# Patient Record
Sex: Female | Born: 1965 | Race: White | Hispanic: No | Marital: Single | Smoking: Never smoker
Health system: Southern US, Community
[De-identification: ages and names within clinical notes are randomized; demographics above are authoritative.]

## PROBLEM LIST (undated history)

## (undated) DIAGNOSIS — C50919 Malignant neoplasm of unspecified site of unspecified female breast: Secondary | ICD-10-CM

## (undated) DIAGNOSIS — G43909 Migraine, unspecified, not intractable, without status migrainosus: Secondary | ICD-10-CM

## (undated) DIAGNOSIS — I10 Essential (primary) hypertension: Secondary | ICD-10-CM

## (undated) DIAGNOSIS — F419 Anxiety disorder, unspecified: Secondary | ICD-10-CM

## (undated) DIAGNOSIS — R011 Cardiac murmur, unspecified: Secondary | ICD-10-CM

## (undated) DIAGNOSIS — K219 Gastro-esophageal reflux disease without esophagitis: Secondary | ICD-10-CM

## (undated) DIAGNOSIS — Z973 Presence of spectacles and contact lenses: Secondary | ICD-10-CM

## (undated) DIAGNOSIS — N201 Calculus of ureter: Secondary | ICD-10-CM

## (undated) DIAGNOSIS — Z9889 Other specified postprocedural states: Secondary | ICD-10-CM

## (undated) DIAGNOSIS — R112 Nausea with vomiting, unspecified: Secondary | ICD-10-CM

## (undated) DIAGNOSIS — Z923 Personal history of irradiation: Secondary | ICD-10-CM

## (undated) HISTORY — DX: Essential (primary) hypertension: I10

## (undated) HISTORY — DX: Migraine, unspecified, not intractable, without status migrainosus: G43.909

## (undated) HISTORY — PX: BASAL CELL CARCINOMA EXCISION: SHX1214

## (undated) HISTORY — DX: Malignant neoplasm of unspecified site of unspecified female breast: C50.919

## (undated) HISTORY — PX: COLONOSCOPY: SHX174

---

## 1999-08-15 ENCOUNTER — Other Ambulatory Visit: Admission: RE | Admit: 1999-08-15 | Discharge: 1999-08-15 | Payer: Self-pay | Admitting: Obstetrics and Gynecology

## 2000-09-24 ENCOUNTER — Other Ambulatory Visit: Admission: RE | Admit: 2000-09-24 | Discharge: 2000-09-24 | Payer: Self-pay | Admitting: Obstetrics and Gynecology

## 2001-10-13 ENCOUNTER — Other Ambulatory Visit: Admission: RE | Admit: 2001-10-13 | Discharge: 2001-10-13 | Payer: Self-pay | Admitting: Obstetrics and Gynecology

## 2002-12-01 ENCOUNTER — Other Ambulatory Visit: Admission: RE | Admit: 2002-12-01 | Discharge: 2002-12-01 | Payer: Self-pay | Admitting: Obstetrics and Gynecology

## 2003-12-24 ENCOUNTER — Other Ambulatory Visit: Admission: RE | Admit: 2003-12-24 | Discharge: 2003-12-24 | Payer: Self-pay | Admitting: Obstetrics and Gynecology

## 2005-02-25 ENCOUNTER — Other Ambulatory Visit: Admission: RE | Admit: 2005-02-25 | Discharge: 2005-02-25 | Payer: Self-pay | Admitting: Obstetrics and Gynecology

## 2011-10-27 ENCOUNTER — Other Ambulatory Visit: Payer: Self-pay | Admitting: Family Medicine

## 2011-10-27 DIAGNOSIS — R1013 Epigastric pain: Secondary | ICD-10-CM

## 2011-10-29 ENCOUNTER — Ambulatory Visit
Admission: RE | Admit: 2011-10-29 | Discharge: 2011-10-29 | Disposition: A | Discharge status: Home / Self Care | Payer: BC Managed Care – PPO | Source: Ambulatory Visit | Attending: Family Medicine | Admitting: Family Medicine

## 2011-10-29 DIAGNOSIS — R1013 Epigastric pain: Secondary | ICD-10-CM

## 2015-11-28 ENCOUNTER — Encounter (HOSPITAL_COMMUNITY): Payer: Self-pay | Admitting: Emergency Medicine

## 2015-11-28 ENCOUNTER — Emergency Department (HOSPITAL_COMMUNITY)
Admission: EM | Admit: 2015-11-28 | Discharge: 2015-11-29 | Disposition: A | Discharge status: Home / Self Care | Payer: BC Managed Care – PPO | Attending: Emergency Medicine | Admitting: Emergency Medicine

## 2015-11-28 DIAGNOSIS — Z3202 Encounter for pregnancy test, result negative: Secondary | ICD-10-CM | POA: Diagnosis not present

## 2015-11-28 DIAGNOSIS — K7689 Other specified diseases of liver: Secondary | ICD-10-CM | POA: Insufficient documentation

## 2015-11-28 DIAGNOSIS — N201 Calculus of ureter: Secondary | ICD-10-CM | POA: Insufficient documentation

## 2015-11-28 DIAGNOSIS — R1031 Right lower quadrant pain: Secondary | ICD-10-CM | POA: Diagnosis present

## 2015-11-28 LAB — DIFFERENTIAL
BASOS ABS: 0 10*3/uL (ref 0.0–0.1)
Basophils Relative: 0 %
EOS ABS: 0.5 10*3/uL (ref 0.0–0.7)
EOS PCT: 4 %
LYMPHS ABS: 2.1 10*3/uL (ref 0.7–4.0)
Lymphocytes Relative: 18 %
Monocytes Absolute: 0.5 10*3/uL (ref 0.1–1.0)
Monocytes Relative: 4 %
NEUTROS PCT: 74 %
Neutro Abs: 8.2 10*3/uL — ABNORMAL HIGH (ref 1.7–7.7)

## 2015-11-28 LAB — CBC
HEMATOCRIT: 37.2 % (ref 36.0–46.0)
HEMOGLOBIN: 12.4 g/dL (ref 12.0–15.0)
MCH: 29.9 pg (ref 26.0–34.0)
MCHC: 33.3 g/dL (ref 30.0–36.0)
MCV: 89.6 fL (ref 78.0–100.0)
Platelets: 255 10*3/uL (ref 150–400)
RBC: 4.15 MIL/uL (ref 3.87–5.11)
RDW: 12.1 % (ref 11.5–15.5)
WBC: 11.2 10*3/uL — AB (ref 4.0–10.5)

## 2015-11-28 LAB — POC URINE PREG, ED: PREG TEST UR: NEGATIVE

## 2015-11-28 MED ORDER — SODIUM CHLORIDE 0.9 % IV SOLN
1000.0000 mL | Freq: Once | INTRAVENOUS | Status: AC
  Administered 2015-11-29: 1000 mL via INTRAVENOUS

## 2015-11-28 MED ORDER — ONDANSETRON HCL 4 MG/2ML IJ SOLN
4.0000 mg | Freq: Once | INTRAMUSCULAR | Status: AC
  Administered 2015-11-29: 4 mg via INTRAVENOUS
  Filled 2015-11-28: qty 2

## 2015-11-28 MED ORDER — SODIUM CHLORIDE 0.9 % IV SOLN
1000.0000 mL | INTRAVENOUS | Status: DC
Start: 2015-11-29 — End: 2015-11-29
  Administered 2015-11-29: 1000 mL via INTRAVENOUS

## 2015-11-28 NOTE — ED Provider Notes (Signed)
CSN: [id number]     Arrival date & time 11/28/15  2316 History   By signing my name below, I, Forrestine Him, attest that this documentation has been prepared under the direction and in the presence of Delora Fuel, MD.  Electronically Signed: Forrestine Him, ED Scribe. 11/28/2015. 12:00 AM.   Chief Complaint  Patient presents with  . Abdominal Pain   The history is provided by the patient. No language interpreter was used.    HPI Comments: Wanda Castro is a 50 y.o. female without any pertinent past medical history who presents to the Emergency Department complaining of constant, ongoing RLQ abdominal pain that intermittently radiates to the back onset 9:00 PM this evening. Pt unable to describe pain but rates it 8-9/10. Pt also reports nausea and 4 episodes of vomiting since time of onset of abdominal pain. No aggravating factors on this time. However, she states abdominal discomfort is mildly improved after vomiting. No OTC medications or home remedies attempted prior to arrival. However, pt states she attempted to use a suppository but says she still has not been able to have a bowel movement. No fever, chills, diarrhea, chest pain, or shortness of breath. She is unsure of LNMP as she is currently going through menopause. No known allergies to medications.  PCP: Bevelyn Buckles, MD- [place]  History reviewed. No pertinent past medical history. History reviewed. No pertinent past surgical history. No family history on file. Social History  Substance Use Topics  . Smoking status: Never Smoker   . Smokeless tobacco: None  . Alcohol Use: No   OB History    No data available     Review of Systems  Constitutional: Negative for fever and chills.  Respiratory: Negative for shortness of breath.   Cardiovascular: Negative for chest pain.  Gastrointestinal: Positive for nausea, vomiting and abdominal pain.  Neurological: Negative for headaches.  Psychiatric/Behavioral:  Negative for confusion.  All other systems reviewed and are negative.     Allergies  Review of patient's allergies indicates no known allergies.  Home Medications   Prior to Admission medications   Not on File   Triage Vitals: BP 115/97 mmHg  Pulse 96  Temp(Src) 98.7 F (37.1 C) (Oral)  Resp 16  Wt 163 lb 4.8 oz (74.072 kg)  SpO2 96%   Physical Exam  Constitutional: She is oriented to person, place, and time. She appears well-developed and well-nourished. No distress.  Appears uncomfortable   HENT:  Head: Normocephalic and atraumatic.  Eyes: EOM are normal. Pupils are equal, round, and reactive to light.  Neck: Normal range of motion. Neck supple. No JVD present.  Cardiovascular: Normal rate, regular rhythm and normal heart sounds.   No murmur heard. Pulmonary/Chest: Effort normal and breath sounds normal. She has no wheezes. She has no rales. She exhibits no tenderness.  Abdominal: Soft. She exhibits no distension and no mass. There is tenderness. There is no rebound and no guarding.  Mild R suprapubic tenderness Bowel sounds decreased  Genitourinary:  No CVA tenderness noted   Musculoskeletal: Normal range of motion. She exhibits no edema.  Lymphadenopathy:    She has no cervical adenopathy.  Neurological: She is alert and oriented to person, place, and time. No cranial nerve deficit. She exhibits normal muscle tone. Coordination normal.  Skin: Skin is warm and dry. No rash noted.  Psychiatric: She has a normal mood and affect. Her behavior is normal. Judgment and thought content normal.  Nursing note and vitals reviewed.  ED Course  Procedures (including critical care time)  DIAGNOSTIC STUDIES: Oxygen Saturation is 96% on RA, adequate by my interpretation.    COORDINATION OF CARE: 11:57 PM- Will order imaging, blood work, and urine pregnancy. Will give fluids and Zofran. Discussed treatment plan with pt at bedside and pt agreed to plan.     Labs  Review Results for orders placed or performed during the hospital encounter of 11/28/15  Lipase, blood  Result Value Ref Range   Lipase 19 11 - 51 U/L  Comprehensive metabolic panel  Result Value Ref Range   Sodium 139 135 - 145 mmol/L   Potassium 3.3 (L) 3.5 - 5.1 mmol/L   Chloride 101 101 - 111 mmol/L   CO2 25 22 - 32 mmol/L   Glucose, Bld 156 (H) 65 - 99 mg/dL   BUN 17 6 - 20 mg/dL   Creatinine, Ser 1.00 0.44 - 1.00 mg/dL   Calcium 9.1 8.9 - 10.3 mg/dL   Total Protein 6.7 6.5 - 8.1 g/dL   Albumin 3.9 3.5 - 5.0 g/dL   AST 22 15 - 41 U/L   ALT 17 14 - 54 U/L   Alkaline Phosphatase 70 38 - 126 U/L   Total Bilirubin 0.7 0.3 - 1.2 mg/dL   GFR calc non Af Amer >60 >60 mL/min   GFR calc Af Amer >60 >60 mL/min   Anion gap 13 5 - 15  CBC  Result Value Ref Range   WBC 11.2 (H) 4.0 - 10.5 K/uL   RBC 4.15 3.87 - 5.11 MIL/uL   Hemoglobin 12.4 12.0 - 15.0 g/dL   HCT 37.2 36.0 - 46.0 %   MCV 89.6 78.0 - 100.0 fL   MCH 29.9 26.0 - 34.0 pg   MCHC 33.3 30.0 - 36.0 g/dL   RDW 12.1 11.5 - 15.5 %   Platelets 255 150 - 400 K/uL  Urinalysis, Routine w reflex microscopic  Result Value Ref Range   Color, Urine YELLOW YELLOW   APPearance CLEAR CLEAR   Specific Gravity, Urine 1.026 1.005 - 1.030   pH 5.5 5.0 - 8.0   Glucose, UA NEGATIVE NEGATIVE mg/dL   Hgb urine dipstick NEGATIVE NEGATIVE   Bilirubin Urine NEGATIVE NEGATIVE   Ketones, ur 15 (A) NEGATIVE mg/dL   Protein, ur NEGATIVE NEGATIVE mg/dL   Nitrite NEGATIVE NEGATIVE   Leukocytes, UA NEGATIVE NEGATIVE  Differential  Result Value Ref Range   Neutrophils Relative % 74 %   Neutro Abs 8.2 (H) 1.7 - 7.7 K/uL   Lymphocytes Relative 18 %   Lymphs Abs 2.1 0.7 - 4.0 K/uL   Monocytes Relative 4 %   Monocytes Absolute 0.5 0.1 - 1.0 K/uL   Eosinophils Relative 4 %   Eosinophils Absolute 0.5 0.0 - 0.7 K/uL   Basophils Relative 0 %   Basophils Absolute 0.0 0.0 - 0.1 K/uL  POC urine preg, ED  Result Value Ref Range   Preg Test, Ur  NEGATIVE NEGATIVE   Imaging Review Ct Abdomen Pelvis W Contrast  11/29/2015  CLINICAL DATA:  50 year old female with right lower quadrant abdominal pain. EXAM: CT ABDOMEN AND PELVIS WITH CONTRAST TECHNIQUE: Multidetector CT imaging of the abdomen and pelvis was performed using the standard protocol following bolus administration of intravenous contrast. CONTRAST:  100 cc Isovue-300 COMPARISON:  Renal ultrasound dated 10/07/2007 FINDINGS: Minimal bibasilar dependent atelectatic changes of the lungs. The visualized lung bases are otherwise clear. There is no intra-abdominal free air or free fluid. A 1.5 cm  left hepatic hypodense lesion, incompletely characterized, most likely a cyst or hemangioma. MRI may provide better characterization. This is the liver is otherwise unremarkable. The gallbladder,Pancreas, spleen, and the adrenal glands appear unremarkable. Set There is a 3 mm distal right ureteral calculus with mild right hydronephrosis. There is asymmetric delayed excretion of contrast by the right kidney. The left kidney is unremarkable. The visualized left ureter, and urinary bladder appear unremarkable. The uterus is anteverted and grossly unremarkable. The visualized left ovary is grossly unremarkable. There is no evidence of bowel obstruction or active inflammation. Normal appendix. The abdominal aorta and IVC appear unremarkable. No portal venous gas identified. There is no adenopathy. There is a small fat containing umbilical hernia. The abdominal wall soft tissues are otherwise unremarkable. The osseous structures are intact. IMPRESSION: A 3 mm distal right ureteral stone with mild right hydronephrosis. Electronically Signed   By: Anner Crete M.D.   On: 11/29/2015 01:10   I have personally reviewed and evaluated these images and lab results as part of my medical decision-making.   MDM   Final diagnoses:  Ureterolithiasis  Hepatic cyst    Right lower quadrant pain of uncertain cause.  Patient is postmenopausal, so ovarian cyst and torsion are very unlikely. Diagnostic possibilities include appendicitis, renal colic, diverticulitis. She'll be given IV hydration, morphine, ondansetron and sent for CT of abdomen and pelvis. Old records are reviewed and she has no relevant past visits.  She had moderate relief of pain with morphine. CT shows a 3 mm distal right ureteral calculus with mild hydronephrosis which clearly is the cause of her symptoms. She is given a dose of ketorolac with further improvement in pain. Incidental finding of hepatic cysts. Note is made of radiologist stating MRI can further characterize the cyst. At this point, do not feel this is emergent and she is referred back to her primary care provider. She is referred to urology for follow-up for her kidney stone. She is discharged with prescriptions for oxycodone have acetaminophen, ondansetron, tamsulosin and advised to take over-the-counter naproxen.  I personally performed the services described in this documentation, which was scribed in my presence. The recorded information has been reviewed and is accurate.      Delora Fuel, [place] [id number]

## 2015-11-28 NOTE — ED Notes (Signed)
Pt. reports RLQ pain with nausea and emesis onset 9 pm this evening , denies dysuria or fever .

## 2015-11-29 ENCOUNTER — Emergency Department (HOSPITAL_COMMUNITY): Payer: BC Managed Care – PPO

## 2015-11-29 LAB — COMPREHENSIVE METABOLIC PANEL
ALBUMIN: 3.9 g/dL (ref 3.5–5.0)
ALK PHOS: 70 U/L (ref 38–126)
ALT: 17 U/L (ref 14–54)
ANION GAP: 13 (ref 5–15)
AST: 22 U/L (ref 15–41)
BILIRUBIN TOTAL: 0.7 mg/dL (ref 0.3–1.2)
BUN: 17 mg/dL (ref 6–20)
CALCIUM: 9.1 mg/dL (ref 8.9–10.3)
CO2: 25 mmol/L (ref 22–32)
CREATININE: 1 mg/dL (ref 0.44–1.00)
Chloride: 101 mmol/L (ref 101–111)
GFR calc Af Amer: >60 mL/min (ref >60)
GFR calc non Af Amer: >60 mL/min (ref >60)
GLUCOSE: 156 mg/dL — AB (ref 65–99)
POTASSIUM: 3.3 mmol/L — AB (ref 3.5–5.1)
SODIUM: 139 mmol/L (ref 135–145)
Total Protein: 6.7 g/dL (ref 6.5–8.1)

## 2015-11-29 LAB — URINALYSIS, ROUTINE W REFLEX MICROSCOPIC
BILIRUBIN URINE: NEGATIVE
GLUCOSE, UA: NEGATIVE mg/dL
Hgb urine dipstick: NEGATIVE
KETONES UR: 15 mg/dL — AB
Leukocytes, UA: NEGATIVE
Nitrite: NEGATIVE
PH: 5.5 (ref 5.0–8.0)
Protein, ur: NEGATIVE mg/dL
Specific Gravity, Urine: 1.026 (ref 1.005–1.030)

## 2015-11-29 LAB — LIPASE, BLOOD: Lipase: 19 U/L (ref 11–51)

## 2015-11-29 MED ORDER — MORPHINE SULFATE (PF) 4 MG/ML IV SOLN
4.0000 mg | INTRAVENOUS | Status: DC | PRN
  Administered 2015-11-29 (×2): 4 mg via INTRAVENOUS
  Filled 2015-11-29 (×2): qty 1

## 2015-11-29 MED ORDER — IOPAMIDOL (ISOVUE-300) INJECTION 61%
INTRAVENOUS | Status: AC
  Administered 2015-11-29: 100 mL
  Filled 2015-11-29: qty 100

## 2015-11-29 MED ORDER — TAMSULOSIN HCL 0.4 MG PO CAPS: 0.4000 mg | ORAL_CAPSULE | Freq: Every day | ORAL | Status: DC

## 2015-11-29 MED ORDER — OXYCODONE-ACETAMINOPHEN 5-325 MG PO TABS: 1.0000 | ORAL_TABLET | ORAL | Status: DC | PRN

## 2015-11-29 MED ORDER — ONDANSETRON HCL 4 MG PO TABS: 4.0000 mg | ORAL_TABLET | Freq: Four times a day (QID) | ORAL | Status: DC | PRN

## 2015-11-29 MED ORDER — KETOROLAC TROMETHAMINE 30 MG/ML IJ SOLN
30.0000 mg | Freq: Once | INTRAMUSCULAR | Status: AC
  Administered 2015-11-29: 30 mg via INTRAVENOUS
  Filled 2015-11-29: qty 1

## 2015-11-29 NOTE — Discharge Instructions (Signed)
Take to naproxen tablets twice a day.  Your CT scan showed a cyst on your liver. This is most likely benign. Please talk with your primary care provider regarding whether any additional testing should be done.  Kidney Stones Kidney stones (urolithiasis) are deposits that form inside your kidneys. The intense pain is caused by the stone moving through the urinary tract. When the stone moves, the ureter goes into spasm around the stone. The stone is usually passed in the urine.  CAUSES   A disorder that makes certain neck glands produce too much parathyroid hormone (primary hyperparathyroidism).  A buildup of uric acid crystals, similar to gout in your joints.  Narrowing (stricture) of the ureter.  A kidney obstruction present at birth (congenital obstruction).  Previous surgery on the kidney or ureters.  Numerous kidney infections. SYMPTOMS   Feeling sick to your stomach (nauseous).  Throwing up (vomiting).  Blood in the urine (hematuria).  Pain that usually spreads (radiates) to the groin.  Frequency or urgency of urination. DIAGNOSIS   Taking a history and physical exam.  Blood or urine tests.  CT scan.  Occasionally, an examination of the inside of the urinary bladder (cystoscopy) is performed. TREATMENT   Observation.  Increasing your fluid intake.  Extracorporeal shock wave lithotripsy--This is a noninvasive procedure that uses shock waves to break up kidney stones.  Surgery may be needed if you have severe pain or persistent obstruction. There are various surgical procedures. Most of the procedures are performed with the use of small instruments. Only small incisions are needed to accommodate these instruments, so recovery time is minimized. The size, location, and chemical composition are all important variables that will determine the proper choice of action for you. Talk to your health care provider to better understand your situation so that you will minimize  the risk of injury to yourself and your kidney.  HOME CARE INSTRUCTIONS   Drink enough water and fluids to keep your urine clear or pale yellow. This will help you to pass the stone or stone fragments.  Strain all urine through the provided strainer. Keep all particulate matter and stones for your health care provider to see. The stone causing the pain may be as small as a grain of salt. It is very important to use the strainer each and every time you pass your urine. The collection of your stone will allow your health care provider to analyze it and verify that a stone has actually passed. The stone analysis will often identify what you can do to reduce the incidence of recurrences.  Only take over-the-counter or prescription medicines for pain, discomfort, or fever as directed by your health care provider.  Keep all follow-up visits as told by your health care provider. This is important.  Get follow-up X-rays if required. The absence of pain does not always mean that the stone has passed. It may have only stopped moving. If the urine remains completely obstructed, it can cause loss of kidney function or even complete destruction of the kidney. It is your responsibility to make sure X-rays and follow-ups are completed. Ultrasounds of the kidney can show blockages and the status of the kidney. Ultrasounds are not associated with any radiation and can be performed easily in a matter of minutes.  Make changes to your daily diet as told by your health care provider. You may be told to:  Limit the amount of salt that you eat.  Eat 5 or more servings of fruits and  vegetables each day.  Limit the amount of meat, poultry, fish, and eggs that you eat.  Collect a 24-hour urine sample as told by your health care provider.You may need to collect another urine sample every 6-12 months. SEEK MEDICAL CARE IF:  You experience pain that is progressive and unresponsive to any pain medicine you have been  prescribed. SEEK IMMEDIATE MEDICAL CARE IF:   Pain cannot be controlled with the prescribed medicine.  You have a fever or shaking chills.  The severity or intensity of pain increases over 18 hours and is not relieved by pain medicine.  You develop a new onset of abdominal pain.  You feel faint or pass out.  You are unable to urinate.   This information is not intended to replace advice given to you by your health care provider. Make sure you discuss any questions you have with your health care provider.   Document Released: 06/29/2005 Document Revised: 03/20/2015 Document Reviewed: 11/30/2012 Elsevier Interactive Patient Education 2016 [employer]  Acetaminophen; Oxycodone tablets What is this medicine? ACETAMINOPHEN; OXYCODONE (a set a MEE noe fen; ox i KOE done) is a pain reliever. It is used to treat moderate to severe pain. This medicine may be used for other purposes; ask your health care provider or pharmacist if you have questions. What should I tell my health care provider before I take this medicine? They need to know if you have any of these conditions: -brain tumor -Crohn's disease, inflammatory bowel disease, or ulcerative colitis -drug abuse or addiction -head injury -heart or circulation problems -if you often drink alcohol -kidney disease or problems going to the bathroom -liver disease -lung disease, asthma, or breathing problems -an unusual or allergic reaction to acetaminophen, oxycodone, other opioid analgesics, other medicines, foods, dyes, or preservatives -pregnant or trying to get pregnant -breast-feeding How should I use this medicine? Take this medicine by mouth with a full glass of water. Follow the directions on the prescription label. You can take it with or without food. If it upsets your stomach, take it with food. Take your medicine at regular intervals. Do not take it more often than directed. Talk to your pediatrician regarding the use of  this medicine in children. Special care may be needed. Patients over 50 years old may have a stronger reaction and need a smaller dose. Overdosage: If you think you have taken too much of this medicine contact a poison control center or emergency room at once. NOTE: This medicine is only for you. Do not share this medicine with others. What if I miss a dose? If you miss a dose, take it as soon as you can. If it is almost time for your next dose, take only that dose. Do not take double or extra doses. What may interact with this medicine? -alcohol -antihistamines -barbiturates like amobarbital, butalbital, butabarbital, methohexital, pentobarbital, phenobarbital, thiopental, and secobarbital -benztropine -drugs for bladder problems like solifenacin, trospium, oxybutynin, tolterodine, hyoscyamine, and methscopolamine -drugs for breathing problems like ipratropium and tiotropium -drugs for certain stomach or intestine problems like propantheline, homatropine methylbromide, glycopyrrolate, atropine, belladonna, and dicyclomine -general anesthetics like etomidate, ketamine, nitrous oxide, propofol, desflurane, enflurane, halothane, isoflurane, and sevoflurane -medicines for depression, anxiety, or psychotic disturbances -medicines for sleep -muscle relaxants -naltrexone -narcotic medicines (opiates) for pain -phenothiazines like perphenazine, thioridazine, chlorpromazine, mesoridazine, fluphenazine, prochlorperazine, promazine, and trifluoperazine -scopolamine -tramadol -trihexyphenidyl This list may not describe all possible interactions. Give your health care provider a list of all the medicines, herbs, non-prescription drugs,  or dietary supplements you use. Also tell them if you smoke, drink alcohol, or use illegal drugs. Some items may interact with your medicine. What should I watch for while using this medicine? Tell your doctor or health care professional if your pain does not go away, if  it gets worse, or if you have new or a different type of pain. You may develop tolerance to the medicine. Tolerance means that you will need a higher dose of the medication for pain relief. Tolerance is normal and is expected if you take this medicine for a long time. Do not suddenly stop taking your medicine because you may develop a severe reaction. Your body becomes used to the medicine. This does NOT mean you are addicted. Addiction is a behavior related to getting and using a drug for a non-medical reason. If you have pain, you have a medical reason to take pain medicine. Your doctor will tell you how much medicine to take. If your doctor wants you to stop the medicine, the dose will be slowly lowered over time to avoid any side effects. You may get drowsy or dizzy. Do not drive, use machinery, or do anything that needs mental alertness until you know how this medicine affects you. Do not stand or sit up quickly, especially if you are an older patient. This reduces the risk of dizzy or fainting spells. Alcohol may interfere with the effect of this medicine. Avoid alcoholic drinks. There are different types of narcotic medicines (opiates) for pain. If you take more than one type at the same time, you may have more side effects. Give your health care provider a list of all medicines you use. Your doctor will tell you how much medicine to take. Do not take more medicine than directed. Call emergency for help if you have problems breathing. The medicine will cause constipation. Try to have a bowel movement at least every 2 to 3 days. If you do not have a bowel movement for 3 days, call your doctor or health care professional. Do not take Tylenol (acetaminophen) or medicines that have acetaminophen with this medicine. Too much acetaminophen can be very dangerous. Many nonprescription medicines contain acetaminophen. Always read the labels carefully to avoid taking more acetaminophen. What side effects may I  notice from receiving this medicine? Side effects that you should report to your doctor or health care professional as soon as possible: -allergic reactions like skin rash, itching or hives, swelling of the face, lips, or tongue -breathing difficulties, wheezing -confusion -light headedness or fainting spells -severe stomach pain -unusually weak or tired -yellowing of the skin or the whites of the eyes Side effects that usually do not require medical attention (report to your doctor or health care professional if they continue or are bothersome): -dizziness -drowsiness -nausea -vomiting This list may not describe all possible side effects. Call your doctor for medical advice about side effects. You may report side effects to FDA at 1-800-FDA-1088. Where should I keep my medicine? Keep out of the reach of children. This medicine can be abused. Keep your medicine in a safe place to protect it from theft. Do not share this medicine with anyone. Selling or giving away this medicine is dangerous and against the law. This medicine may cause accidental overdose and death if it taken by other adults, children, or pets. Mix any unused medicine with a substance like cat litter or coffee grounds. Then throw the medicine away in a sealed container like a sealed bag  or a coffee can with a lid. Do not use the medicine after the expiration date. Store at room temperature between 20 and 25 degrees C (68 and 77 degrees F). NOTE: This sheet is a summary. It may not cover all possible information. If you have questions about this medicine, talk to your doctor, pharmacist, or health care provider.    2016, [employer]. (2014-05-30 15:18:46)  Ondansetron tablets What is this medicine? ONDANSETRON (on DAN se tron) is used to treat nausea and vomiting caused by chemotherapy. It is also used to prevent or treat nausea and vomiting after surgery. This medicine may be used for other purposes; ask your  health care provider or pharmacist if you have questions. What should I tell my health care provider before I take this medicine? They need to know if you have any of these conditions: -heart disease -history of irregular heartbeat -liver disease -low levels of magnesium or potassium in the blood -an unusual or allergic reaction to ondansetron, granisetron, other medicines, foods, dyes, or preservatives -pregnant or trying to get pregnant -breast-feeding How should I use this medicine? Take this medicine by mouth with a glass of water. Follow the directions on your prescription label. Take your doses at regular intervals. Do not take your medicine more often than directed. Talk to your pediatrician regarding the use of this medicine in children. Special care may be needed. Overdosage: If you think you have taken too much of this medicine contact a poison control center or emergency room at once. NOTE: This medicine is only for you. Do not share this medicine with others. What if I miss a dose? If you miss a dose, take it as soon as you can. If it is almost time for your next dose, take only that dose. Do not take double or extra doses. What may interact with this medicine? Do not take this medicine with any of the following medications: -apomorphine -certain medicines for fungal infections like fluconazole, itraconazole, ketoconazole, posaconazole, voriconazole -cisapride -dofetilide -dronedarone -pimozide -thioridazine -ziprasidone This medicine may also interact with the following medications: -carbamazepine -certain medicines for depression, anxiety, or psychotic disturbances -fentanyl -linezolid -MAOIs like Carbex, Eldepryl, Marplan, Nardil, and Parnate -methylene blue (injected into a vein) -other medicines that prolong the QT interval (cause an abnormal heart rhythm) -phenytoin -rifampicin -tramadol This list may not describe all possible interactions. Give your health  care provider a list of all the medicines, herbs, non-prescription drugs, or dietary supplements you use. Also tell them if you smoke, drink alcohol, or use illegal drugs. Some items may interact with your medicine. What should I watch for while using this medicine? Check with your doctor or health care professional right away if you have any sign of an allergic reaction. What side effects may I notice from receiving this medicine? Side effects that you should report to your doctor or health care professional as soon as possible: -allergic reactions like skin rash, itching or hives, swelling of the face, lips or tongue -breathing problems -confusion -dizziness -fast or irregular heartbeat -feeling faint or lightheaded, falls -fever and chills -loss of balance or coordination -seizures -sweating -swelling of the hands or feet -tightness in the chest -tremors -unusually weak or tired Side effects that usually do not require medical attention (report to your doctor or health care professional if they continue or are bothersome): -constipation or diarrhea -headache This list may not describe all possible side effects. Call your doctor for medical advice about side effects. You may  report side effects to FDA at 1-800-FDA-1088. Where should I keep my medicine? Keep out of the reach of children. Store between 2 and 30 degrees C (36 and 86 degrees F). Throw away any unused medicine after the expiration date. NOTE: This sheet is a summary. It may not cover all possible information. If you have questions about this medicine, talk to your doctor, pharmacist, or health care provider.    2016, [employer] Standard. (2013-04-05 16:27:45)  Tamsulosin capsules What is this medicine? TAMSULOSIN (tam SOO loe sin) is used to treat enlargement of the prostate gland in men, a condition called benign prostatic hyperplasia or BPH. It is not for use in women. It works by relaxing muscles in the prostate  and bladder neck. This improves urine flow and reduces BPH symptoms. This medicine may be used for other purposes; ask your health care provider or pharmacist if you have questions. What should I tell my health care provider before I take this medicine? They need to know if you have any of the following conditions: -advanced kidney disease -advanced liver disease -low blood pressure -prostate cancer -an unusual or allergic reaction to tamsulosin, sulfa drugs, other medicines, foods, dyes, or preservatives -pregnant or trying to get pregnant -breast-feeding How should I use this medicine? Take this medicine by mouth about 30 minutes after the same meal every day. Follow the directions on the prescription label. Swallow the capsules whole with a glass of water. Do not crush, chew, or open capsules. Do not take your medicine more often than directed. Do not stop taking your medicine unless your doctor tells you to. Talk to your pediatrician regarding the use of this medicine in children. Special care may be needed. Overdosage: If you think you have taken too much of this medicine contact a poison control center or emergency room at once. NOTE: This medicine is only for you. Do not share this medicine with others. What if I miss a dose? If you miss a dose, take it as soon as you can. If it is almost time for your next dose, take only that dose. Do not take double or extra doses. If you stop taking your medicine for several days or more, ask your doctor or health care professional what dose you should start back on. What may interact with this medicine? -cimetidine -fluoxetine -ketoconazole -medicines for erectile disfunction like sildenafil, tadalafil, vardenafil -medicines for high blood pressure -other alpha-blockers like alfuzosin, doxazosin, phentolamine, phenoxybenzamine, prazosin, terazosin -warfarin This list may not describe all possible interactions. Give your health care provider a  list of all the medicines, herbs, non-prescription drugs, or dietary supplements you use. Also tell them if you smoke, drink alcohol, or use illegal drugs. Some items may interact with your medicine. What should I watch for while using this medicine? Visit your doctor or health care professional for regular check ups. You will need lab work done before you start this medicine and regularly while you are taking it. Check your blood pressure as directed. Ask your health care professional what your blood pressure should be, and when you should contact him or her. This medicine may make you feel dizzy or lightheaded. This is more likely to happen after the first dose, after an increase in dose, or during hot weather or exercise. Drinking alcohol and taking some medicines can make this worse. Do not drive, use machinery, or do anything that needs mental alertness until you know how this medicine affects you. Do not sit or stand  up quickly. If you begin to feel dizzy, sit down until you feel better. These effects can decrease once your body adjusts to the medicine. Contact your doctor or health care professional right away if you have an erection that lasts longer than 4 hours or if it becomes painful. This may be a sign of a serious problem and must be treated right away to prevent permanent damage. If you are thinking of having cataract surgery, tell your eye surgeon that you have taken this medicine. What side effects may I notice from receiving this medicine? Side effects that you should report to your doctor or health care professional as soon as possible: -allergic reactions like skin rash or itching, hives, swelling of the lips, mouth, tongue, or throat -breathing problems -change in vision -feeling faint or lightheaded -irregular heartbeat -prolonged or painful erection -weakness Side effects that usually do not require medical attention (report to your doctor or health care professional if they  continue or are bothersome): -back pain -change in sex drive or performance -constipation, nausea or vomiting -cough -drowsy -runny or stuffy nose -trouble sleeping This list may not describe all possible side effects. Call your doctor for medical advice about side effects. You may report side effects to FDA at 1-800-FDA-1088. Where should I keep my medicine? Keep out of the reach of children. Store at room temperature between 15 and 30 degrees C (59 and 86 degrees F). Throw away any unused medicine after the expiration date. NOTE: This sheet is a summary. It may not cover all possible information. If you have questions about this medicine, talk to your doctor, pharmacist, or health care provider.    2016, [employer]. (2012-06-29 14:11:34)

## 2015-11-29 NOTE — ED Notes (Signed)
Pt stable, ambulatory, states understanding of discharge instructions 

## 2015-12-02 ENCOUNTER — Other Ambulatory Visit: Payer: Self-pay | Admitting: Internal Medicine

## 2015-12-02 DIAGNOSIS — K7689 Other specified diseases of liver: Secondary | ICD-10-CM

## 2015-12-03 ENCOUNTER — Encounter (HOSPITAL_BASED_OUTPATIENT_CLINIC_OR_DEPARTMENT_OTHER): Payer: Self-pay | Admitting: *Deleted

## 2015-12-03 ENCOUNTER — Other Ambulatory Visit: Payer: Self-pay | Admitting: Urology

## 2015-12-04 ENCOUNTER — Encounter (HOSPITAL_BASED_OUTPATIENT_CLINIC_OR_DEPARTMENT_OTHER): Payer: Self-pay | Admitting: *Deleted

## 2015-12-04 NOTE — Progress Notes (Signed)
NPO AFTER MN WITH EXCEPTION CLEAR LIQUIDS UNTIL 0830 (NO CREAM / MILK PRODUCTS).  ARRIVE AT 1300.   NEEDS EKG.  CURRENT LAB RESULTS IN CHART AND EPIC.  WILL TAKE PRILOSEC AND FLOMAX AM DOS W/ SIPS OF WATER AND IF NEEDED TAKE PAIN/ NAUSEA RX.

## 2015-12-06 ENCOUNTER — Ambulatory Visit (HOSPITAL_BASED_OUTPATIENT_CLINIC_OR_DEPARTMENT_OTHER): Admission: RE | Admit: 2015-12-06 | Payer: BC Managed Care – PPO | Source: Ambulatory Visit | Admitting: Urology

## 2015-12-06 HISTORY — DX: Essential (primary) hypertension: I10

## 2015-12-06 HISTORY — DX: Presence of spectacles and contact lenses: Z97.3

## 2015-12-06 HISTORY — DX: Gastro-esophageal reflux disease without esophagitis: K21.9

## 2015-12-06 HISTORY — DX: Calculus of ureter: N20.1

## 2015-12-06 SURGERY — CYSTOURETEROSCOPY, WITH RETROGRADE PYELOGRAM AND STENT INSERTION
Anesthesia: General | Laterality: Right

## 2015-12-08 ENCOUNTER — Ambulatory Visit
Admission: RE | Admit: 2015-12-08 | Discharge: 2015-12-08 | Disposition: A | Discharge status: Home / Self Care | Payer: BC Managed Care – PPO | Source: Ambulatory Visit | Attending: Internal Medicine | Admitting: Internal Medicine

## 2015-12-08 DIAGNOSIS — K7689 Other specified diseases of liver: Secondary | ICD-10-CM

## 2015-12-08 MED ORDER — GADOBENATE DIMEGLUMINE 529 MG/ML IV SOLN
15.0000 mL | Freq: Once | INTRAVENOUS | Status: AC | PRN
  Administered 2015-12-08: 15 mL via INTRAVENOUS

## 2017-03-09 ENCOUNTER — Other Ambulatory Visit: Payer: BC Managed Care – PPO

## 2017-03-09 ENCOUNTER — Encounter: Payer: BC Managed Care – PPO | Admitting: Genetics

## 2019-06-03 ENCOUNTER — Other Ambulatory Visit: Payer: Self-pay

## 2019-06-03 DIAGNOSIS — Z20822 Contact with and (suspected) exposure to covid-19: Secondary | ICD-10-CM

## 2019-06-05 LAB — NOVEL CORONAVIRUS, NAA: SARS-CoV-2, NAA: NOT DETECTED

## 2019-06-15 ENCOUNTER — Other Ambulatory Visit: Payer: Self-pay | Admitting: Obstetrics and Gynecology

## 2019-06-15 DIAGNOSIS — R928 Other abnormal and inconclusive findings on diagnostic imaging of breast: Secondary | ICD-10-CM

## 2019-06-21 ENCOUNTER — Other Ambulatory Visit: Payer: Self-pay

## 2019-06-21 ENCOUNTER — Ambulatory Visit
Admission: RE | Admit: 2019-06-21 | Discharge: 2019-06-21 | Disposition: A | Discharge status: Home / Self Care | Payer: BC Managed Care – PPO | Source: Ambulatory Visit | Attending: Obstetrics and Gynecology | Admitting: Obstetrics and Gynecology

## 2019-06-21 DIAGNOSIS — I10 Essential (primary) hypertension: Secondary | ICD-10-CM | POA: Insufficient documentation

## 2019-06-21 DIAGNOSIS — R928 Other abnormal and inconclusive findings on diagnostic imaging of breast: Secondary | ICD-10-CM

## 2019-06-21 DIAGNOSIS — G43909 Migraine, unspecified, not intractable, without status migrainosus: Secondary | ICD-10-CM | POA: Insufficient documentation

## 2019-09-18 ENCOUNTER — Other Ambulatory Visit: Payer: BC Managed Care – PPO

## 2019-09-20 ENCOUNTER — Ambulatory Visit: Payer: BC Managed Care – PPO | Attending: Internal Medicine

## 2019-09-20 DIAGNOSIS — Z20822 Contact with and (suspected) exposure to covid-19: Secondary | ICD-10-CM

## 2019-09-21 LAB — NOVEL CORONAVIRUS, NAA: SARS-CoV-2, NAA: NOT DETECTED

## 2019-10-17 ENCOUNTER — Other Ambulatory Visit: Payer: Self-pay

## 2019-10-17 ENCOUNTER — Ambulatory Visit (INDEPENDENT_AMBULATORY_CARE_PROVIDER_SITE_OTHER): Payer: BC Managed Care – PPO | Admitting: Otolaryngology

## 2019-10-17 VITALS — Temp 98.1°F

## 2019-10-17 DIAGNOSIS — H6123 Impacted cerumen, bilateral: Secondary | ICD-10-CM | POA: Diagnosis not present

## 2019-10-17 NOTE — Progress Notes (Signed)
HPI: Wanda Castro is a 54 y.o. female who presents for evaluation of ear wax buildup in both ear canals..  She was last cleaned in 2016.  Past Medical History:  Diagnosis Date  . GERD (gastroesophageal reflux disease)   . Hypertension   . Right ureteral stone   . Wears contact lenses    Past Surgical History:  Procedure Laterality Date  . COLONOSCOPY  2015 approx   Social History   Socioeconomic History  . Marital status: Single    Spouse name: Not on file  . Number of children: Not on file  . Years of education: Not on file  . Highest education level: Not on file  Occupational History  . Not on file  Tobacco Use  . Smoking status: Never Smoker  . Smokeless tobacco: Never Used  Substance and Sexual Activity  . Alcohol use: No  . Drug use: No  . Sexual activity: Not on file  Other Topics Concern  . Not on file  Social History Narrative  . Not on file   Social Determinants of Health   Financial Resource Strain:   . Difficulty of Paying Living Expenses:   Food Insecurity:   . Worried About Charity fundraiser in the Last Year:   . Arboriculturist in the Last Year:   Transportation Needs:   . Film/video editor (Medical):   Marland Kitchen Lack of Transportation (Non-Medical):   Physical Activity:   . Days of Exercise per Week:   . Minutes of Exercise per Session:   Stress:   . Feeling of Stress :   Social Connections:   . Frequency of Communication with Friends and Family:   . Frequency of Social Gatherings with Friends and Family:   . Attends Religious Services:   . Active Member of Clubs or Organizations:   . Attends Archivist Meetings:   Marland Kitchen Marital Status:    No family history on file. Allergies  Allergen Reactions  . Adhesive [Tape] Other (See Comments)    Red irritated skin   Prior to Admission medications   Medication Sig Start Date End Date Taking? Authorizing Provider  Calcium Carb-Cholecalciferol (CALCIUM 600 + D PO) Take 1 tablet by mouth  daily.   Yes [provider]  losartan-hydrochlorothiazide (HYZAAR) 100-12.5 MG tablet Take 0.5 tablets by mouth every morning.  09/24/15  Yes [provider]  Multiple Vitamin (MULTIVITAMIN WITH MINERALS) TABS tablet Take 1 tablet by mouth daily.   Yes [provider]  omeprazole (PRILOSEC) 40 MG capsule Take 40 mg by mouth 2 (two) times daily as needed (for stomach).  09/24/15  Yes [provider]  ondansetron (ZOFRAN) 4 MG tablet Take 1 tablet (4 mg total) by mouth every 6 (six) hours as needed for nausea or vomiting. 0000000  Yes Delora Fuel, MD  oxyCODONE-acetaminophen (PERCOCET) 5-325 MG tablet Take 1 tablet by mouth every 4 (four) hours as needed for moderate pain. 0000000  Yes Delora Fuel, MD  tamsulosin (FLOMAX) 0.4 MG CAPS capsule Take 1 capsule (0.4 mg total) by mouth daily. Patient taking differently: Take 0.4 mg by mouth daily after breakfast.  0000000  Yes Delora Fuel, MD     Positive ROS: Otherwise negative  All other systems have been reviewed and were otherwise negative with the exception of those mentioned in the HPI and as above.  Physical Exam: Constitutional: Alert, well-appearing, no acute distress Ears: External ears without lesions or tenderness. Ear canals she has large  amount of cerumen impacting both ear canals that was removed with suction and curettes.  TMs are clear bilaterally.. Nasal: External nose without lesions. Clear nasal passages Oral: Oropharynx clear. Neck: No palpable adenopathy or masses Respiratory: Breathing comfortably  Skin: No facial/neck lesions or rash noted.  Cerumen impaction removal  Date/Time: 10/17/2019 5:17 PM Performed by: Rozetta Nunnery, MD Authorized by: Rozetta Nunnery, MD   Consent:    Consent obtained:  Verbal   Consent given by:  Patient   Risks discussed:  Pain and bleeding Procedure details:    Location:  L ear and R ear   Procedure type: curette and suction    Post-procedure details:    Inspection:  TM intact and canal normal   Hearing quality:  Improved   Patient tolerance of procedure:  Tolerated well, no immediate complications Comments:     TMs are clear bilaterally.    Assessment: Bilateral cerumen impactions  Plan: This was cleaned in the office. She will follow-up as needed  Radene Journey, MD

## 2020-04-17 NOTE — Progress Notes (Signed)
Chief Complaint  Patient presents with  . New Patient (Initial Visit)    Syncope    History of Present Illness: 54 yo female with history of migraines, HTN and GERD who is here today as a new patient for the evaluation of syncope. She had a migraine headache back about two months ago and took a Goody[employer] powder followed by a Vicodin. 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 She felt hot before she passed out  She has had no further syncopal events. No palpitations. No lower extremity edema. She has no chest pain or dyspnea with exertion. She has never been a smoker. She is very active and has no dizziness with activity.   Primary Care Physician: Leanna Battles, MD   Past Medical History:  Diagnosis Date  . GERD (gastroesophageal reflux disease)   . Hypertension   . Migraine   . Right ureteral stone   . Wears contact lenses     Past Surgical History:  Procedure Laterality Date  . COLONOSCOPY  2015 approx    Current Outpatient Medications  Medication Sig Dispense Refill  . Calcium Carb-Cholecalciferol (CALCIUM 600 + D PO) Take 1 tablet by mouth daily.    Marland Kitchen losartan-hydrochlorothiazide (HYZAAR) 100-12.5 MG tablet Take 0.5 tablets by mouth every morning.     . Multiple Vitamin (MULTIVITAMIN WITH MINERALS) TABS tablet Take 1 tablet by mouth daily.    Marland Kitchen omeprazole (PRILOSEC) 40 MG capsule Take 40 mg by mouth 2 (two) times daily as needed (for stomach).     . ondansetron (ZOFRAN) 4 MG tablet Take 1 tablet (4 mg total) by mouth every 6 (six) hours as needed for nausea or vomiting. 12 tablet 0  . oxyCODONE-acetaminophen (PERCOCET) 5-325 MG tablet Take 1 tablet by mouth every 4 (four) hours as needed for moderate pain. 20 tablet 0  . tamsulosin (FLOMAX) 0.4 MG CAPS capsule Take 1 capsule (0.4 mg total) by mouth daily. (Patient taking differently: Take 0.4 mg by mouth daily after breakfast. ) 10 capsule 0   No current facility-administered medications for this visit.      Allergies  Allergen Reactions  . Adhesive [Tape] Other (See Comments)    Red irritated skin    Social History   Socioeconomic History  . Marital status: Single    Spouse name: Not on file  . Number of children: Not on file  . Years of education: Not on file  . Highest education level: Not on file  Occupational History  . 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 Smoking status: Never Smoker  . Smokeless tobacco: Never Used  Substance and Sexual Activity  . Alcohol use: No  . Drug use: No  . Sexual activity: Not on file  Other Topics Concern  . Not on file  Social History Narrative  . Not on file   Social Determinants of Health   Financial Resource Strain:   . Difficulty of Paying Living Expenses: Not on file  Food Insecurity:   . Worried About Charity fundraiser in the Last Year: Not on file  . Ran Out of Food in the Last Year: Not on file  Transportation Needs:   . Lack of Transportation (Medical): Not on file  . Lack of Transportation (Non-Medical): Not on file  Physical Activity:   . Days of Exercise per Week: Not on file  . Minutes of Exercise per Session: Not on file  Stress:   . Feeling of Stress : Not on file  Social Connections:   . Frequency of Communication with Friends and Family: Not on file  . Frequency of Social Gatherings with Friends and Family: Not on file  . Attends Religious Services: Not on file  . Active Member of Clubs or Organizations: Not on file  . Attends Archivist Meetings: Not on file  . Marital Status: Not on file  Intimate Partner Violence:   . Fear of Current or Ex-Partner: Not on file  . Emotionally Abused: Not on file  . Physically Abused: Not on file  . Sexually Abused: Not on file    Family History  Problem Relation Age of Onset  . Atrial fibrillation Mother   . COPD Mother   . Hypertension Father   . Renal cancer Father     Review of Systems:  As stated in  the HPI and otherwise negative.   BP 120/80   Pulse 71   Ht 5\' 3"  (1.6 m)   Wt 161 lb 9.6 oz (73.3 kg)   SpO2 98%   BMI 28.63 kg/m   Physical Examination: General: Well developed, well nourished, NAD  HEENT: OP clear, mucus membranes moist  SKIN: warm, dry. No rashes. Neuro: No focal deficits  Musculoskeletal: Muscle strength 5/5 all ext  Psychiatric: Mood and affect normal  Neck: No JVD, no carotid bruits, no thyromegaly, no lymphadenopathy.  Lungs:Clear bilaterally, no wheezes, rhonci, crackles Cardiovascular: Regular rate and rhythm. No murmurs, gallops or rubs. Abdomen:Soft. Bowel sounds present. Non-tender.  Extremities: No lower extremity edema. Pulses are 2 + in the bilateral DP/PT.  EKG:  EKG is ordered today. The ekg ordered today demonstrates Sinus, rate 71 bpm  Recent Labs: No results found for requested labs within last 8760 hours.   Lipid Panel No results found for: CHOL, TRIG, HDL, CHOLHDL, VLDL, LDLCALC, LDLDIRECT   Wt Readings from Last 3 Encounters:  04/18/20 161 lb 9.6 oz (73.3 kg)  11/28/15 163 lb 4.8 oz (74.1 kg)     Other studies Reviewed: Additional studies/ records that were reviewed today include: EKG, office notes Review of the above records demonstrates:    Assessment and Plan:   1. Syncope: I think that her syncopal event was likely a vasovagal event. Her EKG is normal and she has no palpitations at home. She has had no recurrent events over the past 9 weeks. Will arrange an echo to assess LV systolic function and exclude structural heart disease. I will plan to see her back as needed.   Current medicines are reviewed at length with the patient today.  The patient does not have concerns regarding medicines.  The following changes have been made:  no change  Labs/ tests ordered today include:   Orders Placed This Encounter  Procedures  . EKG 12-Lead  . ECHOCARDIOGRAM COMPLETE     Disposition:   FU with me as needed.     Signed, Lauree Chandler, MD 04/18/2020 11:14 AM    [facility] [place], [place], [place]  [address] Phone: [phone]; Fax: [phone]

## 2020-04-18 ENCOUNTER — Ambulatory Visit: Payer: BC Managed Care – PPO | Admitting: Cardiovascular Disease

## 2020-04-18 ENCOUNTER — Other Ambulatory Visit: Payer: Self-pay

## 2020-04-18 ENCOUNTER — Encounter: Payer: Self-pay | Admitting: Cardiovascular Disease

## 2020-04-18 VITALS — BP 120/80 | HR 71 | Ht 63.0 in | Wt 161.6 lb

## 2020-04-18 DIAGNOSIS — R55 Syncope and collapse: Secondary | ICD-10-CM | POA: Diagnosis not present

## 2020-04-18 NOTE — Patient Instructions (Signed)
Medication Instructions:  Your physician recommends that you continue on your current medications as directed. Please refer to the Current Medication list given to you today.  Labwork: None ordered.  Testing/Procedures: Your physician has requested that you have an echocardiogram. Echocardiography is a painless test that uses sound waves to create images of your heart. It provides your doctor with information about the size and shape of your heart and how well your heart's chambers and valves are working. This procedure takes approximately one hour. There are no restrictions for this procedure.   Follow-Up: Your physician recommends that you schedule a follow-up appointment as needed with Dr. Julianne Handler  Any Other Special Instructions Will Be Listed Below (If Applicable).     If you need a refill on your cardiac medications before your next appointment, please call your pharmacy.

## 2020-04-25 ENCOUNTER — Other Ambulatory Visit: Payer: Self-pay | Admitting: Obstetrics and Gynecology

## 2020-04-25 DIAGNOSIS — Z1231 Encounter for screening mammogram for malignant neoplasm of breast: Secondary | ICD-10-CM

## 2020-05-06 ENCOUNTER — Other Ambulatory Visit: Payer: Self-pay

## 2020-05-06 ENCOUNTER — Ambulatory Visit (HOSPITAL_COMMUNITY): Payer: BC Managed Care – PPO | Attending: Cardiology

## 2020-05-06 DIAGNOSIS — R55 Syncope and collapse: Secondary | ICD-10-CM | POA: Diagnosis not present

## 2020-05-06 LAB — ECHOCARDIOGRAM COMPLETE
Area-P 1/2: 2.57 cm2
S' Lateral: 2.6 cm

## 2020-05-13 ENCOUNTER — Other Ambulatory Visit: Payer: BC Managed Care – PPO

## 2020-05-13 DIAGNOSIS — Z20822 Contact with and (suspected) exposure to covid-19: Secondary | ICD-10-CM

## 2020-05-14 LAB — NOVEL CORONAVIRUS, NAA: SARS-CoV-2, NAA: NOT DETECTED

## 2020-05-14 LAB — SARS-COV-2, NAA 2 DAY TAT

## 2020-06-04 ENCOUNTER — Ambulatory Visit (INDEPENDENT_AMBULATORY_CARE_PROVIDER_SITE_OTHER): Payer: BC Managed Care – PPO | Admitting: Otolaryngology

## 2020-06-04 ENCOUNTER — Other Ambulatory Visit: Payer: Self-pay

## 2020-06-04 ENCOUNTER — Encounter (INDEPENDENT_AMBULATORY_CARE_PROVIDER_SITE_OTHER): Payer: Self-pay | Admitting: Otolaryngology

## 2020-06-04 VITALS — Temp 97.9°F

## 2020-06-04 DIAGNOSIS — H6123 Impacted cerumen, bilateral: Secondary | ICD-10-CM

## 2020-06-04 NOTE — Progress Notes (Signed)
HPI: Wanda Castro is a 54 y.o. female who presents for evaluation of wax buildup in her ears.  She was last cleaned in April.  She has had partial blockage but on recent exam by her PCP she had increased blockage on the left side..  Past Medical History:  Diagnosis Date  . GERD (gastroesophageal reflux disease)   . Hypertension   . Migraine   . Right ureteral stone   . Wears contact lenses    Past Surgical History:  Procedure Laterality Date  . COLONOSCOPY  2015 approx   Social History   Socioeconomic History  . Marital status: Single    Spouse name: Not on file  . Number of children: Not on file  . Years of education: Not on file  . Highest education level: Not on file  Occupational History  . 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 Smoking status: Never Smoker  . Smokeless tobacco: Never Used  Substance and Sexual Activity  . Alcohol use: No  . Drug use: No  . Sexual activity: Not on file  Other Topics Concern  . Not on file  Social History Narrative  . Not on file   Social Determinants of Health   Financial Resource Strain:   . Difficulty of Paying Living Expenses: Not on file  Food Insecurity:   . Worried About Charity fundraiser in the Last Year: Not on file  . Ran Out of Food in the Last Year: Not on file  Transportation Needs:   . Lack of Transportation (Medical): Not on file  . Lack of Transportation (Non-Medical): Not on file  Physical Activity:   . Days of Exercise per Week: Not on file  . Minutes of Exercise per Session: Not on file  Stress:   . Feeling of Stress : Not on file  Social Connections:   . Frequency of Communication with Friends and Family: Not on file  . Frequency of Social Gatherings with Friends and Family: Not on file  . Attends Religious Services: Not on file  . Active Member of Clubs or Organizations: Not on file  . Attends Archivist Meetings: Not on file  . Marital Status:  Not on file   Family History  Problem Relation Age of Onset  . Atrial fibrillation Mother   . COPD Mother   . Hypertension Father   . Renal cancer Father    Allergies  Allergen Reactions  . Adhesive [Tape] Other (See Comments)    Red irritated skin   Prior to Admission medications   Medication Sig Start Date End Date Taking? Authorizing Provider  Calcium Carb-Cholecalciferol (CALCIUM 600 + D PO) Take 1 tablet by mouth daily.   Yes [provider]  losartan-hydrochlorothiazide (HYZAAR) 100-12.5 MG tablet Take 0.5 tablets by mouth every morning.  09/24/15  Yes [provider]  Multiple Vitamin (MULTIVITAMIN WITH MINERALS) TABS tablet Take 1 tablet by mouth daily.   Yes [provider]  omeprazole (PRILOSEC) 40 MG capsule Take 40 mg by mouth 2 (two) times daily as needed (for stomach).  09/24/15  Yes [provider]  ondansetron (ZOFRAN) 4 MG tablet Take 1 tablet (4 mg total) by mouth every 6 (six) hours as needed for nausea or vomiting. 6/76/72  Yes Delora Fuel, MD  oxyCODONE-acetaminophen (PERCOCET) 5-325 MG tablet Take 1 tablet by mouth every 4 (four) hours as needed for moderate pain. 0/94/70  Yes Delora Fuel, MD  tamsulosin (  FLOMAX) 0.4 MG CAPS capsule Take 1 capsule (0.4 mg total) by mouth daily. Patient taking differently: Take 0.4 mg by mouth daily after breakfast.  2/64/15  Yes Delora Fuel, MD     Positive ROS: Otherwise negative  All other systems have been reviewed and were otherwise negative with the exception of those mentioned in the HPI and as above.  Physical Exam: Constitutional: Alert, well-appearing, no acute distress Ears: External ears without lesions or tenderness. Ear canals are both completely occluded with cerumen that was removed with suction and curettes.  TMs were otherwise clear.. Nasal: External nose without lesions. Clear nasal passages Oral: Oropharynx clear. Neck: No palpable adenopathy or masses Respiratory:  Breathing comfortably  Skin: No facial/neck lesions or rash noted.  Cerumen impaction removal  Date/Time: 06/04/2020 4:45 PM Performed by: Rozetta Nunnery, MD Authorized by: Rozetta Nunnery, MD   Consent:    Consent obtained:  Verbal   Consent given by:  Patient   Risks discussed:  Pain and bleeding Procedure details:    Location:  L ear and R ear   Procedure type: curette and suction   Post-procedure details:    Inspection:  TM intact and canal normal   Hearing quality:  Improved   Patient tolerance of procedure:  Tolerated well, no immediate complications Comments:     TMs are otherwise clear    Assessment: Bilateral cerumen impactions  Plan: This was cleaned in the office with hearing much better after cleaning the ear canals. Discussed with her concerning using Debrox for earwax drops on a monthly basis.  She will follow-up as needed.  Radene Journey, MD

## 2020-06-17 ENCOUNTER — Ambulatory Visit
Admission: RE | Admit: 2020-06-17 | Discharge: 2020-06-17 | Disposition: A | Discharge status: Home / Self Care | Payer: BC Managed Care – PPO | Source: Ambulatory Visit | Attending: Obstetrics and Gynecology | Admitting: Obstetrics and Gynecology

## 2020-06-17 ENCOUNTER — Other Ambulatory Visit: Payer: Self-pay

## 2020-06-17 DIAGNOSIS — Z1231 Encounter for screening mammogram for malignant neoplasm of breast: Secondary | ICD-10-CM

## 2020-11-07 ENCOUNTER — Other Ambulatory Visit: Payer: Self-pay

## 2020-11-07 ENCOUNTER — Ambulatory Visit: Payer: Self-pay | Attending: Internal Medicine

## 2020-11-07 DIAGNOSIS — Z20822 Contact with and (suspected) exposure to covid-19: Secondary | ICD-10-CM

## 2020-11-08 LAB — NOVEL CORONAVIRUS, NAA: SARS-CoV-2, NAA: NOT DETECTED

## 2020-11-08 LAB — SARS-COV-2, NAA 2 DAY TAT

## 2021-04-03 ENCOUNTER — Ambulatory Visit (INDEPENDENT_AMBULATORY_CARE_PROVIDER_SITE_OTHER): Payer: Self-pay | Admitting: Otolaryngology

## 2021-04-14 ENCOUNTER — Other Ambulatory Visit: Payer: Self-pay | Admitting: Obstetrics and Gynecology

## 2021-04-14 DIAGNOSIS — Z1231 Encounter for screening mammogram for malignant neoplasm of breast: Secondary | ICD-10-CM

## 2021-06-19 ENCOUNTER — Other Ambulatory Visit: Payer: Self-pay

## 2021-06-19 ENCOUNTER — Ambulatory Visit
Admission: RE | Admit: 2021-06-19 | Discharge: 2021-06-19 | Disposition: A | Discharge status: Home / Self Care | Payer: BC Managed Care – PPO | Source: Ambulatory Visit | Attending: Obstetrics and Gynecology | Admitting: Obstetrics and Gynecology

## 2021-06-19 DIAGNOSIS — Z1231 Encounter for screening mammogram for malignant neoplasm of breast: Secondary | ICD-10-CM

## 2021-06-23 ENCOUNTER — Other Ambulatory Visit: Payer: Self-pay | Admitting: Obstetrics and Gynecology

## 2021-06-23 DIAGNOSIS — R928 Other abnormal and inconclusive findings on diagnostic imaging of breast: Secondary | ICD-10-CM

## 2021-07-31 ENCOUNTER — Other Ambulatory Visit: Payer: Self-pay | Admitting: Obstetrics and Gynecology

## 2021-07-31 ENCOUNTER — Ambulatory Visit
Admission: RE | Admit: 2021-07-31 | Discharge: 2021-07-31 | Disposition: A | Discharge status: Home / Self Care | Payer: BC Managed Care – PPO | Source: Ambulatory Visit | Attending: Obstetrics and Gynecology | Admitting: Obstetrics and Gynecology

## 2021-07-31 DIAGNOSIS — R928 Other abnormal and inconclusive findings on diagnostic imaging of breast: Secondary | ICD-10-CM

## 2021-08-04 ENCOUNTER — Ambulatory Visit
Admission: RE | Admit: 2021-08-04 | Discharge: 2021-08-04 | Disposition: A | Discharge status: Home / Self Care | Payer: BC Managed Care – PPO | Source: Ambulatory Visit | Attending: Obstetrics and Gynecology | Admitting: Obstetrics and Gynecology

## 2021-08-04 DIAGNOSIS — R928 Other abnormal and inconclusive findings on diagnostic imaging of breast: Secondary | ICD-10-CM

## 2021-08-04 HISTORY — PX: BREAST BIOPSY: SHX20

## 2021-08-11 ENCOUNTER — Telehealth: Payer: Self-pay | Admitting: Hematology

## 2021-08-11 ENCOUNTER — Other Ambulatory Visit: Payer: Self-pay

## 2021-08-11 ENCOUNTER — Encounter: Payer: Self-pay | Admitting: Hematology

## 2021-08-11 ENCOUNTER — Inpatient Hospital Stay: Payer: BC Managed Care – PPO | Attending: Hematology | Admitting: Hematology

## 2021-08-11 ENCOUNTER — Other Ambulatory Visit: Payer: Self-pay | Admitting: *Deleted

## 2021-08-11 VITALS — BP 128/69 | HR 88 | Temp 99.4°F | Resp 18 | Ht 63.0 in | Wt 165.3 lb

## 2021-08-11 DIAGNOSIS — Z800 Family history of malignant neoplasm of digestive organs: Secondary | ICD-10-CM | POA: Diagnosis not present

## 2021-08-11 DIAGNOSIS — C50412 Malignant neoplasm of upper-outer quadrant of left female breast: Secondary | ICD-10-CM | POA: Insufficient documentation

## 2021-08-11 DIAGNOSIS — Z8041 Family history of malignant neoplasm of ovary: Secondary | ICD-10-CM | POA: Diagnosis not present

## 2021-08-11 DIAGNOSIS — Z170 Estrogen receptor positive status [ER+]: Secondary | ICD-10-CM | POA: Insufficient documentation

## 2021-08-11 NOTE — Telephone Encounter (Signed)
Scheduled appt per 1/30 referral. Pt is aware to arrive 30 mins prior to appt time. Pt is aware of appt date and time.

## 2021-08-11 NOTE — Progress Notes (Signed)
[place]   [phone] Fax:([phone] [place] Note   Patient Care Team: [name], MD as PCP - General (Internal Medicine) Mauro Kaufmann, RN as Oncology Nurse Navigator Rockwell Germany, RN as Oncology Nurse Navigator 08/11/2021  REFERRAL PHYSICIAN: Dr. Marlou Starks   CHIEF COMPLAINTS/PURPOSE OF CONSULTATION:  Newly diagnosed left breast cancer  ASSESSMENT & PLAN:  56 yo female   Malignant neoplasm of upper-outer quadrant of left breast, invasive ductal carcinoma, [id number], ER+/PR+/HER2- ---We discussed her imaging findings and the biopsy results in great details. I called path lab today and was verbally told her that her HER2 FISH was negative -Giving the early stage disease, she is a candidate for lumpectomy.  She has been seen by breast surgeon Dr. Marlou Starks. She is undecided about lumpectomy versus mastectomy.  We discussed the pros and cons of each surgery.  -if her surgical path shows tumor >1cm or 5-54m with Grade 2 or 3 disease, I would recommend Oncotype for risk stratification, and decision on adjuvant chemotherapy.  If tumor is 1 cm or less in degree 1, she will not need Oncotype or adjuvant chemotherapy.   -Giving the strong ER and PR expression in her postmenopausal status, I recommend adjuvant endocrine therapy with aromatase inhibitor for a total of 5-10 years to reduce the risk of cancer recurrence. Potential benefits and side effects were discussed with patient and she is interested. -She is also scheduled to meet radiation oncologist Dr. MLisbeth Renshawtomorrow.   -We also discussed the breast cancer surveillance after her surgery. She will continue annual screening mammogram, self exam, and a routine office visit with lab and exam with [place] -I encouraged her to have healthy diet and exercise regularly.  -Patient and her friend had many questions, I answered to the best of my ability to their satisfaction.  2. Genetics -Due to her  family history of ovarian and colon cancer, I will recommend genetic testing.  She is also considering bilateral double mastectomy, genetic test result may help her to make the final surgical decision.  Plan -urgent genetic referral -she is undecided about lumpectomy versus mastectomy -Oncotype if her tumor>1cm on surgical sample or 5-177mwith G2/3 disease  -I will see her after radiation or surgery. -If you choose to have double mastectomy and reconstruction, I may start her on anastrozole when she is awaiting for surgery.    HISTORY OF PRESENTING ILLNESS:  Wanda Castro [age].o. female is here because of her recently diagnosed left breast cancer.  She was referred by her breast surgeon Dr. ToMarlou Starks She presents to the clinic with her friend.  This was discovered on her recent screening mammogram in December 2022, which showed a focal asymmetry in the left breast.  Diagnostic mammogram and ultrasound showed a 3 mm hypoechoic oval mass in the 2 o'clock position of the left breast, 3 cm from nipple.  Axillary lymph nodes were negative by ultrasound.  She underwent a biopsy on August 04, 2021 that showed a grade 1 invasive ductal carcinoma, ER/PR positive and HER2 negative.  Ki-67 5%.  She has met breast surgeon Dr. ToMarlou Starksand was referred to [place] discuss adjuvant therapy.  She feels well overall, denies any skin change or nipple discharge.  She has normal appetite and energy level, no recent weight change.  She is single, never pregnant, she had a menopause several years ago, she has been using vaginal estrogen for vaginal atrophy.  MEDICAL HISTORY:  Past Medical History:  Diagnosis Date   GERD (gastroesophageal reflux disease)    Hypertension    Migraine    Right ureteral stone    Wears contact lenses     SURGICAL HISTORY: Past Surgical History:  Procedure Laterality Date   BREAST BIOPSY Left 08/04/2021   COLONOSCOPY  2015 approx    SOCIAL HISTORY: Social History    Socioeconomic History   Marital status: Single    Spouse name: Not on file   Number of children: Not on file   Years of education: Not on file   Highest education level: Not on file  Occupational History   Occupation: She works in administration in [place] Use   Smoking status: Never   Smokeless tobacco: Never  Substance and Sexual Activity   Alcohol use: No   Drug use: No   Sexual activity: Not on file  Other Topics Concern   Not on file  Social History Narrative   Not on file   Social Determinants of Health   Financial Resource Strain: Not on file  Food Insecurity: Not on file  Transportation Needs: Not on file  Physical Activity: Not on file  Stress: Not on file  Social Connections: Not on file  Intimate Partner Violence: Not on file    FAMILY HISTORY: Family History  Problem Relation Age of Onset   Atrial fibrillation Mother    COPD Mother    Hypertension Father    Renal cancer Father     ALLERGIES:  is allergic to adhesive [tape].  MEDICATIONS:  Current Outpatient Medications  Medication Sig Dispense Refill   Calcium Carb-Cholecalciferol (CALCIUM 600 + D PO) Take 1 tablet by mouth daily.     losartan-hydrochlorothiazide (HYZAAR) 100-12.5 MG tablet Take 0.5 tablets by mouth every morning.      Multiple Vitamin (MULTIVITAMIN WITH MINERALS) TABS tablet Take 1 tablet by mouth daily.     omeprazole (PRILOSEC) 40 MG capsule Take 40 mg by mouth 2 (two) times daily as needed (for stomach).      ondansetron (ZOFRAN) 4 MG tablet Take 1 tablet (4 mg total) by mouth every 6 (six) hours as needed for nausea or vomiting. 12 tablet 0   oxyCODONE-acetaminophen (PERCOCET) 5-325 MG tablet Take 1 tablet by mouth every 4 (four) hours as needed for moderate pain. 20 tablet 0   tamsulosin (FLOMAX) 0.4 MG CAPS capsule Take 1 capsule (0.4 mg total) by mouth daily. (Patient taking differently: Take 0.4 mg by mouth daily after breakfast. ) 10 capsule 0    No current facility-administered medications for this visit.    REVIEW OF SYSTEMS:   Constitutional: Denies fevers, chills or abnormal night sweats Eyes: Denies blurriness of vision, double vision or watery eyes Ears, nose, mouth, throat, and face: Denies mucositis or sore throat Respiratory: Denies cough, dyspnea or wheezes Cardiovascular: Denies palpitation, chest discomfort or lower extremity swelling Gastrointestinal:  Denies nausea, heartburn or change in bowel habits Skin: Denies abnormal skin rashes Lymphatics: Denies new lymphadenopathy or easy bruising Neurological:Denies numbness, tingling or new weaknesses Behavioral/Psych: Mood is stable, no new changes  All other systems were reviewed with the patient and are negative.  PHYSICAL EXAMINATION: ECOG PERFORMANCE STATUS: 0 - Asymptomatic  Vitals:   08/11/21 1505  BP: 128/69  Pulse: 88  Resp: 18  Temp: 99.4 F (37.4 C)  SpO2: 99%   Filed Weights   08/11/21 1505  Weight: 165 lb 4.8 oz (75 kg)    GENERAL:alert, no  distress and comfortable SKIN: skin color, texture, turgor are normal, no rashes or significant lesions EYES: normal, conjunctiva are pink and non-injected, sclera clear OROPHARYNX:no exudate, no erythema and lips, buccal mucosa, and tongue normal  NECK: supple, thyroid normal size, non-tender, without nodularity LYMPH:  no palpable lymphadenopathy in the cervical, axillary or inguinal LUNGS: clear to auscultation and percussion with normal breathing effort HEART: regular rate & rhythm and no murmurs and no lower extremity edema ABDOMEN:abdomen soft, non-tender and normal bowel sounds Musculoskeletal:no cyanosis of digits and no clubbing  PSYCH: alert & oriented x 3 with fluent speech NEURO: no focal motor/sensory deficits Breasts: Breast inspection showed them to be symmetrical with no nipple discharge. Palpation of the breasts and axilla revealed no obvious mass that I could  appreciate.   LABORATORY DATA:  I have reviewed the data as listed CBC Latest Ref Rng & Units 11/28/2015  WBC 4.0 - 10.5 K/uL 11.2(H)  Hemoglobin 12.0 - 15.0 g/dL 12.4  Hematocrit 36.0 - 46.0 % 37.2  Platelets 150 - 400 K/uL 255   CMP Latest Ref Rng & Units 11/28/2015  Glucose 65 - 99 mg/dL 156(H)  BUN 6 - 20 mg/dL 17  Creatinine 0.44 - 1.00 mg/dL 1.00  Sodium 135 - 145 mmol/L 139  Potassium 3.5 - 5.1 mmol/L 3.3(L)  Chloride 101 - 111 mmol/L 101  CO2 22 - 32 mmol/L 25  Calcium 8.9 - 10.3 mg/dL 9.1  Total Protein 6.5 - 8.1 g/dL 6.7  Total Bilirubin 0.3 - 1.2 mg/dL 0.7  Alkaline Phos 38 - 126 U/L 70  AST 15 - 41 U/L 22  ALT 14 - 54 U/L 17     RADIOGRAPHIC STUDIES: I have personally reviewed the radiological images as listed and agreed with the findings in the report. US BREAST LTD UNI LEFT INC AXILLA  Result Date: 07/31/2021 CLINICAL DATA:  Screening recall for a left breast asymmetry. EXAM: DIGITAL DIAGNOSTIC UNILATERAL LEFT MAMMOGRAM WITH TOMOSYNTHESIS AND CAD; ULTRASOUND LEFT BREAST LIMITED TECHNIQUE: Left digital diagnostic mammography and breast tomosynthesis was performed. The images were evaluated with computer-aided detection.; Targeted ultrasound examination of the left breast was performed. COMPARISON:  Previous exam(s). ACR Breast Density Category b: There are scattered areas of fibroglandular density. FINDINGS: Spot compression tomosynthesis images through the upper outer quadrant of the left breast demonstrates a 3 mm oval mass with indistinct and spiculated margins in the CC view. Ultrasound targeted to the left breast at 2 o'clock, 3 cm from the nipple demonstrates a hypoechoic oval mass with indistinct margins measuring 3 x 2 x 3 mm. Ultrasound of the left axilla demonstrates multiple normal-appearing lymph nodes. IMPRESSION: 1. There is an indeterminate 3 mm mass in the left breast at 2 o'clock. 2.  No evidence of left axillary lymphadenopathy. RECOMMENDATION: Ultrasound  guided biopsy is recommended for the left breast mass. The procedure has been scheduled for 08/04/2021 at 8:30 a.m. I have discussed the findings and recommendations with the patient. If applicable, a reminder letter will be sent to the patient regarding the next appointment. BI-RADS CATEGORY  4: Suspicious. Electronically Signed   By: Ammie Ferrier M.D.   On: 07/31/2021 09:02  MM DIAG BREAST TOMO UNI LEFT  Result Date: 07/31/2021 CLINICAL DATA:  Screening recall for a left breast asymmetry. EXAM: DIGITAL DIAGNOSTIC UNILATERAL LEFT MAMMOGRAM WITH TOMOSYNTHESIS AND CAD; ULTRASOUND LEFT BREAST LIMITED TECHNIQUE: Left digital diagnostic mammography and breast tomosynthesis was performed. The images were evaluated with computer-aided detection.; Targeted ultrasound examination of the left  breast was performed. COMPARISON:  Previous exam(s). ACR Breast Density Category b: There are scattered areas of fibroglandular density. FINDINGS: Spot compression tomosynthesis images through the upper outer quadrant of the left breast demonstrates a 3 mm oval mass with indistinct and spiculated margins in the CC view. Ultrasound targeted to the left breast at 2 o'clock, 3 cm from the nipple demonstrates a hypoechoic oval mass with indistinct margins measuring 3 x 2 x 3 mm. Ultrasound of the left axilla demonstrates multiple normal-appearing lymph nodes. IMPRESSION: 1. There is an indeterminate 3 mm mass in the left breast at 2 o'clock. 2.  No evidence of left axillary lymphadenopathy. RECOMMENDATION: Ultrasound guided biopsy is recommended for the left breast mass. The procedure has been scheduled for 08/04/2021 at 8:30 a.m. I have discussed the findings and recommendations with the patient. If applicable, a reminder letter will be sent to the patient regarding the next appointment. BI-RADS CATEGORY  4: Suspicious. Electronically Signed   By: Ammie Ferrier M.D.   On: 07/31/2021 09:02  MM CLIP PLACEMENT LEFT  Result  Date: 08/04/2021 CLINICAL DATA:  Status post ultrasound-guided core biopsy of mass in the LEFT breast. EXAM: 3D DIAGNOSTIC LEFT MAMMOGRAM POST ULTRASOUND BIOPSY COMPARISON:  Previous exam(s). FINDINGS: 3D Mammographic images were obtained following ultrasound guided biopsy of mass in the 2 o'clock location of the LEFT breast and placement of a ribbon shaped clip. The biopsy marking clip is in expected position at the site of biopsy. IMPRESSION: Appropriate positioning of the ribbon shaped biopsy marking clip at the site of biopsy in the UPPER-OUTER QUADRANT LEFT breast. Final Assessment: Post Procedure Mammograms for Marker Placement Electronically Signed   By: Nolon Nations M.D.   On: 08/04/2021 09:31  [name] LT BREAST BX W LOC DEV 1ST LESION IMG BX SPEC US GUIDE  Addendum Date: 08/05/2021   ADDENDUM REPORT: 08/05/2021 14:58 ADDENDUM: Pathology revealed GRADE I INVASIVE DUCTAL CARCINOMA, DUCTAL CARCINOMA IN SITU of the LEFT breast, upper outer quadrant, 2 o'clock, 3cmfn, (ribbon clip). This was found to be concordant by Dr. Nolon Nations. Pathology results were discussed with the patient by telephone. The patient reported doing well after the biopsy with tenderness at the site. Post biopsy instructions and care were reviewed and questions were answered. The patient was encouraged to call [facility] for any additional concerns. My direct phone number was provided. Surgical consultation has been arranged with Dr. Autumn Messing at [facility] on August 07, 2021. Pathology results reported by Terie Purser, RN on 08/05/2021. Electronically Signed   By: Nolon Nations M.D.   On: 08/05/2021 14:58   Result Date: 08/05/2021 CLINICAL DATA:  Patient presents for ultrasound-guided core biopsy of mass in the LEFT breast. EXAM: ULTRASOUND GUIDED LEFT BREAST CORE NEEDLE BIOPSY COMPARISON:  Previous exam(s). PROCEDURE: I met with the patient and we discussed the procedure of  ultrasound-guided biopsy, including benefits and alternatives. We discussed the high likelihood of a successful procedure. We discussed the risks of the procedure, including infection, bleeding, tissue injury, clip migration, and inadequate sampling. Informed written consent was given. The usual time-out protocol was performed immediately prior to the procedure. Lesion quadrant: Upper outer quadrant LEFT breast Using sterile technique and 1% Lidocaine as local anesthetic, under direct ultrasound visualization, a 12 gauge spring-loaded device was used to perform biopsy of mass in the 2 o'clock location of the LEFT breast 3 centimeters from the nipple using a LATERAL to approach. At the conclusion of the procedure  ribbon shaped tissue marker clip was deployed into the biopsy cavity. Follow up 2 view mammogram was performed and dictated separately. IMPRESSION: Ultrasound guided biopsy of LEFT breast mass. No apparent complications. Electronically Signed: By: Nolon Nations M.D. On: 08/04/2021 09:17     All questions were answered. The patient knows to call the clinic with any problems, questions or concerns. I spent 40 minutes counseling the patient face to face. The total time spent in the appointment was 50 minutes and more than 50% was on counseling.     Truitt Merle, MD 08/11/2021 3:14 PM

## 2021-08-11 NOTE — Progress Notes (Signed)
Radiation Oncology         [phone] ________________________________  Name: Wanda Castro        MRN: [id number]  Date of Service: 08/12/2021 DOB: 1966-05-18  [id number], Ermalene Searing, MD  Jovita Kussmaul, MD     REFERRING PHYSICIAN: Autumn Messing III, MD   DIAGNOSIS: The encounter diagnosis was Malignant neoplasm of upper-outer quadrant of left breast in female, estrogen receptor positive ([place]).   HISTORY OF PRESENT ILLNESS: Wanda Castro is a 56 y.o. female seen at the request of Dr. Marlou Starks for a new diagnosis of left breast cancer. The patient was recently found on screening mammogram in December 2022 to have a focal asymmetry in the left breast.  Further diagnostic work-up showed a hypoechoic oval mass with indistinct margins measuring up to 3 mm in the 2 o'clock position of the left breast.  Her axilla showed multiple normal-appearing lymph nodes.  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 Her Ki 67 was 5%. She's seen today to discuss treatment recommendations of her cancer.    PREVIOUS RADIATION THERAPY: No   PAST MEDICAL HISTORY:  Past Medical History:  Diagnosis Date   GERD (gastroesophageal reflux disease)    HTN (hypertension)    Hypertension    Migraine    Right ureteral stone    Wears contact lenses        PAST SURGICAL HISTORY: Past Surgical History:  Procedure Laterality Date   BREAST BIOPSY Left 08/04/2021   COLONOSCOPY  2015 approx     FAMILY HISTORY:  Family History  Problem Relation Age of Onset   Atrial fibrillation Mother    COPD Mother    Hypertension Father    Renal cancer Father    Cancer Maternal Uncle 78       colon cancer   Cancer Paternal Aunt        small cell lung cancer   Cancer Cousin 62       ovarian cancer   Cancer Cousin        small cell lung cancer   Cancer Cousin        small cell lung cancer   Cancer Cousin        uterine and ovarian cancer    Cancer Cousin        colon cancer     SOCIAL HISTORY:  reports that she has never smoked. She has never used smokeless tobacco. She reports that she does not drink alcohol and does not use drugs. She is single and lives in [place]. She works in an office as an Web designer. She's accompanied by her cousin Ivin Booty.   ALLERGIES: Adhesive [tape]   MEDICATIONS:  Current Outpatient Medications  Medication Sig Dispense Refill   Calcium Carb-Cholecalciferol (CALCIUM 600 + D PO) Take 1 tablet by mouth daily.     losartan-hydrochlorothiazide (HYZAAR) 100-12.5 MG tablet Take 0.5 tablets by mouth every morning.      Multiple Vitamin (MULTIVITAMIN WITH MINERALS) TABS tablet Take 1 tablet by mouth daily.     omeprazole (PRILOSEC) 40 MG capsule Take 40 mg by mouth 2 (two) times daily as needed (for stomach).      ondansetron (ZOFRAN) 4 MG tablet Take 1 tablet (4 mg total) by mouth every 6 (six) hours as needed for nausea or vomiting. 12 tablet 0   oxyCODONE-acetaminophen (PERCOCET) 5-325 MG tablet Take 1 tablet by mouth every 4 (four)  hours as needed for moderate pain. 20 tablet 0   tamsulosin (FLOMAX) 0.4 MG CAPS capsule Take 1 capsule (0.4 mg total) by mouth daily. (Patient taking differently: Take 0.4 mg by mouth daily after breakfast. ) 10 capsule 0   No current facility-administered medications for this visit.     REVIEW OF SYSTEMS: On review of systems, the patient reports that she is doing okay overall. She is really interested in having genetic testing performed which she feels would help her make a decision on how she'd like to proceed. No breast specific complaints are otherwise noted.      PHYSICAL EXAM:  Wt Readings from Last 3 Encounters:  08/11/21 165 lb 4.8 oz (75 kg)  04/18/20 161 lb 9.6 oz (73.3 kg)  11/28/15 163 lb 4.8 oz (74.1 kg)   Temp Readings from Last 3 Encounters:  08/11/21 99.4 F (37.4 C) (Oral)  06/04/20 97.9 F (36.6 C) (Tympanic)  10/17/19  98.1 F (36.7 C) (Tympanic)   BP Readings from Last 3 Encounters:  08/11/21 128/69  04/18/20 120/80  11/29/15 103/69   Pulse Readings from Last 3 Encounters:  08/11/21 88  04/18/20 71  11/29/15 99    In general this is a well appearing caucasian female in no acute distress. She's alert and oriented x4 and appropriate throughout the examination. Cardiopulmonary assessment is negative for acute distress and she exhibits normal effort. Bilateral breast exam is deferred.    ECOG = 0  0 - Asymptomatic (Fully active, able to carry on all predisease activities without restriction)  1 - Symptomatic but completely ambulatory (Restricted in physically strenuous activity but ambulatory and able to carry out work of a light or sedentary nature. For example, light housework, office work)  2 - Symptomatic, <50% in bed during the day (Ambulatory and capable of all self care but unable to carry out any work activities. Up and about more than 50% of waking hours)  3 - Symptomatic, >50% in bed, but not bedbound (Capable of only limited self-care, confined to bed or chair 50% or more of waking hours)  4 - Bedbound (Completely disabled. Cannot carry on any self-care. Totally confined to bed or chair)  5 - Death   Eustace Pen MM, Creech RH, Tormey DC, et al. [phone]). "Toxicity and response criteria of the [facility]". [place] Oncol. 5 (6): 649-55    LABORATORY DATA:  Lab Results  Component Value Date   WBC 11.2 (H) 11/28/2015   HGB 12.4 11/28/2015   HCT 37.2 11/28/2015   MCV 89.6 11/28/2015   PLT 255 11/28/2015   Lab Results  Component Value Date   NA 139 11/28/2015   K 3.3 (L) 11/28/2015   CL 101 11/28/2015   CO2 25 11/28/2015   Lab Results  Component Value Date   ALT 17 11/28/2015   AST 22 11/28/2015   ALKPHOS 70 11/28/2015   BILITOT 0.7 11/28/2015      RADIOGRAPHY: US BREAST LTD UNI LEFT INC AXILLA  Result Date: 07/31/2021 CLINICAL DATA:  Screening  recall for a left breast asymmetry. EXAM: DIGITAL DIAGNOSTIC UNILATERAL LEFT MAMMOGRAM WITH TOMOSYNTHESIS AND CAD; ULTRASOUND LEFT BREAST LIMITED TECHNIQUE: Left digital diagnostic mammography and breast tomosynthesis was performed. The images were evaluated with computer-aided detection.; Targeted ultrasound examination of the left breast was performed. COMPARISON:  Previous exam(s). ACR Breast Density Category b: There are scattered areas of fibroglandular density. FINDINGS: Spot compression tomosynthesis images through the upper outer quadrant of the left  breast demonstrates a 3 mm oval mass with indistinct and spiculated margins in the CC view. Ultrasound targeted to the left breast at 2 o'clock, 3 cm from the nipple demonstrates a hypoechoic oval mass with indistinct margins measuring 3 x 2 x 3 mm. Ultrasound of the left axilla demonstrates multiple normal-appearing lymph nodes. IMPRESSION: 1. There is an indeterminate 3 mm mass in the left breast at 2 o'clock. 2.  No evidence of left axillary lymphadenopathy. RECOMMENDATION: Ultrasound guided biopsy is recommended for the left breast mass. The procedure has been scheduled for 08/04/2021 at 8:30 a.m. I have discussed the findings and recommendations with the patient. If applicable, a reminder letter will be sent to the patient regarding the next appointment. BI-RADS CATEGORY  4: Suspicious. Electronically Signed   By: Ammie Ferrier M.D.   On: 07/31/2021 09:02  MM DIAG BREAST TOMO UNI LEFT  Result Date: 07/31/2021 CLINICAL DATA:  Screening recall for a left breast asymmetry. EXAM: DIGITAL DIAGNOSTIC UNILATERAL LEFT MAMMOGRAM WITH TOMOSYNTHESIS AND CAD; ULTRASOUND LEFT BREAST LIMITED TECHNIQUE: Left digital diagnostic mammography and breast tomosynthesis was performed. The images were evaluated with computer-aided detection.; Targeted ultrasound examination of the left breast was performed. COMPARISON:  Previous exam(s). ACR Breast Density Category b:  There are scattered areas of fibroglandular density. FINDINGS: Spot compression tomosynthesis images through the upper outer quadrant of the left breast demonstrates a 3 mm oval mass with indistinct and spiculated margins in the CC view. Ultrasound targeted to the left breast at 2 o'clock, 3 cm from the nipple demonstrates a hypoechoic oval mass with indistinct margins measuring 3 x 2 x 3 mm. Ultrasound of the left axilla demonstrates multiple normal-appearing lymph nodes. IMPRESSION: 1. There is an indeterminate 3 mm mass in the left breast at 2 o'clock. 2.  No evidence of left axillary lymphadenopathy. RECOMMENDATION: Ultrasound guided biopsy is recommended for the left breast mass. The procedure has been scheduled for 08/04/2021 at 8:30 a.m. I have discussed the findings and recommendations with the patient. If applicable, a reminder letter will be sent to the patient regarding the next appointment. BI-RADS CATEGORY  4: Suspicious. Electronically Signed   By: Ammie Ferrier M.D.   On: 07/31/2021 09:02  MM CLIP PLACEMENT LEFT  Result Date: 08/04/2021 CLINICAL DATA:  Status post ultrasound-guided core biopsy of mass in the LEFT breast. EXAM: 3D DIAGNOSTIC LEFT MAMMOGRAM POST ULTRASOUND BIOPSY COMPARISON:  Previous exam(s). FINDINGS: 3D Mammographic images were obtained following ultrasound guided biopsy of mass in the 2 o'clock location of the LEFT breast and placement of a ribbon shaped clip. The biopsy marking clip is in expected position at the site of biopsy. IMPRESSION: Appropriate positioning of the ribbon shaped biopsy marking clip at the site of biopsy in the UPPER-OUTER QUADRANT LEFT breast. Final Assessment: Post Procedure Mammograms for Marker Placement Electronically Signed   By: Nolon Nations M.D.   On: 08/04/2021 09:31  [name] LT BREAST BX W LOC DEV 1ST LESION IMG BX SPEC US GUIDE  Addendum Date: 08/05/2021   ADDENDUM REPORT: 08/05/2021 14:58 ADDENDUM: Pathology revealed GRADE I INVASIVE  DUCTAL CARCINOMA, DUCTAL CARCINOMA IN SITU of the LEFT breast, upper outer quadrant, 2 o'clock, 3cmfn, (ribbon clip). This was found to be concordant by Dr. Nolon Nations. Pathology results were discussed with the patient by telephone. The patient reported doing well after the biopsy with tenderness at the site. Post biopsy instructions and care were reviewed and questions were answered. The patient was encouraged to call [facility] [place] [facility] for any additional concerns. My direct phone number was provided. Surgical consultation has been arranged with Dr. Autumn Messing at [facility] on August 07, 2021. Pathology results reported by Terie Purser, RN on 08/05/2021. Electronically Signed   By: Nolon Nations M.D.   On: 08/05/2021 14:58   Result Date: 08/05/2021 CLINICAL DATA:  Patient presents for ultrasound-guided core biopsy of mass in the LEFT breast. EXAM: ULTRASOUND GUIDED LEFT BREAST CORE NEEDLE BIOPSY COMPARISON:  Previous exam(s). PROCEDURE: I met with the patient and we discussed the procedure of ultrasound-guided biopsy, including benefits and alternatives. We discussed the high likelihood of a successful procedure. We discussed the risks of the procedure, including infection, bleeding, tissue injury, clip migration, and inadequate sampling. Informed written consent was given. The usual time-out protocol was performed immediately prior to the procedure. Lesion quadrant: Upper outer quadrant LEFT breast Using sterile technique and 1% Lidocaine as local anesthetic, under direct ultrasound visualization, a 12 gauge spring-loaded device was used to perform biopsy of mass in the 2 o'clock location of the LEFT breast 3 centimeters from the nipple using a LATERAL to approach. At the conclusion of the procedure ribbon shaped tissue marker clip was deployed into the biopsy cavity. Follow up 2 view mammogram was performed and dictated separately. IMPRESSION: Ultrasound guided biopsy  of LEFT breast mass. No apparent complications. Electronically Signed: By: Nolon Nations M.D. On: 08/04/2021 09:17      IMPRESSION/PLAN: 1. Stage IA, [id number], grade 1, ER/PR positive invasive ductal carcinoma of the left breast. Dr. Lisbeth Renshaw discusses the pathology findings and reviews the nature of early stage breast disease. We reviewed the rationale for breast conservation with lumpectomy with sentinel node biopsy. She is also contemplating bilateral mastectomies. Depending on the size of the final tumor measurements rendered by pathology, the tumor may be tested for Oncotype Dx score to determine a role for systemic therapy. Provided that chemotherapy is not indicated, and that she has breast conserving surgery, Dr. Lisbeth Renshaw would recommend external radiotherapy to the breast  to reduce risks of local recurrence followed by antiestrogen therapy. We discussed the risks, benefits, short, and long term effects of radiotherapy, as well as the curative intent, and the patient is interested in proceeding. Dr. Lisbeth Renshaw discusses the delivery and logistics of radiotherapy and anticipates a course of 4 or up to 6 1/2 weeks of radiotherapy to the left breast. We will see her back a few weeks after breast conserving surgery if she proceeds in this manner to discuss the simulation process and anticipate we starting radiotherapy about 4-6 weeks after surgery.  2. Possible genetic predisposition to malignancy. The patient is a candidate for genetic testing given her personal and family history. She was offered referral and Dr. Burr Medico is coordinating this to occur today.  In a visit lasting 60 minutes, greater than 50% of the time was spent face to face reviewing her case, as well as in preparation of, discussing, and coordinating the patient's care.  The above documentation reflects my direct findings during this shared patient visit. Please see the separate note by Dr. Lisbeth Renshaw on this date for the remainder of the patient's  plan of care.    Carola Rhine, [facility]    **Disclaimer: This note was dictated with voice recognition software. Similar sounding words can inadvertently be transcribed and this note may contain transcription errors which may not have been corrected upon publication of note.**

## 2021-08-12 ENCOUNTER — Ambulatory Visit
Admission: RE | Admit: 2021-08-12 | Discharge: 2021-08-12 | Disposition: A | Discharge status: Home / Self Care | Payer: BC Managed Care – PPO | Source: Ambulatory Visit | Attending: Radiation Oncology | Admitting: Radiation Oncology

## 2021-08-12 ENCOUNTER — Other Ambulatory Visit: Payer: Self-pay

## 2021-08-12 ENCOUNTER — Encounter: Payer: Self-pay | Admitting: Hematology

## 2021-08-12 ENCOUNTER — Other Ambulatory Visit: Payer: Self-pay | Admitting: Genetic Counselor

## 2021-08-12 ENCOUNTER — Inpatient Hospital Stay: Payer: BC Managed Care – PPO

## 2021-08-12 ENCOUNTER — Inpatient Hospital Stay (HOSPITAL_BASED_OUTPATIENT_CLINIC_OR_DEPARTMENT_OTHER): Payer: BC Managed Care – PPO | Admitting: Genetic Counselor

## 2021-08-12 ENCOUNTER — Encounter: Payer: Self-pay | Admitting: Radiation Oncology

## 2021-08-12 VITALS — BP 119/80 | HR 81 | Temp 97.6°F | Resp 20 | Ht 63.0 in | Wt 164.4 lb

## 2021-08-12 DIAGNOSIS — C50412 Malignant neoplasm of upper-outer quadrant of left female breast: Secondary | ICD-10-CM

## 2021-08-12 DIAGNOSIS — Z1379 Encounter for other screening for genetic and chromosomal anomalies: Secondary | ICD-10-CM

## 2021-08-12 DIAGNOSIS — Z170 Estrogen receptor positive status [ER+]: Secondary | ICD-10-CM

## 2021-08-12 DIAGNOSIS — K219 Gastro-esophageal reflux disease without esophagitis: Secondary | ICD-10-CM | POA: Insufficient documentation

## 2021-08-12 DIAGNOSIS — Z800 Family history of malignant neoplasm of digestive organs: Secondary | ICD-10-CM | POA: Diagnosis not present

## 2021-08-12 DIAGNOSIS — Z8041 Family history of malignant neoplasm of ovary: Secondary | ICD-10-CM | POA: Insufficient documentation

## 2021-08-12 DIAGNOSIS — Z801 Family history of malignant neoplasm of trachea, bronchus and lung: Secondary | ICD-10-CM | POA: Diagnosis not present

## 2021-08-12 DIAGNOSIS — I10 Essential (primary) hypertension: Secondary | ICD-10-CM | POA: Diagnosis not present

## 2021-08-12 DIAGNOSIS — Z79899 Other long term (current) drug therapy: Secondary | ICD-10-CM | POA: Insufficient documentation

## 2021-08-12 LAB — GENETIC SCREENING ORDER

## 2021-08-12 NOTE — Progress Notes (Signed)
New Breast Cancer Diagnosis: Left Breast UOQ  Did patient present with symptoms (if so, please note symptoms) or screening mammography?:Screening Mass    Location and Extent of disease :left breast. Located at 2 o'clock position, measured  3 mm in greatest dimension. Adenopathy no.  Histology per Pathology Report: grade 1, Invasive Ductal Carcinoma 08/04/2021  Receptor Status: ER(positive), PR (positive), Her2-neu (Pending), Ki-(5%)  Surgeon and surgical plan, if any:  Dr. Marlou Starks 08/07/2021 -At this point she is undecided between lumpectomy and mastectomy.  -I will refer her to medical and radiation oncology to discuss adjuvant therapy.  -I will also refer her to physical therapy for preoperative lymphedema testing.  -We will call her once we have the HER2 status.  -She will call us once she has made her final decision and we can proceed with surgical scheduling.    Medical oncologist, treatment if any:   Dr. Burr Medico 08/11/2021   Family History of Breast/Ovarian/Prostate Cancer: Paternal Cousin with Ovarian, Maternal Cousin with Ovarian.  Lymphedema issues, if any: No     Pain issues, if any: No     SAFETY ISSUES: Prior radiation? No Pacemaker/ICD? No Possible current pregnancy? Postmenopausal Is the patient on methotrexate? No  Current Complaints / other details:   Genetic:

## 2021-08-13 ENCOUNTER — Encounter: Payer: Self-pay | Admitting: Genetic Counselor

## 2021-08-13 ENCOUNTER — Telehealth: Payer: Self-pay | Admitting: *Deleted

## 2021-08-13 ENCOUNTER — Encounter: Payer: Self-pay | Admitting: *Deleted

## 2021-08-13 DIAGNOSIS — Z800 Family history of malignant neoplasm of digestive organs: Secondary | ICD-10-CM | POA: Insufficient documentation

## 2021-08-13 DIAGNOSIS — Z170 Estrogen receptor positive status [ER+]: Secondary | ICD-10-CM

## 2021-08-13 DIAGNOSIS — Z8041 Family history of malignant neoplasm of ovary: Secondary | ICD-10-CM | POA: Insufficient documentation

## 2021-08-13 DIAGNOSIS — C50412 Malignant neoplasm of upper-outer quadrant of left female breast: Secondary | ICD-10-CM

## 2021-08-13 NOTE — Telephone Encounter (Signed)
Called and spoke with patient to follow from her new patient appt and assess navigation needs. Patient states she would like to proceed with scheduling for a lumpectomy and then if genetics comes back positive may change her mind.  She said she had already placed a call to Dr. Ethlyn Gallery office regarding this.  She also mentioned seeing a plastic surgeon in case she changed to a mastectomy. Informed her I would go ahead and place a referral for Dr. Marla Roe so she would have all the information. Patient verbalized understanding. Referral placed. Contact information given. Encouraged her to call should she have any questions or concerns.

## 2021-08-13 NOTE — Progress Notes (Signed)
REFERRING PROVIDER: Truitt Merle, MD [address] [place], [place] [address]  PRIMARY PROVIDER:  [name], MD  PRIMARY REASON FOR VISIT:  1. Malignant neoplasm of upper-outer quadrant of left breast in female, estrogen receptor positive ([place])   2. Family history of colon cancer   3. Family history of ovarian cancer    HISTORY OF PRESENT ILLNESS:   Wanda Castro, a 56 y.o. female, was seen for a [facility] cancer genetics consultation at the request of Dr. Burr Medico due to a personal and family history of cancer.  Wanda Castro presents to clinic today to discuss the possibility of a hereditary predisposition to cancer, to discuss genetic testing, and to further clarify her future cancer risks, as well as potential cancer risks for family members.   In January 2023, at the age of 12, Wanda Castro was diagnosed with invasive ductal carcinoma of the left breast. The treatment plan is pending.   CANCER HISTORY:  Oncology History  Malignant neoplasm of upper-outer quadrant of left breast in female, estrogen receptor positive ([place])  07/31/2021 Imaging   CLINICAL DATA:  Screening recall for a left breast asymmetry.   EXAM: DIGITAL DIAGNOSTIC UNILATERAL LEFT MAMMOGRAM WITH TOMOSYNTHESIS AND CAD; ULTRASOUND LEFT BREAST LIMITED  IMPRESSION: 1. There is an indeterminate 3 mm mass in the left breast at 2 o'clock.   2.  No evidence of left axillary lymphadenopathy.   08/04/2021 Initial Biopsy   Diagnosis Breast, left, needle core biopsy, UOQ, 2 o'clock, 3cmfn, ribbon clip - INVASIVE DUCTAL CARCINOMA - DUCTAL CARCINOMA IN SITU - SEE COMMENT  Microscopic Comment Based on the biopsy, the carcinoma appears Nottingham grade 1 of 3 and measures 0.5 cm in greatest linear extent.  PROGNOSTIC INDICATORS Results: The tumor cells are EQUIVOCAL for Her2 (2+). Her2 by FISH will be performed and results reported separately. Estrogen Receptor: 100%, POSITIVE, STRONG STAINING INTENSITY Progesterone  Receptor: 60%, POSITIVE, STRONG STAINING INTENSITY Proliferation Marker Ki67: 5%   08/11/2021 Initial Diagnosis   Malignant neoplasm of upper-outer quadrant of left breast in female, estrogen receptor positive ([place])      RISK FACTORS:  Menarche was at age 89.  First live birth at age: n/a.  OCP use for approximately 0 years.  Ovaries intact: yes.  Uterus intact: yes.  Menopausal status: postmenopausal, menopause in her late 38s HRT use: 0 years. Colonoscopy: yes; normal. Mammogram within the last year: yes. Any excessive radiation exposure in the past: no  Past Medical History:  Diagnosis Date   GERD (gastroesophageal reflux disease)    HTN (hypertension)    Hypertension    Migraine    Right ureteral stone    Wears contact lenses     Past Surgical History:  Procedure Laterality Date   BREAST BIOPSY Left 08/04/2021   COLONOSCOPY  2015 approx    Social History   Socioeconomic History   Marital status: Single    Spouse name: Not on file   Number of children: 0   Years of education: Not on file   Highest education level: Not on file  Occupational History   Occupation: She works in administration in [place] Use   Smoking status: Never   Smokeless tobacco: Never  Substance and Sexual Activity   Alcohol use: No   Drug use: No   Sexual activity: Not on file  Other Topics Concern   Not on file  Social History Narrative   Not on file   Social Determinants of Health  Financial Resource Strain: Not on file  Food Insecurity: Not on file  Transportation Needs: Not on file  Physical Activity: Not on file  Stress: Not on file  Social Connections: Not on file     FAMILY HISTORY:  We obtained a detailed, 4-generation family history.  Significant diagnoses are listed below: Family History  Problem Relation Age of Onset   Atrial fibrillation Mother    COPD Mother    Hypertension Father    Renal cancer Father 40   Cancer Maternal Uncle  61       colon cancer   Cancer Paternal Aunt        small cell lung cancer   Prostate cancer Paternal Uncle    Prostate cancer Paternal Uncle    Ovarian cancer Cousin 48       maternal first cousin   Lung cancer Cousin        paternal first cousin   Lung cancer Cousin        paternal first cousin   Uterine cancer Cousin        paternal first cousin, dx. 47s   Ovarian cancer Cousin        dx. 56s   Colon cancer Cousin        paternal first cousin, dx. 22s     Wanda Castro's maternal uncle was diagnosed with colon cancer at age 64, he is deceased. She has a maternal cousin who was diagnosed with ovarian cancer at age 86, she died at age 86. Her father was diagnosed with renal cancer at age 65, he had a nephrectomy and died due to metastatic renal cancer at age 56. Her paternal aunt was diagnosed with lung cancer, she died due to metastatic lung cancer. She has two paternal uncles diagnosed with prostate cancer, one is deceased. She has two paternal cousins diagnosed with lung cancer who are deceased and a paternal cousin diagnosed with colon cancer in his 71s.   [name] is unaware of previous family history of genetic testing for hereditary cancer risks. There is no reported Ashkenazi Jewish ancestry.   GENETIC COUNSELING ASSESSMENT: Wanda Castro is a 56 y.o. female with a personal and family history of cancer which is somewhat suggestive of a hereditary cancer syndrome and predisposition to cancer given multiple generations affected by cancer. We, therefore, discussed and recommended the following at today's visit.   DISCUSSION: We discussed that 5 - 10% of cancer is hereditary, with most cases of hereditary breast cancer associated with mutations in BRCA1/2.  There are other genes that can be associated with hereditary breast and ovarian cancer syndromes. Type of cancer risk and level of risk are gene-specific. We discussed that testing is beneficial for several reasons including knowing how to  follow individuals after completing their treatment, identifying whether potential treatment options would be beneficial, and understanding if other family members could be at risk for cancer and allowing them to undergo genetic testing.   We reviewed the characteristics, features and inheritance patterns of hereditary cancer syndromes. We also discussed genetic testing, including the appropriate family members to test, the process of testing, insurance coverage and turn-around-time for results. We discussed the implications of a negative, positive and/or variant of uncertain significant result. In order to get genetic test results in a timely manner so that Ms. Abrams can use these genetic test results for surgical decisions, we recommended Ms. Masse pursue genetic testing for the BRCAplus panel. Once complete, we recommend Ms. Pakula pursue reflex genetic  testing to a more comprehensive gene panel.   Ms. Wnek  was offered a common hereditary cancer panel (47 genes) and an expanded pan-cancer panel (77 genes). Ms. Schoon was informed of the benefits and limitations of each panel, including that expanded pan-cancer panels contain genes that do not have clear management guidelines at this point in time.  We also discussed that as the number of genes included on a panel increases, the chances of variants of uncertain significance increases.  After considering the benefits and limitations of each gene panel, Ms. Liszewski elected to have [facility] CustomNext Panel (47 genes+ 5 renal cancer genes).  The CustomNext-Cancer+RNAinsight panel offered by [facility] includes sequencing and rearrangement analysis for the following 47 genes:  APC, ATM, AXIN2, BARD1, BMPR1A, BRCA1, BRCA2, BRIP1, CDH1, CDK4, CDKN2A, CHEK2, CTNNA1, DICER1, EPCAM, GREM1, HOXB13, KIT, MEN1, MLH1, MSH2, MSH3, MSH6, MUTYH, NBN, NF1, NTHL1, PALB2, PDGFRA, PMS2, POLD1, POLE, PTEN, RAD50, RAD51C, RAD51D, SDHA, SDHB, SDHC, SDHD, SMAD4, SMARCA4, STK11, TP53,  TSC1, TSC2, and VHL.  RNA data is routinely analyzed for use in variant interpretation for all genes. The five additional renal cancer genes included are BAP1, FH, FLCN, MET, MITF.  Based on Ms. Caradonna's personal and family history of cancer, she meets medical criteria for genetic testing. Despite that she meets criteria, she may still have an out of pocket cost. We discussed that if her out of pocket cost for testing is over $100, the laboratory should contact them to discuss self-pay prices, patient pay assistance programs, if applicable, and other billing options.   PLAN: After considering the risks, benefits, and limitations, Ms. Creque provided informed consent to pursue genetic testing and the blood sample was sent to [facility] for analysis of the CustomNext Panel (52 genes). Results should be available within approximately 1-2 weeks' time, at which point they will be disclosed by telephone to Ms. Schwieger, as will any additional recommendations warranted by these results. Ms. Mcglinn will receive a summary of her genetic counseling visit and a copy of her results once available. This information will also be available in Epic.   Ms. Deweese's questions were answered to her satisfaction today. Our contact information was provided should additional questions or concerns arise. Thank you for the referral and allowing [name] to share in the care of your patient.   Lucille Passy, MS, [facility] Genetic Counselor [email or web address] (P) [phone]  The patient was seen for a total of 40 minutes in face-to-face genetic counseling.  The patient brought her maternal cousin. Drs. Lindi Adie and/or Burr Medico were available to discuss this case as needed.  _______________________________________________________________________ For Office Staff:  Number of people involved in session: 2 Was an Intern/ student involved with case: no

## 2021-08-14 ENCOUNTER — Ambulatory Visit: Payer: Self-pay | Admitting: General Surgery

## 2021-08-14 DIAGNOSIS — C50412 Malignant neoplasm of upper-outer quadrant of left female breast: Secondary | ICD-10-CM

## 2021-08-14 DIAGNOSIS — Z170 Estrogen receptor positive status [ER+]: Secondary | ICD-10-CM

## 2021-08-15 ENCOUNTER — Other Ambulatory Visit: Payer: Self-pay | Admitting: General Surgery

## 2021-08-15 DIAGNOSIS — C50412 Malignant neoplasm of upper-outer quadrant of left female breast: Secondary | ICD-10-CM

## 2021-08-18 ENCOUNTER — Encounter: Payer: Self-pay | Admitting: *Deleted

## 2021-08-19 ENCOUNTER — Other Ambulatory Visit: Payer: Self-pay

## 2021-08-19 ENCOUNTER — Encounter: Payer: Self-pay | Admitting: Plastic Surgery

## 2021-08-19 ENCOUNTER — Ambulatory Visit: Payer: BC Managed Care – PPO | Attending: General Surgery | Admitting: Rehabilitation

## 2021-08-19 ENCOUNTER — Encounter: Payer: Self-pay | Admitting: Rehabilitation

## 2021-08-19 ENCOUNTER — Ambulatory Visit: Payer: BC Managed Care – PPO | Admitting: Plastic Surgery

## 2021-08-19 VITALS — BP 144/82 | HR 73 | Ht 63.0 in | Wt 164.0 lb

## 2021-08-19 DIAGNOSIS — C50412 Malignant neoplasm of upper-outer quadrant of left female breast: Secondary | ICD-10-CM | POA: Insufficient documentation

## 2021-08-19 DIAGNOSIS — R293 Abnormal posture: Secondary | ICD-10-CM | POA: Insufficient documentation

## 2021-08-19 DIAGNOSIS — Z170 Estrogen receptor positive status [ER+]: Secondary | ICD-10-CM | POA: Insufficient documentation

## 2021-08-19 NOTE — Therapy (Signed)
OUTPATIENT PHYSICAL THERAPY BREAST CANCER BASELINE EVALUATION   Patient Name: Wanda Castro MRN: [id number] DOB:11/22/1965, 56 y.o., female Today's Date: 08/19/2021   PT End of Session - 08/19/21 1455     Visit Number 1    Number of Visits 2    Date for PT Re-Evaluation 09/30/21    Authorization Type BCBS    PT Start Time 1500    Activity Tolerance Patient tolerated treatment well    Behavior During Therapy [facility] for tasks assessed/performed             Past Medical History:  Diagnosis Date   GERD (gastroesophageal reflux disease)    HTN (hypertension)    Hypertension    Migraine    Right ureteral stone    Wears contact lenses    Past Surgical History:  Procedure Laterality Date   BREAST BIOPSY Left 08/04/2021   COLONOSCOPY  2015 approx   Patient Active Problem List   Diagnosis Date Noted   Family history of colon cancer 08/13/2021   Family history of ovarian cancer 08/13/2021   Malignant neoplasm of upper-outer quadrant of left breast in female, estrogen receptor positive ([place]) 08/11/2021      REFERRING PROVIDER: Jovita Kussmaul, MD  REFERRING DIAG:C50.412,Z17.0 (ICD-10-CM) - Malignant neoplasm of upper-outer quadrant of left breast in female, estrogen receptor positive ([place])   THERAPY DIAG:  Malignant neoplasm of upper-outer quadrant of left breast in female, estrogen receptor positive ([place])  Abnormal posture  ONSET DATE: 08/19/21  SUBJECTIVE                                                                                                                                                                                           SUBJECTIVE STATEMENT: Patient reports she is here today to be seen by her medical team for her newly diagnosed left breast cancer.   PERTINENT HISTORY:  GERD, HTN, migraines.   PATIENT GOALS   reduce lymphedema risk and learn post op HEP.   PAIN:  Are you having pain? Yes  PRECAUTIONS: Active CA  WEIGHT BEARING RESTRICTIONS  No  FALLS:  Has patient fallen in last 6 months? 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  LEISURE: Not currently   PRIOR LEVEL OF FUNCTION: Independent   OBJECTIVE  COGNITION:  Overall cognitive status: Within functional limits for tasks assessed    POSTURE:  Forward head and rounded shoulders posture  UPPER EXTREMITY AROM/PROM:  A/PROM Right 08/19/2021 Left 08/19/2021  Shoulder extension  65  Shoulder flexion  150  Shoulder abduction  160  Shoulder internal rotation  75  Shoulder external rotation  95    (Blank rows = not tested)    CERVICAL AROM: All within normal limits:    LYMPHEDEMA ASSESSMENTS:   LANDMARK RIGHT 08/19/2021 LEFT 08/19/2021  10 cm proximal to olecranon process  30.5  Olecranon process  24.5  10 cm proximal to ulnar styloid process  20.6  Just proximal to ulnar styloid process  15.5  Across hand at thumb web space  18.5  At base of 2nd digit  6.3  (Blank rows = not tested)   L-DEX LYMPHEDEMA SCREENING:  The patient was assessed using the L-Dex machine today to produce a lymphedema index baseline score. The patient will be reassessed on a regular basis (typically every 3 months) to obtain new L-Dex scores. If the score is > 6.5 points away from his/her baseline score indicating onset of subclinical lymphedema, it will be recommended to wear a compression garment for 4 weeks, 12 hours per day and then be reassessed. If the score continues to be > 6.5 points from baseline at reassessment, we will initiate lymphedema treatment. Assessing in this manner has a 95% rate of preventing clinically significant lymphedema.   L-DEX FLOWSHEETS - 08/19/21 1500       L-DEX LYMPHEDEMA SCREENING   Measurement Type Unilateral    L-DEX MEASUREMENT EXTREMITY Upper Extremity    POSITION  Standing    DOMINANT SIDE Right    At  Risk Side Left    BASELINE SCORE (UNILATERAL) -0.5              QUICK DASH SURVEY: 0%   PATIENT EDUCATION:  Education details: Lymphedema risk reduction and post op shoulder/posture HEP Person educated: Patient Education method: Explanation, Demonstration, Handout Education comprehension: Patient verbalized understanding and returned demonstration   HOME EXERCISE PROGRAM: Patient was instructed today in a home exercise program today for post op shoulder range of motion. These included active assist shoulder flexion in sitting, scapular retraction, wall walking with shoulder abduction, and hands behind head external rotation.  She was encouraged to do these twice a day, holding 3 seconds and repeating 5 times when permitted by her physician.   ASSESSMENT:  CLINICAL IMPRESSION: Pt is planning to have a lumpectomy with SLNB but may have a mastectomy pending genetic testing. She will benefit from a post op PT reassessment to determine needs and from L-Dex screens every 3 months for 2 years to detect subclinical lymphedema.  Pt will benefit from skilled therapeutic intervention to improve on the following deficits: Decreased knowledge of precautions, impaired UE functional use, pain, decreased ROM, postural dysfunction.   PT treatment/interventions: ADL/self-care home management, pt/family education, therapeutic exercise  REHAB POTENTIAL: Excellent  CLINICAL DECISION MAKING: Stable/uncomplicated  EVALUATION COMPLEXITY: Low   GOALS: Goals reviewed with patient? YES  LONG TERM GOALS: (STG=LTG)   Name Target Date Goal status  1 Pt will be able to verbalize understanding of pertinent lymphedema risk reduction practices relevant to her dx specifically related to skin care.  Baseline:  No knowledge 08/19/2021 Achieved at eval  2 Pt will be able to return demo and/or verbalize understanding of the post op HEP related to regaining shoulder ROM. Baseline:  No knowledge 08/19/2021  Achieved at eval  3 Pt will be able to verbalize understanding of the importance of attending the post op After Breast CA Class for further lymphedema risk reduction education and therapeutic exercise.  Baseline:  No knowledge 08/19/2021 Achieved at eval  4 Pt will demo she has regained full shoulder ROM and function post operatively compared to baselines.  Baseline: See objective measurements taken today. 09/30/21      PLAN: PT FREQUENCY/DURATION: EVAL and 1 follow up appointment.   PLAN FOR NEXT SESSION: will reassess 3-4 weeks post op to determine needs.   Patient will follow up at outpatient cancer rehab 3-4 weeks following surgery.  If the patient requires physical therapy at that time, a specific plan will be dictated and sent to the referring physician for approval. The patient was educated today on appropriate basic range of motion exercises to begin post operatively and the importance of attending the After Breast Cancer class following surgery.  Patient was educated today on lymphedema risk reduction practices as it pertains to recommendations that will benefit the patient immediately following surgery.  She verbalized good understanding.    Physical Therapy Information for After Breast Cancer Surgery/Treatment:  Lymphedema is a swelling condition that you may be at risk for in your arm if you have lymph nodes removed from the armpit area.  After a sentinel node biopsy, the risk is approximately 5-9% and is higher after an axillary node dissection.  There is treatment available for this condition and it is not life-threatening.  Contact your physician or physical therapist with concerns. You may begin the 4 shoulder/posture exercises (see additional sheet) when permitted by your physician (typically a week after surgery).  If you have drains, you may need to wait until those are removed before beginning range of motion exercises.  A general recommendation is to not lift your arms above  shoulder height until drains are removed.  These exercises should be done to your tolerance and gently.  This is not a "no pain/no gain" type of recovery so listen to your body and stretch into the range of motion that you can tolerate, stopping if you have pain.  If you are having immediate reconstruction, ask your plastic surgeon about doing exercises as he or she may want you to wait. We encourage you to attend the free one time ABC (After Breast Cancer) class offered by [facility].  You will learn information related to lymphedema risk, prevention and treatment and additional exercises to regain mobility following surgery.  You can call [phone] for more information.  This is offered the 1st and 3rd Monday of each month.  You only attend the class one time. While undergoing any medical procedure or treatment, try to avoid blood pressure being taken or needle sticks from occurring on the arm on the side of cancer.   This recommendation begins after surgery and continues for the rest of your life.  This may help reduce your risk of getting lymphedema (swelling in your arm). An excellent resource for those seeking information on lymphedema is the National Lymphedema Network's web site. It can be accessed at [place].org If you notice swelling in your hand, arm or breast at any time following surgery (even if it is many years from now), please contact your doctor or physical therapist to discuss this.  Lymphedema can be treated at any time but it is easier for you if it is treated early on.  If you feel like your shoulder motion is not returning to normal in a reasonable amount of time, please contact your surgeon or physical therapist.  [facility] [phone]. [address], [address], [place] [address]  ABC CLASS After Breast Cancer Class  After Breast Cancer Class is a specially designed exercise class to assist you in a safe  recover after having breast cancer surgery.  In this class you will learn how to get back to full function whether your drains were just removed or if you had surgery a month ago.  This one-time class is held the 1st and 3rd Monday of every month from 11:00 a.m. until 12:00 noon virtually.  This class is FREE and space is limited. For more information or to register for the next available class, call [phone].  Class Goals  Understand specific stretches to improve the flexibility of you chest and shoulder. Learn ways to safely strengthen your upper body and improve your posture. Understand the warning signs of infection and why you may be at risk for an arm infection. Learn about Lymphedema and prevention.  ** You do not attend this class until after surgery.  Drains must be removed to participate  Patient was instructed today in a home exercise program today for post op shoulder range of motion. These included active assist shoulder flexion in sitting, scapular retraction, wall walking with shoulder abduction, and hands behind head external rotation.  She was encouraged to do these twice a day, holding 3 seconds and repeating 5 times when permitted by her physician.    Shan Levans, PT

## 2021-08-19 NOTE — Progress Notes (Signed)
Patient ID: Wanda Castro, female    DOB: [birth year], 56 y.o.   MRN: [id number]   Chief Complaint  Patient presents with   Advice Only   Breast Cancer    The patient is a 56 year old female here with a friend for a consultation for breast reconstruction.  She underwent a routine mammogram when asymmetries were noted and she had a biopsy done January 23.  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  It is estrogen and progesterone positive and HER2 negative with a Ki-67 of 5%.  She is seeing Dr. Marlou Starks.  She is thinking about doing a partial mastectomy and understands that she would need radiation postoperatively.  Her genetics is pending and should be back in a week or 2.  If it is positive she will likely go for bilateral mastectomies.  She is not sure if she wants to do reconstruction.  She is 5 feet 3 inches tall and weighs 164 pounds.  Her preoperative bra size is a 60. She does not have a family history of breast cancer but has a strong family history of cancer.   She does not smoke and does not have diabetes.  He has mild asymmetry with the left breast being larger than the right and grade 2 ptosis bilaterally.   Review of Systems  Constitutional: Negative.   HENT: Negative.    Eyes: Negative.   Respiratory: Negative.  Negative for chest tightness.   Cardiovascular: Negative.  Negative for leg swelling.  Gastrointestinal: Negative.   Endocrine: Negative.   Genitourinary: Negative.   Musculoskeletal: Negative.   Skin:  Negative for color change and wound.  Neurological: Negative.   Hematological: Negative.   Psychiatric/Behavioral: Negative.     Past Medical History:  Diagnosis Date   GERD (gastroesophageal reflux disease)    HTN (hypertension)    Hypertension    Migraine    Right ureteral stone    Wears contact lenses     Past Surgical History:  Procedure Laterality Date   BREAST BIOPSY Left 08/04/2021   COLONOSCOPY  2015 approx      Current  Outpatient Medications:    Calcium Carb-Cholecalciferol (CALCIUM 600 + D PO), Take 1 tablet by mouth daily., Disp: , Rfl:    losartan-hydrochlorothiazide (HYZAAR) 100-12.5 MG tablet, Take 0.5 tablets by mouth every morning. , Disp: , Rfl:    mometasone (ELOCON) 0.1 % cream, mometasone 0.1 % topical cream  APPLY TO EXTERNAL EAR ONCE DAILY AS NEEDED ITCHING, Disp: , Rfl:    ondansetron (ZOFRAN) 4 MG tablet, Take 1 tablet (4 mg total) by mouth every 6 (six) hours as needed for nausea or vomiting., Disp: 12 tablet, Rfl: 0   oxyCODONE-acetaminophen (PERCOCET) 5-325 MG tablet, Take 1 tablet by mouth every 4 (four) hours as needed for moderate pain., Disp: 20 tablet, Rfl: 0   tretinoin (RETIN-A) 0.05 % cream, Apply 1 application topically daily., Disp: , Rfl:    Objective:   Vitals:   08/19/21 1256  BP: (!) 144/82  Pulse: 73  SpO2: 97%    Physical Exam Vitals reviewed.  Constitutional:      Appearance: Normal appearance.  HENT:     Head: Normocephalic and atraumatic.  Cardiovascular:     Rate and Rhythm: Normal rate.     Pulses: Normal pulses.  Pulmonary:     Effort: Pulmonary effort is normal.  Abdominal:     General: There is no distension.     Palpations: Abdomen  is soft.     Tenderness: There is no abdominal tenderness.  Musculoskeletal:        General: No swelling or deformity.  Skin:    General: Skin is warm.     Coloration: Skin is not jaundiced.     Findings: No bruising.  Neurological:     Mental Status: She is alert and oriented to person, place, and time.  Psychiatric:        Mood and Affect: Mood normal.        Behavior: Behavior normal.        Thought Content: Thought content normal.    Assessment & Plan:  Malignant neoplasm of upper-outer quadrant of left breast in female, estrogen receptor positive ([place])  The options for reconstruction we explained to the patient / family for breast reconstruction.  There are two general categories of reconstruction.  We can  reconstruction a breast with implants or use the patient's own tissue.  These were further discussed as listed.  Breast reconstruction is an optional procedure and eligibility depends on the full spectrum of the health of the patient and any co-morbidities.  More than one surgery is often needed to complete the reconstruction process.  The process can take three to twelve months to complete.  The breasts will not be identical due to many factors such as rib differences, shoulder asymmetry and treatments such as radiation.  The goal is to get the breasts to look normal and symmetrical in clothes.  Scars are a part of surgery and may fade some in time but will always be present under clothes.  Surgery may be an option on the non-cancer breast to achieve more symmetry.  No matter which procedure is chosen there is always the risk of complications and even failure of the body to heal.  This could result in no breast.    The options for reconstruction include:  1. Placement of a tissue expander with Acellular dermal matrix. When the expander is the desired size surgery is performed to remove the expander and place an implant.  In some cases the implant can be placed without an expander.  2. Autologous reconstruction can include using a muscle or tissue from another area of the body to create a breast.  3. Combined procedures (ie. latissismus dorsi flap) can be done with an expander / implant placed under the muscle.   The risks, benefits, scars and recovery time were discussed for each of the above. Risks include bleeding, infection, hematoma, seroma, scarring, pain, wound healing complications, flap loss, fat necrosis, capsular contracture, need for implant removal, donor site complications, bulge, hernia, umbilical necrosis, need for urgent reoperation, and need for dressing changes.   The procedure the patient selected / that was best for the patient, was then discussed in further detail.  Total time: 45  minutes. This includes time spent with the patient during the visit as well as time spent before and after the visit reviewing the chart, documenting the encounter, making phone calls and reviewing studies.   The patient will likely go with a partial mastectomy of the left breast if her genetics is negative.  If it is positive on her genetics she will likely go with bilateral mastectomies.  She is not sure she will do reconstruction or not.  If she did reconstruction it will be expanders and implant.  We will plan to talk in a week when she has her genetics back to make a final decision.  Pictures were obtained  of the patient and placed in the chart with the patient's or guardian's permission.   [place], DO

## 2021-08-25 ENCOUNTER — Encounter: Payer: Self-pay | Admitting: *Deleted

## 2021-08-25 ENCOUNTER — Telehealth: Payer: Self-pay | Admitting: Genetic Counselor

## 2021-08-25 ENCOUNTER — Encounter: Payer: Self-pay | Admitting: Genetic Counselor

## 2021-08-25 DIAGNOSIS — Z1379 Encounter for other screening for genetic and chromosomal anomalies: Secondary | ICD-10-CM | POA: Insufficient documentation

## 2021-08-25 NOTE — Telephone Encounter (Signed)
I contacted Wanda Castro to discuss her genetic testing results. No pathogenic variants were identified in the first 8 genes analyzed. Of note, we are still waiting on additional gene analysis and will contact her when they are available. Detailed clinic note to follow.  The test report has been scanned into EPIC and is located under the Molecular Pathology section of the Results Review tab.  A portion of the result report is included below for reference.   Wanda Passy, MS, [facility] Genetic Counselor [email or web address] (P) [phone]

## 2021-08-26 ENCOUNTER — Telehealth: Payer: BC Managed Care – PPO | Admitting: Plastic Surgery

## 2021-08-28 ENCOUNTER — Other Ambulatory Visit: Payer: Self-pay

## 2021-08-28 ENCOUNTER — Encounter (HOSPITAL_BASED_OUTPATIENT_CLINIC_OR_DEPARTMENT_OTHER): Payer: Self-pay | Admitting: General Surgery

## 2021-08-29 ENCOUNTER — Telehealth: Payer: BC Managed Care – PPO | Admitting: Plastic Surgery

## 2021-09-01 ENCOUNTER — Encounter (HOSPITAL_BASED_OUTPATIENT_CLINIC_OR_DEPARTMENT_OTHER)
Admission: RE | Admit: 2021-09-01 | Discharge: 2021-09-01 | Disposition: A | Discharge status: Home / Self Care | Payer: BC Managed Care – PPO | Source: Ambulatory Visit | Attending: General Surgery | Admitting: General Surgery

## 2021-09-01 DIAGNOSIS — Z01818 Encounter for other preprocedural examination: Secondary | ICD-10-CM | POA: Insufficient documentation

## 2021-09-01 DIAGNOSIS — Z79899 Other long term (current) drug therapy: Secondary | ICD-10-CM | POA: Diagnosis not present

## 2021-09-01 LAB — BASIC METABOLIC PANEL
Anion gap: 10 (ref 5–15)
BUN: 14 mg/dL (ref 6–20)
CO2: 27 mmol/L (ref 22–32)
Calcium: 9 mg/dL (ref 8.9–10.3)
Chloride: 102 mmol/L (ref 98–111)
Creatinine, Ser: 0.95 mg/dL (ref 0.44–1.00)
GFR, Estimated: >60 mL/min (ref >60)
Glucose, Bld: 94 mg/dL (ref 70–99)
Potassium: 3.8 mmol/L (ref 3.5–5.1)
Sodium: 139 mmol/L (ref 135–145)

## 2021-09-01 NOTE — Progress Notes (Signed)
° ° ° ° °  Enhanced Recovery after Surgery for Orthopedics Enhanced Recovery after Surgery is a protocol used to improve the stress on your body and your recovery after surgery.  Patient Instructions  The night before surgery:  No food after midnight. ONLY clear liquids after midnight  The day of surgery (if you do NOT have diabetes):  Drink ONE (1) Pre-Surgery Clear Ensure as directed.   This drink was given to you during your hospital  pre-op appointment visit. The pre-op nurse will instruct you on the time to drink the  Pre-Surgery Ensure depending on your surgery time. Finish the drink at the designated time by the pre-op nurse.  Nothing else to drink after completing the  Pre-Surgery Clear Ensure.  The day of surgery (if you have diabetes): Drink ONE (1) Gatorade 2 (G2) as directed. This drink was given to you during your hospital  pre-op appointment visit.  The pre-op nurse will instruct you on the time to drink the   Gatorade 2 (G2) depending on your surgery time. Color of the Gatorade may vary. Red is not allowed. Nothing else to drink after completing the  Gatorade 2 (G2).         If you have questions, please contact your surgeons office.    Surgical soap given to patient, verbalizes understanding.

## 2021-09-04 ENCOUNTER — Ambulatory Visit
Admission: RE | Admit: 2021-09-04 | Discharge: 2021-09-04 | Disposition: A | Discharge status: Home / Self Care | Payer: BC Managed Care – PPO | Source: Ambulatory Visit | Attending: General Surgery | Admitting: General Surgery

## 2021-09-04 DIAGNOSIS — Z170 Estrogen receptor positive status [ER+]: Secondary | ICD-10-CM

## 2021-09-04 DIAGNOSIS — C50412 Malignant neoplasm of upper-outer quadrant of left female breast: Secondary | ICD-10-CM

## 2021-09-05 ENCOUNTER — Ambulatory Visit (HOSPITAL_BASED_OUTPATIENT_CLINIC_OR_DEPARTMENT_OTHER): Payer: BC Managed Care – PPO | Admitting: Certified Registered"

## 2021-09-05 ENCOUNTER — Encounter (HOSPITAL_BASED_OUTPATIENT_CLINIC_OR_DEPARTMENT_OTHER)
Admission: RE | Disposition: A | Discharge status: Home / Self Care | Payer: Self-pay | Source: Home / Self Care | Attending: General Surgery

## 2021-09-05 ENCOUNTER — Encounter (HOSPITAL_BASED_OUTPATIENT_CLINIC_OR_DEPARTMENT_OTHER): Payer: Self-pay | Admitting: General Surgery

## 2021-09-05 ENCOUNTER — Ambulatory Visit (HOSPITAL_BASED_OUTPATIENT_CLINIC_OR_DEPARTMENT_OTHER)
Admission: RE | Admit: 2021-09-05 | Discharge: 2021-09-05 | Disposition: A | Discharge status: Home / Self Care | Payer: BC Managed Care – PPO | Attending: General Surgery | Admitting: General Surgery

## 2021-09-05 ENCOUNTER — Other Ambulatory Visit: Payer: Self-pay

## 2021-09-05 ENCOUNTER — Ambulatory Visit
Admission: RE | Admit: 2021-09-05 | Discharge: 2021-09-05 | Disposition: A | Discharge status: Home / Self Care | Payer: BC Managed Care – PPO | Source: Ambulatory Visit | Attending: General Surgery | Admitting: General Surgery

## 2021-09-05 DIAGNOSIS — I10 Essential (primary) hypertension: Secondary | ICD-10-CM | POA: Diagnosis not present

## 2021-09-05 DIAGNOSIS — C50412 Malignant neoplasm of upper-outer quadrant of left female breast: Secondary | ICD-10-CM | POA: Diagnosis present

## 2021-09-05 DIAGNOSIS — Z170 Estrogen receptor positive status [ER+]: Secondary | ICD-10-CM | POA: Diagnosis not present

## 2021-09-05 DIAGNOSIS — Z79899 Other long term (current) drug therapy: Secondary | ICD-10-CM

## 2021-09-05 HISTORY — DX: Anxiety disorder, unspecified: F41.9

## 2021-09-05 HISTORY — PX: BREAST LUMPECTOMY WITH RADIOACTIVE SEED AND SENTINEL LYMPH NODE BIOPSY: SHX6550

## 2021-09-05 HISTORY — PX: BREAST LUMPECTOMY: SHX2

## 2021-09-05 HISTORY — DX: Cardiac murmur, unspecified: R01.1

## 2021-09-05 SURGERY — BREAST LUMPECTOMY WITH RADIOACTIVE SEED AND SENTINEL LYMPH NODE BIOPSY
Anesthesia: Regional | Site: Breast | Laterality: Left

## 2021-09-05 MED ORDER — PROPOFOL 500 MG/50ML IV EMUL
INTRAVENOUS | Status: DC | PRN
  Administered 2021-09-05: 25 ug/kg/min via INTRAVENOUS

## 2021-09-05 MED ORDER — OXYCODONE HCL 5 MG PO TABS: 5.0000 mg | ORAL_TABLET | Freq: Four times a day (QID) | ORAL | 0 refills | Status: DC | PRN

## 2021-09-05 MED ORDER — CHLORHEXIDINE GLUCONATE CLOTH 2 % EX PADS: 6.0000 | MEDICATED_PAD | Freq: Once | CUTANEOUS | Status: DC

## 2021-09-05 MED ORDER — GABAPENTIN 300 MG PO CAPS
300.0000 mg | ORAL_CAPSULE | ORAL | Status: AC
  Administered 2021-09-05: 300 mg via ORAL

## 2021-09-05 MED ORDER — CELECOXIB 200 MG PO CAPS
ORAL_CAPSULE | ORAL | Status: AC
  Filled 2021-09-05: qty 1

## 2021-09-05 MED ORDER — FENTANYL CITRATE (PF) 100 MCG/2ML IJ SOLN
INTRAMUSCULAR | Status: DC | PRN
  Administered 2021-09-05: 50 ug via INTRAVENOUS

## 2021-09-05 MED ORDER — FENTANYL CITRATE (PF) 100 MCG/2ML IJ SOLN
INTRAMUSCULAR | Status: AC
  Filled 2021-09-05: qty 2

## 2021-09-05 MED ORDER — LIDOCAINE HCL (CARDIAC) PF 100 MG/5ML IV SOSY
PREFILLED_SYRINGE | INTRAVENOUS | Status: DC | PRN
  Administered 2021-09-05: 30 mg via INTRAVENOUS

## 2021-09-05 MED ORDER — DEXAMETHASONE SODIUM PHOSPHATE 4 MG/ML IJ SOLN
INTRAMUSCULAR | Status: DC | PRN
  Administered 2021-09-05: 4 mg via INTRAVENOUS

## 2021-09-05 MED ORDER — MIDAZOLAM HCL 2 MG/2ML IJ SOLN
INTRAMUSCULAR | Status: AC
  Filled 2021-09-05: qty 2

## 2021-09-05 MED ORDER — BUPIVACAINE-EPINEPHRINE 0.25% -1:200000 IJ SOLN
INTRAMUSCULAR | Status: DC | PRN
  Administered 2021-09-05: 15 mL

## 2021-09-05 MED ORDER — CEFAZOLIN SODIUM-DEXTROSE 2-4 GM/100ML-% IV SOLN
2.0000 g | INTRAVENOUS | Status: AC
  Administered 2021-09-05: 2 g via INTRAVENOUS

## 2021-09-05 MED ORDER — FENTANYL CITRATE (PF) 100 MCG/2ML IJ SOLN
50.0000 ug | Freq: Once | INTRAMUSCULAR | Status: AC
  Administered 2021-09-05: 50 ug via INTRAVENOUS

## 2021-09-05 MED ORDER — MIDAZOLAM HCL 2 MG/2ML IJ SOLN
2.0000 mg | Freq: Once | INTRAMUSCULAR | Status: AC
  Administered 2021-09-05: 2 mg via INTRAVENOUS

## 2021-09-05 MED ORDER — GABAPENTIN 300 MG PO CAPS
ORAL_CAPSULE | ORAL | Status: AC
  Filled 2021-09-05: qty 1

## 2021-09-05 MED ORDER — FENTANYL CITRATE (PF) 100 MCG/2ML IJ SOLN: 25.0000 ug | INTRAMUSCULAR | Status: DC | PRN

## 2021-09-05 MED ORDER — ACETAMINOPHEN 10 MG/ML IV SOLN: 1000.0000 mg | Freq: Once | INTRAVENOUS | Status: DC | PRN

## 2021-09-05 MED ORDER — ACETAMINOPHEN 500 MG PO TABS
ORAL_TABLET | ORAL | Status: AC
  Filled 2021-09-05: qty 1

## 2021-09-05 MED ORDER — CEFAZOLIN SODIUM-DEXTROSE 2-4 GM/100ML-% IV SOLN
INTRAVENOUS | Status: AC
  Filled 2021-09-05: qty 100

## 2021-09-05 MED ORDER — LACTATED RINGERS IV SOLN: INTRAVENOUS | Status: DC

## 2021-09-05 MED ORDER — AMISULPRIDE (ANTIEMETIC) 5 MG/2ML IV SOLN
INTRAVENOUS | Status: AC
  Filled 2021-09-05: qty 4

## 2021-09-05 MED ORDER — PROPOFOL 10 MG/ML IV BOLUS
INTRAVENOUS | Status: DC | PRN
  Administered 2021-09-05: 150 mg via INTRAVENOUS

## 2021-09-05 MED ORDER — MAGTRACE LYMPHATIC TRACER
INTRAMUSCULAR | Status: DC | PRN
Start: 2021-09-05 — End: 2021-09-05
  Administered 2021-09-05: 2 mL via INTRAMUSCULAR

## 2021-09-05 MED ORDER — CELECOXIB 200 MG PO CAPS
200.0000 mg | ORAL_CAPSULE | ORAL | Status: AC
  Administered 2021-09-05: 200 mg via ORAL

## 2021-09-05 MED ORDER — DEXAMETHASONE SODIUM PHOSPHATE 10 MG/ML IJ SOLN
INTRAMUSCULAR | Status: DC | PRN
  Administered 2021-09-05: 5 mg

## 2021-09-05 MED ORDER — ROPIVACAINE HCL 5 MG/ML IJ SOLN
INTRAMUSCULAR | Status: DC | PRN
  Administered 2021-09-05: 30 mL

## 2021-09-05 MED ORDER — ACETAMINOPHEN 500 MG PO TABS
1000.0000 mg | ORAL_TABLET | ORAL | Status: AC
  Administered 2021-09-05: 1000 mg via ORAL

## 2021-09-05 MED ORDER — ONDANSETRON HCL 4 MG/2ML IJ SOLN
INTRAMUSCULAR | Status: DC | PRN
  Administered 2021-09-05: 4 mg via INTRAVENOUS

## 2021-09-05 MED ORDER — EPHEDRINE 5 MG/ML INJ
INTRAVENOUS | Status: AC
  Filled 2021-09-05: qty 5

## 2021-09-05 MED ORDER — AMISULPRIDE (ANTIEMETIC) 5 MG/2ML IV SOLN
10.0000 mg | Freq: Once | INTRAVENOUS | Status: AC
  Administered 2021-09-05: 10 mg via INTRAVENOUS

## 2021-09-05 SURGICAL SUPPLY — 48 items
ADH SKN CLS APL DERMABOND .7 (GAUZE/BANDAGES/DRESSINGS) ×1
APL PRP STRL LF DISP 70% ISPRP (MISCELLANEOUS) ×1
APPLIER CLIP 9.375 MED OPEN (MISCELLANEOUS) ×4
APR CLP MED 9.3 20 MLT OPN (MISCELLANEOUS) ×2
BLADE SURG 15 STRL LF DISP TIS (BLADE) ×2 IMPLANT
BLADE SURG 15 STRL SS (BLADE) ×2
CANISTER SUC SOCK COL 7IN (MISCELLANEOUS) IMPLANT
CANISTER SUCT 1200ML W/VALVE (MISCELLANEOUS) IMPLANT
CHLORAPREP W/TINT 26 (MISCELLANEOUS) ×3 IMPLANT
CLIP APPLIE 9.375 MED OPEN (MISCELLANEOUS) ×2 IMPLANT
COVER BACK TABLE 60X90IN (DRAPES) ×3 IMPLANT
COVER MAYO STAND STRL (DRAPES) ×3 IMPLANT
COVER PROBE W GEL 5X96 (DRAPES) ×4 IMPLANT
DERMABOND ADVANCED (GAUZE/BANDAGES/DRESSINGS) ×1
DERMABOND ADVANCED .7 DNX12 (GAUZE/BANDAGES/DRESSINGS) ×2 IMPLANT
DRAPE LAPAROSCOPIC ABDOMINAL (DRAPES) ×3 IMPLANT
DRAPE UTILITY XL STRL (DRAPES) ×3 IMPLANT
ELECT COATED BLADE 2.86 ST (ELECTRODE) ×3 IMPLANT
ELECT REM PT RETURN 9FT ADLT (ELECTROSURGICAL) ×2
ELECTRODE REM PT RTRN 9FT ADLT (ELECTROSURGICAL) ×2 IMPLANT
GLOVE SURG ENC MOIS LTX SZ7.5 (GLOVE) ×4 IMPLANT
GLOVE SURG POLYISO LF SZ6.5 (GLOVE) ×1 IMPLANT
GLOVE SURG UNDER POLY LF SZ6.5 (GLOVE) ×1 IMPLANT
GLOVE SURG UNDER POLY LF SZ7 (GLOVE) ×1 IMPLANT
GOWN STRL REUS W/ TWL LRG LVL3 (GOWN DISPOSABLE) ×4 IMPLANT
GOWN STRL REUS W/TWL LRG LVL3 (GOWN DISPOSABLE) ×6
ILLUMINATOR WAVEGUIDE N/F (MISCELLANEOUS) IMPLANT
KIT MARKER MARGIN INK (KITS) ×3 IMPLANT
LIGHT WAVEGUIDE WIDE FLAT (MISCELLANEOUS) IMPLANT
NDL HYPO 25X1 1.5 SAFETY (NEEDLE) ×2 IMPLANT
NDL SAFETY ECLIPSE 18X1.5 (NEEDLE) IMPLANT
NEEDLE HYPO 18GX1.5 SHARP (NEEDLE)
NEEDLE HYPO 25X1 1.5 SAFETY (NEEDLE) ×2 IMPLANT
NS IRRIG 1000ML POUR BTL (IV SOLUTION) IMPLANT
PACK BASIN DAY SURGERY FS (CUSTOM PROCEDURE TRAY) ×3 IMPLANT
PENCIL SMOKE EVACUATOR (MISCELLANEOUS) ×3 IMPLANT
SLEEVE SCD COMPRESS KNEE MED (STOCKING) ×3 IMPLANT
SPIKE FLUID TRANSFER (MISCELLANEOUS) IMPLANT
SPONGE T-LAP 18X18 ~~LOC~~+RFID (SPONGE) ×3 IMPLANT
SUT MON AB 4-0 PC3 18 (SUTURE) ×6 IMPLANT
SUT SILK 2 0 SH (SUTURE) IMPLANT
SUT VICRYL 3-0 CR8 SH (SUTURE) ×3 IMPLANT
SYR CONTROL 10ML LL (SYRINGE) ×3 IMPLANT
TOWEL GREEN STERILE FF (TOWEL DISPOSABLE) ×3 IMPLANT
TRACER MAGTRACE VIAL (MISCELLANEOUS) ×1 IMPLANT
TRAY FAXITRON CT DISP (TRAY / TRAY PROCEDURE) ×3 IMPLANT
TUBE CONNECTING 20X1/4 (TUBING) ×1 IMPLANT
YANKAUER SUCT BULB TIP NO VENT (SUCTIONS) ×1 IMPLANT

## 2021-09-05 NOTE — Anesthesia Postprocedure Evaluation (Signed)
Anesthesia Post Note  Patient: Wanda Castro  Procedure(s) Performed: LEFT BREAST LUMPECTOMY WITH RADIOACTIVE SEED AND SENTINEL LYMPH NODE BIOPSY (Left: Breast)     Patient location during evaluation: PACU Anesthesia Type: Regional and General Level of consciousness: awake and alert Pain management: pain level controlled Vital Signs Assessment: post-procedure vital signs reviewed and stable Respiratory status: spontaneous breathing, nonlabored ventilation, respiratory function stable and patient connected to nasal cannula oxygen Cardiovascular status: blood pressure returned to baseline and stable Postop Assessment: no apparent nausea or vomiting Anesthetic complications: no   No notable events documented.  Last Vitals:  Vitals:   09/05/21 1515 09/05/21 1545  BP: 131/74 (!) 144/73  Pulse: 89 78  Resp: 12 16  Temp:  36.5 C  SpO2: 93% 96%    Last Pain:  Vitals:   09/05/21 1545  TempSrc:   PainSc: 0-No pain                 Belenda Cruise P Rhaya Coale

## 2021-09-05 NOTE — Discharge Instructions (Signed)
°  Post Anesthesia Home Care Instructions  Activity: Get plenty of rest for the remainder of the day. A responsible individual must stay with you for 24 hours following the procedure.  For the next 24 hours, DO NOT: -Drive a car -Paediatric nurse -Drink alcoholic beverages -Take any medication unless instructed by your physician -Make any legal decisions or sign important papers.  Meals: Start with liquid foods such as gelatin or soup. Progress to regular foods as tolerated. Avoid greasy, spicy, heavy foods. If nausea and/or vomiting occur, drink only clear liquids until the nausea and/or vomiting subsides. Call your physician if vomiting continues.  Special Instructions/Symptoms: Your throat may feel dry or sore from the anesthesia or the breathing tube placed in your throat during surgery. If this causes discomfort, gargle with warm salt water. The discomfort should disappear within 24 hours.  If you had a scopolamine patch placed behind your ear for the management of post- operative nausea and/or vomiting:  1. The medication in the patch is effective for 72 hours, after which it should be removed.  Wrap patch in a tissue and discard in the trash. Wash hands thoroughly with soap and water. 2. You may remove the patch earlier than 72 hours if you experience unpleasant side effects which may include dry mouth, dizziness or visual disturbances. 3. Avoid touching the patch. Wash your hands with soap and water after contact with the patch.     No TYLENOL until 7 p.m.

## 2021-09-05 NOTE — Anesthesia Procedure Notes (Signed)
Procedure Name: LMA Insertion Date/Time: 09/05/2021 1:25 PM Performed by: Numair Masden, Ernesta Amble, CRNA Pre-anesthesia Checklist: Patient identified, Emergency Drugs available, Suction available and Patient being monitored Patient Re-evaluated:Patient Re-evaluated prior to induction Oxygen Delivery Method: Circle System Utilized Preoxygenation: Pre-oxygenation with 100% oxygen Induction Type: IV induction Ventilation: Mask ventilation without difficulty LMA: LMA inserted LMA Size: 4.0 Number of attempts: 1 Airway Equipment and Method: bite block Placement Confirmation: positive ETCO2 Tube secured with: Tape Dental Injury: Teeth and Oropharynx as per pre-operative assessment

## 2021-09-05 NOTE — Op Note (Signed)
09/05/2021  2:44 PM  PATIENT:  Wanda Castro  56 y.o. female  PRE-OPERATIVE DIAGNOSIS:  LEFT BREAST CANCER  POST-OPERATIVE DIAGNOSIS:  LEFT BREAST CANCER  PROCEDURE:  Procedure(s): LEFT BREAST LUMPECTOMY WITH RADIOACTIVE SEED LOCALIZATION AND DEEP LEFT AXILLARY SENTINEL LYMPH NODE BIOPSY (Left)  SURGEON:  Surgeon(s) and Role:    * Jovita Kussmaul, MD - Primary  PHYSICIAN ASSISTANT:   ASSISTANTS: none   ANESTHESIA:   local and general  EBL:  minimal   BLOOD ADMINISTERED:none  DRAINS: none   LOCAL MEDICATIONS USED:  MARCAINE     SPECIMEN:  Source of Specimen:  left breast tissue with additional lateral and superior/deep margin and sentinel node  DISPOSITION OF SPECIMEN:  PATHOLOGY  COUNTS:  YES  TOURNIQUET:  * No tourniquets in log *  DICTATION: .Dragon Dictation  After informed consent was obtained the patient was brought to the operating room and placed in the supine position on the operating table.  After adequate induction of general anesthesia the patient's left chest, breast, and axillary area were prepped with ChloraPrep, allowed to dry, and draped in usual sterile manner.  An appropriate timeout was performed.  At this point, 2 cc of iron oxide were injected into the subareolar plexus on the left and the breast was massaged for 5 minutes.  Previously an I-125 seed was placed in the upper outer quadrant of the left breast to mark an area of invasive breast cancer.  The mag trace was initially used to identify a signal in the left axilla.  The area overlying this was infiltrated with quarter percent Marcaine.  A small transversely oriented incision was made in the left axilla overlying the area of radioactivity with a 15 blade knife.  The incision was carried through the skin and subcutaneous tissue sharply with the electrocautery until the deep left axillary space was entered.  There was minimal signal in the left axilla.  I did identify a palpable lymph node that  appeared to have tracer in it.  This was excised sharply with the electrocautery and the surrounding small vessels and lymphatics were controlled with clips.  This was sent as sentinel node #1.  No other hot or palpable nodes were identified in the left axilla.  Hemostasis was achieved using the Bovie electrocautery.  The deep layer of the incision was closed with interrupted 3-0 Vicryl stitches.  The skin was then closed with a running 4-0 Monocryl subcuticular stitch.  Attention was then turned to the left breast.  The neoprobe was set to I-125 in the area of radioactivity was readily identified.  The area around this was infiltrated with quarter percent Marcaine.  A curvilinear incision was then made with a 15 blade knife along the upper outer edge of the areola of the left breast.  The incision was carried through the skin and subcutaneous tissue sharply with the electrocautery.  Dissection was then carried throughout the upper outer quadrant between the breast tissue and the subcutaneous fat and skin.  Once this dissection was beyond the area of the cancer I then removed a circular portion of breast tissue sharply with the electrocautery around the radioactive seed while checking the area of radioactivity frequently.  Once the specimen was removed it was oriented with the appropriate paint colors.  A specimen radiograph was obtained that showed the clip and seed to be near the center of the specimen.  I did elect to take an additional lateral and superior/deep margin.  These were marked  appropriately.  All of the tissue was then sent to pathology for further evaluation.  Hemostasis was achieved using the Bovie electrocautery.  The wound was then irrigated with copious amounts of saline and infiltrated with more quarter percent Marcaine.  The cavity was marked with clips.  The deep layer of the wound was then closed with layers of interrupted 3-0 Vicryl stitches.  The skin was then closed with interrupted 4-0  Monocryl subcuticular stitches.  Dermabond dressings were applied.  The patient tolerated the procedure well.  At the end of the case all needle sponge and instrument counts were correct.  The patient was then awakened and taken to recovery in stable condition.  PLAN OF CARE: Discharge to home after PACU  PATIENT DISPOSITION:  PACU - hemodynamically stable.   Delay start of Pharmacological VTE agent (>24hrs) due to surgical blood loss or risk of bleeding: not applicable

## 2021-09-05 NOTE — Progress Notes (Signed)
Assisted Dr. Jana Half with left, ultrasound guided, pectoralis block. Side rails up, monitors on throughout procedure. See vital signs in flow sheet. Tolerated Procedure well.

## 2021-09-05 NOTE — Transfer of Care (Signed)
Immediate Anesthesia Transfer of Care Note  Patient: Wanda Castro  Procedure(s) Performed: LEFT BREAST LUMPECTOMY WITH RADIOACTIVE SEED AND SENTINEL LYMPH NODE BIOPSY (Left: Breast)  Patient Location: PACU  Anesthesia Type:GA combined with regional for post-op pain  Level of Consciousness: drowsy and patient cooperative  Airway & Oxygen Therapy: Patient Spontanous Breathing and Patient connected to face mask oxygen  Post-op Assessment: Report given to RN and Post -op Vital signs reviewed and stable  Post vital signs: Reviewed and stable  Last Vitals:  Vitals Value Taken Time  BP 122/74 09/05/21 1453  Temp    Pulse 91 09/05/21 1454  Resp    SpO2 97 % 09/05/21 1454  Vitals shown include unvalidated device data.  Last Pain:  Vitals:   09/05/21 1251  TempSrc: Oral  PainSc: 0-No pain      Patients Stated Pain Goal: 2 (16/10/96 0454)  Complications: No notable events documented.

## 2021-09-05 NOTE — Anesthesia Preprocedure Evaluation (Signed)
Anesthesia Evaluation    Airway Mallampati: II  TM Distance: >3 FB Neck ROM: Full    Dental no notable dental hx.    Pulmonary neg pulmonary ROS,    Pulmonary exam normal        Cardiovascular hypertension, Pt. on medications  Rhythm:Regular Rate:Normal     Neuro/Psych  Headaches, Anxiety    GI/Hepatic Neg liver ROS, GERD  ,  Endo/Other  negative endocrine ROS  Renal/GU negative Renal ROS  negative genitourinary   Musculoskeletal Left breast Ca   Abdominal Normal abdominal exam  (+)   Peds  Hematology negative hematology ROS (+)   Anesthesia Other Findings   Reproductive/Obstetrics                             Anesthesia Physical Anesthesia Plan  ASA: 2  Anesthesia Plan: General and Regional   Post-op Pain Management: Regional block*   Induction: Intravenous  PONV Risk Score and Plan: 3 and Ondansetron, Dexamethasone, Midazolam and Treatment may vary due to age or medical condition  Airway Management Planned: Mask and LMA  Additional Equipment: None  Intra-op Plan:   Post-operative Plan: Extubation in OR  Informed Consent: I have reviewed the patients History and Physical, chart, labs and discussed the procedure including the risks, benefits and alternatives for the proposed anesthesia with the patient or authorized representative who has indicated his/her understanding and acceptance.     Dental advisory given  Plan Discussed with: CRNA  Anesthesia Plan Comments:         Anesthesia Quick Evaluation

## 2021-09-05 NOTE — Anesthesia Procedure Notes (Addendum)
Anesthesia Regional Block: Pectoralis block   Pre-Anesthetic Checklist: , timeout performed,  Correct Patient, Correct Site, Correct Laterality,  Correct Procedure, Correct Position, site marked,  Risks and benefits discussed,  Surgical consent,  Pre-op evaluation,  At surgeon's request and post-op pain management  Laterality: Left  Prep: Dura Prep       Needles:  Injection technique: Single-shot  Needle Type: Echogenic Stimulator Needle     Needle Length: 5cm  Needle Gauge: 20     Additional Needles:   Procedures:,,,, ultrasound used (permanent image in chart),,    Narrative:  Start time: 09/05/2021 1:05 PM End time: 09/05/2021 1:10 PM Injection made incrementally with aspirations every 5 mL.  Performed by: Personally  Anesthesiologist: Darral Dash, DO  Additional Notes: Patient identified. Risks/Benefits/Options discussed with patient including but not limited to bleeding, infection, nerve damage, failed block, incomplete pain control. Patient expressed understanding and wished to proceed. All questions were answered. Sterile technique was used throughout the entire procedure. Please see nursing notes for vital signs. Aspirated in 5cc intervals with injection for negative confirmation. Patient was given instructions on fall risk and not to get out of bed. All questions and concerns addressed with instructions to call with any issues or inadequate analgesia.

## 2021-09-05 NOTE — Interval H&P Note (Signed)
History and Physical Interval Note:  09/05/2021 12:57 PM  Wanda Castro  has presented today for surgery, with the diagnosis of LEFT BREAST CANCER.  The various methods of treatment have been discussed with the patient and family. After consideration of risks, benefits and other options for treatment, the patient has consented to  Procedure(s): LEFT BREAST LUMPECTOMY WITH RADIOACTIVE SEED AND SENTINEL LYMPH NODE BIOPSY (Left) as a surgical intervention.  The patient's history has been reviewed, patient examined, no change in status, stable for surgery.  I have reviewed the patient's chart and labs.  Questions were answered to the patient's satisfaction.     Autumn Messing III

## 2021-09-05 NOTE — H&P (Signed)
REFERRING PHYSICIAN: McComb, Gwynn Burly, MD  PROVIDER: Landry Corporal, MD  MRN: [id number] DOB: [birth year] Subjective   Chief Complaint: Breast Cancer   History of Present Illness: Wanda Castro is a 56 y.o. female who is seen today as an office consultation at the request of Dr. Radene Knee for evaluation of Breast Cancer .   We are asked to see the patient in consultation by Dr. Arvella Nigh to evaluate her for a new left breast cancer. The patient is a 56 year old white female who recently went for a routine screening mammogram. At that time she was found to have a 3 mm mass in the upper outer quadrant of the left breast. The lymph nodes looked normal. 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 The HER2 is pending. She is otherwise in good health and does not smoke. She has no family history of breast cancer.  Review of Systems: A complete review of systems was obtained from the patient. I have reviewed this information and discussed as appropriate with the patient. See HPI as well for other ROS.  ROS   Medical History: Past Medical History:  Diagnosis Date   Anxiety   Hypertension   Patient Active Problem List  Diagnosis   Malignant neoplasm of upper-outer quadrant of left breast in female, estrogen receptor positive (CMS-HCC)   Past Surgical History:  Procedure Laterality Date   basel cell excision    No Known Allergies  Current Outpatient Medications on File Prior to Visit  Medication Sig Dispense Refill   calcium carbonate/vitamin D3 (CALCIUM WITH VITAMIN D ORAL) Calcium with Vitamin D   IMVEXXY MAINTENANCE PACK 4 mcg Inst INSERT 1 VAGINAL INSERT TWICE A WEEK BY VAGINAL ROUTE.   losartan-hydrochlorothiazide (HYZAAR) 100-12.5 mg tablet   No current facility-administered medications on file prior to visit.   Family History  Problem Relation Age of Onset   High blood pressure (Hypertension)  Mother   High blood pressure (Hypertension) Father   High blood pressure (Hypertension) Sister   Diabetes Sister   High blood pressure (Hypertension) Brother    Social History   Tobacco Use  Smoking Status Never  Smokeless Tobacco Never    Social History   Socioeconomic History   Marital status: Single  Tobacco Use   Smoking status: Never   Smokeless tobacco: Never   Objective:   Vitals:  BP: (!) 148/90  Pulse: 103  Weight: 74.4 kg (164 lb)  Height: 160 cm (5' 3" )   Body mass index is 29.05 kg/m.  Physical Exam Vitals reviewed.  Constitutional:  General: She is not in acute distress. Appearance: Normal appearance.  HENT:  Head: Normocephalic and atraumatic.  Right Ear: External ear normal.  Left Ear: External ear normal.  Nose: Nose normal.  Mouth/Throat:  Mouth: Mucous membranes are moist.  Pharynx: Oropharynx is clear.  Eyes:  General: No scleral icterus. Extraocular Movements: Extraocular movements intact.  Conjunctiva/sclera: Conjunctivae normal.  Pupils: Pupils are equal, round, and reactive to light.  Cardiovascular:  Rate and Rhythm: Normal rate and regular rhythm.  Pulses: Normal pulses.  Heart sounds: Normal heart sounds.  Pulmonary:  Effort: Pulmonary effort is normal. No respiratory distress.  Breath sounds: Normal breath sounds.  Abdominal:  General: Bowel sounds are normal.  Palpations: Abdomen is soft.  Tenderness: There is no abdominal tenderness.  Musculoskeletal:  General: No swelling, tenderness or deformity. Normal range of motion.  Cervical back: Normal range of motion and neck supple.  Skin: General: Skin is warm and dry.  Coloration: Skin is not jaundiced.  Neurological:  General: No focal deficit present.  Mental Status: She is alert and oriented to person, place, and time.  Psychiatric:  Mood and Affect: Mood normal.  Behavior: Behavior normal.     Breast: There is no palpable mass in either breast. There is no  palpable axillary, supraclavicular, or cervical lymphadenopathy.  Labs, Imaging and Diagnostic Testing:  Assessment and Plan:   Diagnoses and all orders for this visit:  Malignant neoplasm of upper-outer quadrant of left breast in female, estrogen receptor positive (CMS-HCC) - Ambulatory Referral to Oncology-Medical - Ambulatory Referral to Physical Therapy - Ambulatory Referral to Radiation Oncology - [facility] Case Posting Request; Future    The patient appears to have a small low-grade stage I 3 mm cancer in the upper outer quadrant of the left breast with clinically negative nodes. I have discussed with her in detail the different options for treatment. At this point she is undecided between lumpectomy and mastectomy. I will refer her to medical and radiation oncology to discuss adjuvant therapy. I will also refer her to physical therapy for preoperative lymphedema testing. We will call her once we have the HER2 status. She will call us once she has made her final decision and we can proceed with surgical scheduling. I have discussed with her in detail the risks and benefits of the operation as well as some of the technical aspects and she understands.

## 2021-09-08 ENCOUNTER — Telehealth: Payer: Self-pay

## 2021-09-08 ENCOUNTER — Encounter (HOSPITAL_BASED_OUTPATIENT_CLINIC_OR_DEPARTMENT_OTHER): Payer: Self-pay | Admitting: General Surgery

## 2021-09-08 ENCOUNTER — Telehealth: Payer: Self-pay | Admitting: Genetic Counselor

## 2021-09-08 ENCOUNTER — Encounter: Payer: Self-pay | Admitting: Genetic Counselor

## 2021-09-08 ENCOUNTER — Ambulatory Visit: Payer: Self-pay | Admitting: Genetic Counselor

## 2021-09-08 DIAGNOSIS — Z1501 Genetic susceptibility to malignant neoplasm of breast: Secondary | ICD-10-CM

## 2021-09-08 DIAGNOSIS — Z1509 Genetic susceptibility to other malignant neoplasm: Secondary | ICD-10-CM

## 2021-09-08 DIAGNOSIS — Z1379 Encounter for other screening for genetic and chromosomal anomalies: Secondary | ICD-10-CM

## 2021-09-08 NOTE — Telephone Encounter (Signed)
I contacted Wanda Castro to discuss her genetic testing results. 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 Of note, a variant of uncertain significance was identified in the RAD51C gene. Detailed clinic note to follow.  The test report has been scanned into EPIC and is located under the Molecular Pathology section of the Results Review tab.  A portion of the result report is included below for reference.   Wanda Passy, MS, [facility] Genetic Counselor [email or web address] (P) [phone]

## 2021-09-08 NOTE — Progress Notes (Signed)
HPI:   Wanda Castro was previously seen in the [place] clinic due to a personal and family history of cancer and concerns regarding a hereditary predisposition to cancer. Please refer to our prior cancer genetics clinic note for more information regarding our discussion, assessment and recommendations, at the time. Wanda Castro's recent genetic test results were disclosed to her, as were recommendations warranted by these results. These results and recommendations are discussed in more detail below.  CANCER HISTORY:  Oncology History  Malignant neoplasm of upper-outer quadrant of left breast in female, estrogen receptor positive ([place])  07/31/2021 Imaging   CLINICAL DATA:  Screening recall for a left breast asymmetry.   EXAM: DIGITAL DIAGNOSTIC UNILATERAL LEFT MAMMOGRAM WITH TOMOSYNTHESIS AND CAD; ULTRASOUND LEFT BREAST LIMITED  IMPRESSION: 1. There is an indeterminate 3 mm mass in the left breast at 2 o'clock.   2.  No evidence of left axillary lymphadenopathy.   08/04/2021 Initial Biopsy   Diagnosis Breast, left, needle core biopsy, UOQ, 2 o'clock, 3cmfn, ribbon clip - INVASIVE DUCTAL CARCINOMA - DUCTAL CARCINOMA IN SITU - SEE COMMENT  Microscopic Comment Based on the biopsy, the carcinoma appears Nottingham grade 1 of 3 and measures 0.5 cm in greatest linear extent.  PROGNOSTIC INDICATORS Results: The tumor cells are EQUIVOCAL for Her2 (2+). Her2 by FISH will be performed and results reported separately. 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 Report date is 08/27/2021.  [employer] CustomNext-Cancer+RNAinsight panel offered by [employer] includes sequencing and rearrangement analysis for the following 52 genes:  APC, ATM, AXIN2, BAP1, BARD1, BMPR1A, BRCA1, BRCA2, BRIP1, CDH1, CDK4, CDKN2A, CHEK2, CTNNA1, DICER1, EPCAM, FH, FLCN, GREM1, HOXB13, KIT, MEN1, MET, MITF, MLH1, MSH2, MSH3, MSH6, MUTYH, NBN, NF1, NTHL1, PALB2, PDGFRA, PMS2, POLD1, POLE, PTEN, RAD50, RAD51C, RAD51D, SDHA, SDHB, SDHC, SDHD, SMAD4, SMARCA4, STK11, TP53, TSC1, TSC2, and VHL.  RNA data is routinely analyzed for use in variant interpretation for all genes.      FAMILY HISTORY:  We obtained a detailed, 4-generation family history.  Significant diagnoses are listed below:      Family History  Problem Relation Age of Onset   Atrial fibrillation Mother     COPD Mother     Hypertension Father     Renal cancer Father 59   Cancer Maternal Uncle 54        colon cancer   Cancer Paternal Aunt          small cell lung cancer   Prostate cancer Paternal Uncle     Prostate cancer Paternal Uncle     Ovarian cancer Cousin 86        maternal first cousin   Lung cancer Cousin          paternal first cousin   Lung cancer Cousin          paternal first cousin   Uterine cancer Cousin          paternal first cousin, dx. 24s   Ovarian cancer Cousin          dx. 30s   Colon cancer Cousin  paternal first cousin, dx. 42s       Wanda Castro's maternal uncle was diagnosed with colon cancer at age 32, he is deceased. She has a maternal cousin who was diagnosed with ovarian cancer at age 33, she died at age 45. Her father was diagnosed with renal cancer at age 12, he had a nephrectomy and died due to metastatic renal cancer at age 68. Her paternal aunt was diagnosed with lung cancer, she died due to metastatic lung cancer. She has two paternal uncles diagnosed with prostate cancer, one is deceased. She has two paternal cousins diagnosed with lung cancer who are  deceased and a paternal cousin diagnosed with colon cancer in his 62s.    Wanda Castro is unaware of previous family history of genetic testing for hereditary cancer risks. There is no reported Ashkenazi Jewish ancestry.   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 Specifically, this variant is  [id number].  Genetic testing also identified a variant of uncertain significance (VUS) in the RAD51C gene called  p.I52L.  At this time, it is unknown if this variant is associated with an increased risk for cancer or if it is benign, but most uncertain variants are reclassified to benign. It should not be used to make medical management decisions. With time, we suspect the laboratory will determine the significance of this variant, if any. If the laboratory reclassifies this variant, we will attempt to contact Wanda Castro to discuss it further.   The test report has been scanned into EPIC and is located under the Molecular Pathology section of the Results Review tab.  A portion of the result report is included below for reference. Genetic testing reported out on 08/27/2021.         The cancers associated with CDKN2A include: Melanoma, 28-67% Pancreatic cancer, 17% risk     Management Recommendations:   Skin Cancer Screening and Risk Reduction: Regular skin self-examinations Individuals should notify their physicians of any changes to moles such as increasing in size, darkening in color, or other change in appearance. Skin examinations by a dermatologist every 6 months Follow sun-safety recommendations such as: Using UVA and UVB 30 SPF or higher sunscreen Avoiding sunburns Wearing protective clothing and sunglasses Avoid using tanning beds For more information about the prevention of melanoma visit [email or web address]   Pancreatic Cancer Screening/Risk Reduction: Avoid smoking, heavy alcohol use, and  obesity. Consider pancreatic cancer screening beginning at age 54 years (or 65 years younger than the earliest exocrine pancreatic cancer diagnosis in the family, whichever is earlier). Recommended screening could include annual endoscopic ultrasound and/or MRI of the pancreas.  We have placed a referral for Dr. Bryan Lemma at [facility].    This information is based on current understanding of the gene and may change in the future.   Implications for Family Members: Hereditary predisposition to cancer due to pathogenic variants in the CDKN2A gene has autosomal dominant inheritance. This means that an individual with a pathogenic variant has a 50% chance of passing the condition on to his/her offspring. Identification of a pathogenic variant allows for the recognition of at-risk relatives who can pursue testing for the familial variant.   Family members are encouraged to consider genetic testing for this familial pathogenic variant. They may contact our office at [phone] for more information or to schedule an appointment. Complimentary testing for the familial variant is available for 90 days from her report date.  Family members who live outside of the area are encouraged to find a genetic counselor in their area by visiting: [email or web address].   Resources: FORCE (Facing Our Risk of Cancer Empowered) is a resource for those with a hereditary predisposition to develop cancer. FORCE provides information about risk reduction, advocacy, legislation, and clinical trials.  Additionally, FORCE provides a platform for collaboration and support; which includes: peer navigation, message boards, local support groups, a toll-free helpline, research registry and recruitment, advocate training, published medical research, webinars, brochures, mastectomy photos, and more.  For more information, visit www.facingourrisk.org  Our contact number was provided. Wanda Castro's  questions were answered to her satisfaction, and she knows she is welcome to call us at anytime with additional questions or concerns.   Lucille Passy, MS, [facility] Genetic Counselor [email or web address] (P) [phone]

## 2021-09-08 NOTE — Telephone Encounter (Signed)
-----   Message from Elliott, DO sent at 09/08/2021  3:54 PM EST ----- Regarding: RE: Pancreatic Cancer Screening (CDKN2A+) Mel Almond,  No problem, we would be happy to see her in the office when she is ready. Sounds reasonable to wait until after Breast Cancer treatment, then we can discuss possible Pancreatic CA screening.   - Illene Bolus, Please see the below message and assist in coordinating OV with me later in the year when it worksk better for the patient.   Thanks!   ----- Message ----- From: Katheren Shams, Counselor Sent: 09/08/2021   3:51 PM EST To: Lavena Bullion, DO Subject: Pancreatic Cancer Screening (CDKN2A+)          Dr. Bryan Lemma,   I would like to refer Ms. Berrios to you for pancreatic cancer screening. She had genetic testing due to a personal history of breast cancer and family history of cancer. 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 She does not have any known family history of pancreatic cancer.   She requested a referral but will likely want to schedule an appointment for later in the year once she has completed treatment for her breast cancer.   Thanks!  Mel Almond

## 2021-09-08 NOTE — Telephone Encounter (Signed)
I attempted to contact Ms. Graumann to discuss her genetic testing results. However, she is busy at this time and requested I call her back tomorrow.   Lucille Passy, MS, [facility] Genetic Counselor [email or web address] (P) [phone]

## 2021-09-08 NOTE — Telephone Encounter (Signed)
Spoke with pt. Pt requested that office visit be in August or September. 6 month recall placed in epic.

## 2021-09-09 ENCOUNTER — Encounter: Payer: Self-pay | Admitting: Genetic Counselor

## 2021-09-10 LAB — SURGICAL PATHOLOGY

## 2021-09-11 ENCOUNTER — Encounter: Payer: Self-pay | Admitting: *Deleted

## 2021-09-11 DIAGNOSIS — Z170 Estrogen receptor positive status [ER+]: Secondary | ICD-10-CM

## 2021-09-11 DIAGNOSIS — C50412 Malignant neoplasm of upper-outer quadrant of left female breast: Secondary | ICD-10-CM

## 2021-09-17 ENCOUNTER — Encounter: Payer: Self-pay | Admitting: *Deleted

## 2021-09-24 ENCOUNTER — Ambulatory Visit: Payer: BC Managed Care – PPO | Attending: General Surgery | Admitting: Rehabilitation

## 2021-09-24 ENCOUNTER — Encounter: Payer: Self-pay | Admitting: Rehabilitation

## 2021-09-24 ENCOUNTER — Other Ambulatory Visit: Payer: Self-pay

## 2021-09-24 DIAGNOSIS — R293 Abnormal posture: Secondary | ICD-10-CM | POA: Insufficient documentation

## 2021-09-24 DIAGNOSIS — C50412 Malignant neoplasm of upper-outer quadrant of left female breast: Secondary | ICD-10-CM | POA: Insufficient documentation

## 2021-09-24 DIAGNOSIS — Z483 Aftercare following surgery for neoplasm: Secondary | ICD-10-CM | POA: Insufficient documentation

## 2021-09-24 DIAGNOSIS — Z170 Estrogen receptor positive status [ER+]: Secondary | ICD-10-CM | POA: Insufficient documentation

## 2021-09-24 NOTE — Therapy (Signed)
?OUTPATIENT PHYSICAL THERAPY BREAST CANCER POST OP FOLLOW UP ? ? ?Patient Name: Wanda Castro ?MRN: [id number] ?DOB:Apr 23, 1966, 56 y.o., female ?Today's Date: 09/24/2021 ? ? PT End of Session - 09/24/21 1522   ? ? Visit Number 2   ? Number of Visits 2   ? Date for PT Re-Evaluation 09/30/21   ? Authorization Type BCBS   ? PT Start Time 1500   ? PT Stop Time 1522   ? PT Time Calculation (min) 22 min   ? Activity Tolerance Patient tolerated treatment well   ? Behavior During Therapy [facility] for tasks assessed/performed   ? ?  ?  ? ?  ? ? ?Past Medical History:  ?Diagnosis Date  ? Anxiety   ? GERD (gastroesophageal reflux disease)   ? Heart murmur   ? HTN (hypertension)   ? Hypertension   ? Migraine   ? Right ureteral stone   ? Wears contact lenses   ? ?Past Surgical History:  ?Procedure Laterality Date  ? BREAST BIOPSY Left 08/04/2021  ? BREAST LUMPECTOMY WITH RADIOACTIVE SEED AND SENTINEL LYMPH NODE BIOPSY Left 09/05/2021  ? Procedure: LEFT BREAST LUMPECTOMY WITH RADIOACTIVE SEED AND SENTINEL LYMPH NODE BIOPSY;  Surgeon: Jovita Kussmaul, MD;  Location: [place];  Service: General;  Laterality: Left;  ? COLONOSCOPY  2015 approx  ? ?Patient Active Problem List  ? Diagnosis Date Noted  ? Monoallelic mutation of QMVH8I gene 09/08/2021  ? Genetic testing 08/25/2021  ? Family history of colon cancer 08/13/2021  ? Family history of ovarian cancer 08/13/2021  ? Malignant neoplasm of upper-outer quadrant of left breast in female, estrogen receptor positive ([place]) 08/11/2021  ? ? ?PCP: [name], MD ? ?REFERRING PROVIDER: Jovita Kussmaul, MD ? ?REFERRING DIAG: Left breast cancer ? ?THERAPY DIAG:  ?Malignant neoplasm of upper-outer quadrant of left breast in female, estrogen receptor positive ([place]) ? ?Abnormal posture ? ?Aftercare following surgery for neoplasm ? ?ONSET DATE: 09/05/21 ? ?SUBJECTIVE:                                                                                                                                                                                           ? ?SUBJECTIVE STATEMENT: ?I had my follow up with Dr. Marlou Starks.  He said it looked great.  I have been wearing the compression bra daily.   ? ?PERTINENT HISTORY:  ?Left lumpectomy and SLNB 09/05/21 with 0/3 LN positive. GERD, HTN, migraines ? ?PATIENT GOALS:  Reassess how my recovery is going related to arm function, pain, and swelling. ? ?PAIN:  ?Are you having pain? It is just sore every  once in a while  ? ?PRECAUTIONS: Recent Surgery, left UE Lymphedema risk, Other: lymphedema risk Lt UE ? ?ACTIVITY LEVEL / LEISURE: Nothing new ? ? ?OBJECTIVE:  ? ?PATIENT SURVEYS:  ?QUICK DASH: ?13.6% ? ?OBSERVATIONS: ? Well healed breast and axillary incision. Some purple tint to the nipple from dye.  Slight puffiness lateral breast over bra.  Education on using pillow in axilla and made 1/2" foam pad for use in bra.  ? ?POSTURE:  ?WNL ? ?LYMPHEDEMA ASSESSMENT:  ? ?UPPER EXTREMITY AROM/PROM: ?  ?A/PROM Right ?08/19/2021 Left ?08/19/2021 Left 09/24/21  ?Shoulder extension   65 65  ?Shoulder flexion   150 156  ?Shoulder abduction   160 165  ?Shoulder internal rotation   75   ?Shoulder external rotation   95 95  ?                        (Blank rows = not tested) ?  ?  ?  ?CERVICAL AROM: ?All within normal limits:  ?  ?  ?LYMPHEDEMA ASSESSMENTS:  ?  ?LANDMARK RIGHT ?08/19/2021 LEFT ?08/19/2021 Left ?09/24/21  ?10 cm proximal to olecranon process   30.5 30.5  ?Olecranon process   24.5 24.5  ?10 cm proximal to ulnar styloid process   20.6 20.6  ?Just proximal to ulnar styloid process   15.5 15.5  ?Across hand at thumb web space   18.5 18.5  ?At base of 2nd digit   6.3 6.3  ?(Blank rows = not tested) ? ? ?  ?PATIENT EDUCATION:  ?Education details: post op Community education officer ?Person educated: Patient ?Education method: Explanation ?Education comprehension: verbalized understanding ? ? ?HOME EXERCISE PROGRAM: ? Reviewed previously given post op HEP. - updated to supine flexion and  chest stretch ? ? ?ASSESSMENT: ? ?CLINICAL IMPRESSION: ?Pt presents almost 3 weeks post Lt lumpectomy and SLNB,  No evident of cording, has returned to prior AROM, and is ready for radiation position.  Education per below.  Pt is signed up for ABC class and follow up SOZO ? ?Pt will benefit from skilled therapeutic intervention to improve on the following deficits: Decreased knowledge of precautions, impaired UE functional use, pain, decreased ROM, postural dysfunction.  ? ?PT treatment/interventions: ADL/Self care home management, Therapeutic exercises, Patient/Family education, DME instructions, and Manual therapy ? ? ? ? ?GOALS: ?Goals reviewed with patient? Yes ? ?LONG TERM GOALS:  (STG=LTG) ? ?GOALS Name Target Date Goal status  ?1 Pt will demonstrate she has regained full shoulder ROM and function post operatively compared to baselines.  ?Baseline: 09/24/21 MET  ?     ?     ?     ? ? ? ?PLAN: ?PT FREQUENCY/DURATION: SOZO visits every 3 months ? ?PLAN FOR NEXT SESSION: 3 month SOZO ? ? ?[facility] ? [address], [address] ? [place] [address] ? [phone] ? ?After Breast Cancer Class ?It is recommended you attend the ABC class to be educated on lymphedema risk reduction. This class is free of charge and lasts for 1 hour. It is a 1-time class. You will need to download the Webex app either on your phone or computer. We will send you a link the night before or the morning of the class. You should be able to click on that link to join the class. This is not a confidential class. You don't have to turn your camera on, but other participants may be able to see your email address. ? ?  Scar massage ?You can begin gentle scar massage to you incision sites. Gently place one hand on the incision and move the skin (without sliding on the skin) in various directions. Do this for a few minutes and then you can gently massage either coconut oil or vitamin E cream into the scars. ? ?Compression  garment ?You should continue wearing your compression bra until you feel like you no longer have swelling. ? ?Home exercise Program ?Continue doing the exercises you were given until you feel like you can do them without feeling any tightness at the end.  ? ?Walking Program ?Studies show that 30 minutes of walking per day (fast enough to elevate your heart rate) can significantly reduce the risk of a cancer recurrence. If you can't walk due to other medical reasons, we encourage you to find another activity you could do (like a stationary bike or water exercise). ? ?Posture ?After breast cancer surgery, people frequently sit with rounded shoulders posture because it puts their incisions on slack and feels better. If you sit like this and scar tissue forms in that position, you can become very tight and have pain sitting or standing with good posture. Try to be aware of your posture and sit and stand up tall to heal properly. ? ?Follow up PT: ?It is recommended you return every 3 months for the first 3 years following surgery to be assessed on the SOZO machine for an L-Dex score. This helps prevent clinically significant lymphedema in 95% of patients. These follow up screens are 10 minute appointments that you are not billed for. ? ?Stark Bray, PT ?09/24/2021, 3:23 PM ? ?

## 2021-09-24 NOTE — Patient Instructions (Addendum)
[facility] ? [address], [address] ? [place] [address] ? [phone] ? ?After Breast Cancer Class ?It is recommended you attend the ABC class to be educated on lymphedema risk reduction. This class is free of charge and lasts for 1 hour. It is a 1-time class. You will need to download the Webex app either on your phone or computer. We will send you a link the night before or the morning of the class. You should be able to click on that link to join the class. This is not a confidential class. You don't have to turn your camera on, but other participants may be able to see your email address. ? ?Scar massage (4-6 weeks)  ?You can begin gentle scar massage to you incision sites. Gently place one hand on the incision and move the skin (without sliding on the skin) in various directions. Do this for a few minutes and then you can gently massage either coconut oil or vitamin E cream into the scars. ? ?Compression garment ?You should continue wearing your compression bra until you feel like you no longer have swelling. ? ?Home exercise Program ?Continue doing the exercises you were given until you feel like you can do them without feeling any tightness at the end.  ? ?Walking Program ?Studies show that 30 minutes of walking per day (fast enough to elevate your heart rate) can significantly reduce the risk of a cancer recurrence. If you can't walk due to other medical reasons, we encourage you to find another activity you could do (like a stationary bike or water exercise). ? ?Posture ?After breast cancer surgery, people frequently sit with rounded shoulders posture because it puts their incisions on slack and feels better. If you sit like this and scar tissue forms in that position, you can become very tight and have pain sitting or standing with good posture. Try to be aware of your posture and sit and stand up tall to heal properly. ? ?Follow up PT: ?It is recommended you return every 3  months for the first 3 years following surgery to be assessed on the SOZO machine for an L-Dex score. This helps prevent clinically significant lymphedema in 95% of patients. These follow up screens are 10 minute appointments that you are not billed for.    12/15/21 at 4pm ? ?

## 2021-09-25 ENCOUNTER — Encounter (HOSPITAL_COMMUNITY): Payer: Self-pay

## 2021-09-29 NOTE — Progress Notes (Addendum)
?Radiation Oncology         [phone] ?________________________________ ? ?Initial Outpatient Consultation - Conducted via telephone due to current COVID-19 concerns for limiting patient exposure ? ?I spoke with the patient to conduct this consult visit via telephone to spare the patient unnecessary potential exposure in the healthcare setting during the current COVID-19 pandemic. The patient was notified in advance and was offered a [facility] meeting to allow for face to face communication but unfortunately reported that they did not have the appropriate resources/technology to support such a visit and instead preferred to proceed with a telephone consult.  ? ?________________________________ ? ?Name: Wanda Castro        MRN: [id number]  ?Date of Service: 10/02/2021 DOB: 1966/05/06 ? ?PR:XYVOPFYT, Ermalene Searing, MD  Truitt Merle, MD    ? ?REFERRING PHYSICIAN: Truitt Merle, MD ? ? ?DIAGNOSIS: The encounter diagnosis was Malignant neoplasm of upper-outer quadrant of left breast in female, estrogen receptor positive ([place]). ? ? ?HISTORY OF PRESENT ILLNESS: Wanda Castro is a 56 y.o. female with a diagnosis of left breast cancer. The patient was recently found on screening mammogram in December 2022 to have a focal asymmetry in the left breast.  Further diagnostic work-up showed a hypoechoic oval mass with indistinct margins measuring up to 3 mm in the 2 o'clock position of the left breast.  Her axilla showed multiple normal-appearing lymph nodes.  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 Her Ki 67 was 5%. ? ?Since her last visit, she underwent left lumpectomy with sentinel lymph node biopsy on 09/05/2021.  This revealed a 3 mm area of grade 1 invasive ductal carcinoma with associated low-grade DCIS and calcifications.  Her margins were negative, and 4 sentinel lymph nodes were sampled and all were negative for disease. No chemotherapy is  planned and Oncotype Dx was not ordered due to the grade and small size. She is seen today to discuss adjuvant radiotherapy. ? ?PREVIOUS RADIATION THERAPY: No ? ? ?PAST MEDICAL HISTORY:  ?Past Medical History:  ?Diagnosis Date  ? Anxiety   ? GERD (gastroesophageal reflux disease)   ? Heart murmur   ? HTN (hypertension)   ? Hypertension   ? Migraine   ? Right ureteral stone   ? Wears contact lenses   ?   ? ? ?PAST SURGICAL HISTORY: ?Past Surgical History:  ?Procedure Laterality Date  ? BREAST BIOPSY Left 08/04/2021  ? BREAST LUMPECTOMY WITH RADIOACTIVE SEED AND SENTINEL LYMPH NODE BIOPSY Left 09/05/2021  ? Procedure: LEFT BREAST LUMPECTOMY WITH RADIOACTIVE SEED AND SENTINEL LYMPH NODE BIOPSY;  Surgeon: Jovita Kussmaul, MD;  Location: [facility];  Service: General;  Laterality: Left;  ? COLONOSCOPY  2015 approx  ? ? ? ?FAMILY HISTORY:  ?Family History  ?Problem Relation Age of Onset  ? Atrial fibrillation Mother   ? COPD Mother   ? Hypertension Father   ? Renal cancer Father 96  ? Cancer Maternal Uncle 2  ?     colon cancer  ? Cancer Paternal Aunt   ?     small cell lung cancer  ? Prostate cancer Paternal Uncle   ? Prostate cancer Paternal Uncle   ? Ovarian cancer Cousin 92  ?     maternal first cousin  ? Lung cancer Cousin   ?     paternal first cousin  ? Lung cancer Cousin   ?  paternal first cousin  ? Uterine cancer Cousin   ?     paternal first cousin, dx. 28s  ? Ovarian cancer Cousin   ?     dx. 74s  ? Colon cancer Cousin   ?     paternal first cousin, dx. 54s  ? ? ? ?SOCIAL HISTORY:  reports that she has never smoked. She has never used smokeless tobacco. She reports that she does not drink alcohol and does not use drugs. She is single and lives in [place]. She works in an office as an Web designer.  ? ? ?ALLERGIES: Adhesive [tape] ? ? ?MEDICATIONS:  ?Current Outpatient Medications  ?Medication Sig Dispense Refill  ? ALPRAZolam (XANAX) 0.25 MG tablet Take 0.25 mg by mouth at  bedtime as needed for anxiety.    ? Calcium Carb-Cholecalciferol (CALCIUM 600 + D PO) Take 1 tablet by mouth daily.    ? cyclobenzaprine (FLEXERIL) 10 MG tablet Take 10 mg by mouth 3 (three) times daily as needed for muscle spasms.    ? losartan-hydrochlorothiazide (HYZAAR) 100-12.5 MG tablet Take 0.5 tablets by mouth every morning.     ? mometasone (ELOCON) 0.1 % cream mometasone 0.1 % topical cream ? APPLY TO EXTERNAL EAR ONCE DAILY AS NEEDED ITCHING    ? ondansetron (ZOFRAN) 4 MG tablet Take 1 tablet (4 mg total) by mouth every 6 (six) hours as needed for nausea or vomiting. 12 tablet 0  ? oxyCODONE (ROXICODONE) 5 MG immediate release tablet Take 1 tablet (5 mg total) by mouth every 6 (six) hours as needed for severe pain. 10 tablet 0  ? oxyCODONE-acetaminophen (PERCOCET) 5-325 MG tablet Take 1 tablet by mouth every 4 (four) hours as needed for moderate pain. 20 tablet 0  ? tretinoin (RETIN-A) 0.05 % cream Apply 1 application topically daily.    ? ?No current facility-administered medications for this visit.  ? ? ? ?REVIEW OF SYSTEMS: On review of systems, the patient reports that she is doing well overall. She is having some numbness in her left posterior shoulder region. No other complaints of healing is noted. ? ?  ? ?PHYSICAL EXAM:  ?Unable to assess due to encounter type. ? ? ?ECOG = 0 ? ?0 - Asymptomatic (Fully active, able to carry on all predisease activities without restriction) ? ?1 - Symptomatic but completely ambulatory (Restricted in physically strenuous activity but ambulatory and able to carry out work of a light or sedentary nature. For example, light housework, office work) ? ?2 - Symptomatic, <50% in bed during the day (Ambulatory and capable of all self care but unable to carry out any work activities. Up and about more than 50% of waking hours) ? ?3 - Symptomatic, >50% in bed, but not bedbound (Capable of only limited self-care, confined to bed or chair 50% or more of waking hours) ? ?4 -  Bedbound (Completely disabled. Cannot carry on any self-care. Totally confined to bed or chair) ? ?5 - Death ? ? Oken MM, Creech RH, Tormey DC, et al. [phone]). "Toxicity and response criteria of the [facility]". [place] Oncol. 5 (6): 649-55 ? ? ? ?LABORATORY DATA:  ?Lab Results  ?Component Value Date  ? WBC 11.2 (H) 11/28/2015  ? HGB 12.4 11/28/2015  ? HCT 37.2 11/28/2015  ? MCV 89.6 11/28/2015  ? PLT 255 11/28/2015  ? ?Lab Results  ?Component Value Date  ? NA 139 09/01/2021  ? K 3.8 09/01/2021  ? CL 102 09/01/2021  ?  CO2 27 09/01/2021  ? ?Lab Results  ?Component Value Date  ? ALT 17 11/28/2015  ? AST 22 11/28/2015  ? ALKPHOS 70 11/28/2015  ? BILITOT 0.7 11/28/2015  ? ?  ? ?RADIOGRAPHY: MM Breast Surgical Specimen ? ?Result Date: 09/05/2021 ?CLINICAL DATA:  Status post lumpectomy today after earlier radioactive seed localization. EXAM: SPECIMEN RADIOGRAPH OF THE LEFT BREAST COMPARISON:  Previous exam(s). FINDINGS: Status post excision of the left breast. The radioactive seed and biopsy marker clip are present and completely intact within the specimen. Findings discussed with the OR staff during the procedure. IMPRESSION: Specimen radiograph of the left breast. Electronically Signed   By: Franki Cabot M.D.   On: 09/05/2021 14:27 ? ?MM LT RADIOACTIVE SEED LOC MAMMO GUIDE ? ?Result Date: 09/04/2021 ?CLINICAL DATA:  Patient presents for radioactive seed localization prior to surgical excision of biopsy-proven malignancy over the upper-outer left breast marked by ribbon clip. EXAM: MAMMOGRAPHIC GUIDED RADIOACTIVE SEED LOCALIZATION OF THE LEFT BREAST COMPARISON:  Previous exam(s). FINDINGS: Patient presents for radioactive seed localization prior to surgical excision. I met with the patient and we discussed the procedure of seed localization including benefits and alternatives. We discussed the high likelihood of a successful procedure. We discussed the risks of the procedure including  infection, bleeding, tissue injury and further surgery. We discussed the low dose of radioactivity involved in the procedure. Informed, written consent was given. The usual time-out protocol was performed immediately prior t

## 2021-10-01 ENCOUNTER — Telehealth: Payer: Self-pay

## 2021-10-01 NOTE — Telephone Encounter (Signed)
Verified patient identity and reminded patient of their 8:00am-10/02/21 in-person appointment w/ Shona Simpson PA-C. I advised patient to arrive 30mn early for check-in. I left my extension [phone] case patient needs anything. ?

## 2021-10-02 ENCOUNTER — Ambulatory Visit
Admission: RE | Admit: 2021-10-02 | Discharge: 2021-10-02 | Disposition: A | Discharge status: Home / Self Care | Payer: BC Managed Care – PPO | Source: Ambulatory Visit | Attending: Radiation Oncology | Admitting: Radiation Oncology

## 2021-10-02 ENCOUNTER — Other Ambulatory Visit: Payer: Self-pay

## 2021-10-02 ENCOUNTER — Ambulatory Visit: Payer: BC Managed Care – PPO

## 2021-10-02 ENCOUNTER — Encounter: Payer: Self-pay | Admitting: Radiation Oncology

## 2021-10-02 DIAGNOSIS — Z510 Encounter for antineoplastic radiation therapy: Secondary | ICD-10-CM | POA: Insufficient documentation

## 2021-10-02 DIAGNOSIS — C50412 Malignant neoplasm of upper-outer quadrant of left female breast: Secondary | ICD-10-CM | POA: Diagnosis present

## 2021-10-02 DIAGNOSIS — Z170 Estrogen receptor positive status [ER+]: Secondary | ICD-10-CM

## 2021-10-02 NOTE — Progress Notes (Signed)
Telephone follow-up. Verified patient identity and began nursing interview. Patient reports mild numbness w/ LT breast/ arm w/ full ROM. No other issues reported at this time. ? ?Meaningful use complete. ?Postmenopausal-NO chances of pregnancy. ? ?Reminded patient of her 8:30am-10/02/21 telephone appointment w/ Shona Simpson PA-C. I left my extension [phone] in case patient needs anything. Patient verbalized understanding of information given. ? ?Patient contact [phone] ?

## 2021-10-09 ENCOUNTER — Encounter: Payer: Self-pay | Admitting: *Deleted

## 2021-10-12 DIAGNOSIS — Z170 Estrogen receptor positive status [ER+]: Secondary | ICD-10-CM | POA: Insufficient documentation

## 2021-10-12 DIAGNOSIS — C50412 Malignant neoplasm of upper-outer quadrant of left female breast: Secondary | ICD-10-CM | POA: Insufficient documentation

## 2021-10-13 ENCOUNTER — Ambulatory Visit
Admission: RE | Admit: 2021-10-13 | Discharge: 2021-10-13 | Disposition: A | Discharge status: Home / Self Care | Payer: BC Managed Care – PPO | Source: Ambulatory Visit | Attending: Radiation Oncology | Admitting: Radiation Oncology

## 2021-10-13 ENCOUNTER — Other Ambulatory Visit: Payer: Self-pay

## 2021-10-13 DIAGNOSIS — C50412 Malignant neoplasm of upper-outer quadrant of left female breast: Secondary | ICD-10-CM | POA: Diagnosis not present

## 2021-10-14 ENCOUNTER — Ambulatory Visit
Admission: RE | Admit: 2021-10-14 | Discharge: 2021-10-14 | Disposition: A | Discharge status: Home / Self Care | Payer: BC Managed Care – PPO | Source: Ambulatory Visit | Attending: Radiation Oncology | Admitting: Radiation Oncology

## 2021-10-14 DIAGNOSIS — C50412 Malignant neoplasm of upper-outer quadrant of left female breast: Secondary | ICD-10-CM | POA: Diagnosis not present

## 2021-10-15 ENCOUNTER — Other Ambulatory Visit: Payer: Self-pay

## 2021-10-15 ENCOUNTER — Ambulatory Visit
Admission: RE | Admit: 2021-10-15 | Discharge: 2021-10-15 | Disposition: A | Discharge status: Home / Self Care | Payer: BC Managed Care – PPO | Source: Ambulatory Visit | Attending: Radiation Oncology | Admitting: Radiation Oncology

## 2021-10-15 DIAGNOSIS — C50412 Malignant neoplasm of upper-outer quadrant of left female breast: Secondary | ICD-10-CM | POA: Diagnosis not present

## 2021-10-16 ENCOUNTER — Ambulatory Visit
Admission: RE | Admit: 2021-10-16 | Discharge: 2021-10-16 | Disposition: A | Discharge status: Home / Self Care | Payer: BC Managed Care – PPO | Source: Ambulatory Visit | Attending: Radiation Oncology | Admitting: Radiation Oncology

## 2021-10-16 ENCOUNTER — Other Ambulatory Visit: Payer: Self-pay

## 2021-10-16 DIAGNOSIS — C50412 Malignant neoplasm of upper-outer quadrant of left female breast: Secondary | ICD-10-CM | POA: Diagnosis not present

## 2021-10-17 ENCOUNTER — Other Ambulatory Visit: Payer: Self-pay

## 2021-10-17 ENCOUNTER — Ambulatory Visit
Admission: RE | Admit: 2021-10-17 | Discharge: 2021-10-17 | Disposition: A | Discharge status: Home / Self Care | Payer: BC Managed Care – PPO | Source: Ambulatory Visit | Attending: Radiation Oncology | Admitting: Radiation Oncology

## 2021-10-17 DIAGNOSIS — C50412 Malignant neoplasm of upper-outer quadrant of left female breast: Secondary | ICD-10-CM | POA: Diagnosis not present

## 2021-10-17 DIAGNOSIS — Z170 Estrogen receptor positive status [ER+]: Secondary | ICD-10-CM

## 2021-10-17 MED ORDER — RADIAPLEXRX EX GEL: Freq: Once | CUTANEOUS | Status: AC

## 2021-10-17 NOTE — Progress Notes (Signed)
Pt here for patient teaching.  Pt given Radiation and You booklet, skin care instructions, and Radiaplex.  Patient was advised to obtain over the counter aluminum free deodorant.  Reviewed areas of pertinence such as fatigue, hair loss, skin changes, breast tenderness, and breast swelling. Pt able to give teach back of to pat skin and use unscented/gentle soap,apply Radiaplex bid, avoid applying anything to skin within 4 hours of treatment, avoid wearing an under wire bra, and to use an electric razor if they must shave. Pt verbalizes understanding of information given and will contact nursing with any questions or concerns.   ? ? ?

## 2021-10-20 ENCOUNTER — Other Ambulatory Visit: Payer: Self-pay

## 2021-10-20 ENCOUNTER — Ambulatory Visit
Admission: RE | Admit: 2021-10-20 | Discharge: 2021-10-20 | Disposition: A | Discharge status: Home / Self Care | Payer: BC Managed Care – PPO | Source: Ambulatory Visit | Attending: Radiation Oncology | Admitting: Radiation Oncology

## 2021-10-20 DIAGNOSIS — C50412 Malignant neoplasm of upper-outer quadrant of left female breast: Secondary | ICD-10-CM | POA: Diagnosis not present

## 2021-10-21 ENCOUNTER — Ambulatory Visit
Admission: RE | Admit: 2021-10-21 | Discharge: 2021-10-21 | Disposition: A | Discharge status: Home / Self Care | Payer: BC Managed Care – PPO | Source: Ambulatory Visit | Attending: Radiation Oncology | Admitting: Radiation Oncology

## 2021-10-21 DIAGNOSIS — C50412 Malignant neoplasm of upper-outer quadrant of left female breast: Secondary | ICD-10-CM | POA: Diagnosis not present

## 2021-10-22 ENCOUNTER — Telehealth: Payer: Self-pay | Admitting: Hematology

## 2021-10-22 ENCOUNTER — Ambulatory Visit
Admission: RE | Admit: 2021-10-22 | Discharge: 2021-10-22 | Disposition: A | Discharge status: Home / Self Care | Payer: BC Managed Care – PPO | Source: Ambulatory Visit | Attending: Radiation Oncology | Admitting: Radiation Oncology

## 2021-10-22 ENCOUNTER — Other Ambulatory Visit: Payer: Self-pay

## 2021-10-22 DIAGNOSIS — C50412 Malignant neoplasm of upper-outer quadrant of left female breast: Secondary | ICD-10-CM | POA: Diagnosis not present

## 2021-10-22 NOTE — Telephone Encounter (Signed)
.  Called patient to schedule appointment per 4/11 inbasket, patient is aware of date and time.   ?

## 2021-10-23 ENCOUNTER — Ambulatory Visit
Admission: RE | Admit: 2021-10-23 | Discharge: 2021-10-23 | Disposition: A | Discharge status: Home / Self Care | Payer: BC Managed Care – PPO | Source: Ambulatory Visit | Attending: Radiation Oncology | Admitting: Radiation Oncology

## 2021-10-23 DIAGNOSIS — C50412 Malignant neoplasm of upper-outer quadrant of left female breast: Secondary | ICD-10-CM | POA: Diagnosis not present

## 2021-10-24 ENCOUNTER — Other Ambulatory Visit: Payer: Self-pay

## 2021-10-24 ENCOUNTER — Ambulatory Visit
Admission: RE | Admit: 2021-10-24 | Discharge: 2021-10-24 | Disposition: A | Discharge status: Home / Self Care | Payer: BC Managed Care – PPO | Source: Ambulatory Visit | Attending: Radiation Oncology | Admitting: Radiation Oncology

## 2021-10-24 DIAGNOSIS — C50412 Malignant neoplasm of upper-outer quadrant of left female breast: Secondary | ICD-10-CM | POA: Diagnosis not present

## 2021-10-27 ENCOUNTER — Ambulatory Visit
Admission: RE | Admit: 2021-10-27 | Discharge: 2021-10-27 | Disposition: A | Discharge status: Home / Self Care | Payer: BC Managed Care – PPO | Source: Ambulatory Visit | Attending: Radiation Oncology | Admitting: Radiation Oncology

## 2021-10-27 DIAGNOSIS — C50412 Malignant neoplasm of upper-outer quadrant of left female breast: Secondary | ICD-10-CM | POA: Diagnosis not present

## 2021-10-28 ENCOUNTER — Other Ambulatory Visit: Payer: Self-pay

## 2021-10-28 ENCOUNTER — Ambulatory Visit
Admission: RE | Admit: 2021-10-28 | Discharge: 2021-10-28 | Disposition: A | Discharge status: Home / Self Care | Payer: BC Managed Care – PPO | Source: Ambulatory Visit | Attending: Radiation Oncology | Admitting: Radiation Oncology

## 2021-10-28 DIAGNOSIS — C50412 Malignant neoplasm of upper-outer quadrant of left female breast: Secondary | ICD-10-CM | POA: Diagnosis not present

## 2021-10-28 LAB — RAD ONC ARIA SESSION SUMMARY
Course Elapsed Days: 15
Plan Fractions Treated to Date: 12
Plan Prescribed Dose Per Fraction: 2.66 Gy
Plan Total Fractions Prescribed: 16
Plan Total Prescribed Dose: 42.56 Gy
Reference Point Dosage Given to Date: 31.92 Gy
Reference Point Session Dosage Given: 2.66 Gy
Session Number: 12

## 2021-10-29 ENCOUNTER — Other Ambulatory Visit: Payer: Self-pay

## 2021-10-29 ENCOUNTER — Ambulatory Visit
Admission: RE | Admit: 2021-10-29 | Discharge: 2021-10-29 | Disposition: A | Discharge status: Home / Self Care | Payer: BC Managed Care – PPO | Source: Ambulatory Visit | Attending: Radiation Oncology | Admitting: Radiation Oncology

## 2021-10-29 DIAGNOSIS — C50412 Malignant neoplasm of upper-outer quadrant of left female breast: Secondary | ICD-10-CM | POA: Diagnosis not present

## 2021-10-29 LAB — RAD ONC ARIA SESSION SUMMARY
Course Elapsed Days: 16
Plan Fractions Treated to Date: 13
Plan Prescribed Dose Per Fraction: 2.66 Gy
Plan Total Fractions Prescribed: 16
Plan Total Prescribed Dose: 42.56 Gy
Reference Point Dosage Given to Date: 34.58 Gy
Reference Point Session Dosage Given: 2.66 Gy
Session Number: 13

## 2021-10-30 ENCOUNTER — Ambulatory Visit
Admission: RE | Admit: 2021-10-30 | Discharge: 2021-10-30 | Disposition: A | Discharge status: Home / Self Care | Payer: BC Managed Care – PPO | Source: Ambulatory Visit | Attending: Radiation Oncology | Admitting: Radiation Oncology

## 2021-10-30 ENCOUNTER — Encounter: Payer: Self-pay | Admitting: *Deleted

## 2021-10-30 ENCOUNTER — Other Ambulatory Visit: Payer: Self-pay

## 2021-10-30 DIAGNOSIS — Z170 Estrogen receptor positive status [ER+]: Secondary | ICD-10-CM

## 2021-10-30 DIAGNOSIS — C50412 Malignant neoplasm of upper-outer quadrant of left female breast: Secondary | ICD-10-CM | POA: Diagnosis not present

## 2021-10-30 LAB — RAD ONC ARIA SESSION SUMMARY
Course Elapsed Days: 17
Plan Fractions Treated to Date: 14
Plan Prescribed Dose Per Fraction: 2.66 Gy
Plan Total Fractions Prescribed: 16
Plan Total Prescribed Dose: 42.56 Gy
Reference Point Dosage Given to Date: 37.24 Gy
Reference Point Session Dosage Given: 2.66 Gy
Session Number: 14

## 2021-10-31 ENCOUNTER — Other Ambulatory Visit: Payer: Self-pay

## 2021-10-31 ENCOUNTER — Ambulatory Visit
Admission: RE | Admit: 2021-10-31 | Discharge: 2021-10-31 | Disposition: A | Discharge status: Home / Self Care | Payer: BC Managed Care – PPO | Source: Ambulatory Visit | Attending: Radiation Oncology | Admitting: Radiation Oncology

## 2021-10-31 ENCOUNTER — Ambulatory Visit: Payer: BC Managed Care – PPO | Admitting: Radiation Oncology

## 2021-10-31 DIAGNOSIS — C50412 Malignant neoplasm of upper-outer quadrant of left female breast: Secondary | ICD-10-CM | POA: Diagnosis not present

## 2021-10-31 LAB — RAD ONC ARIA SESSION SUMMARY
Course Elapsed Days: 18
Plan Fractions Treated to Date: 15
Plan Prescribed Dose Per Fraction: 2.66 Gy
Plan Total Fractions Prescribed: 16
Plan Total Prescribed Dose: 42.56 Gy
Reference Point Dosage Given to Date: 39.9 Gy
Reference Point Session Dosage Given: 2.66 Gy
Session Number: 15

## 2021-10-31 MED ORDER — ALRA NON-METALLIC DEODORANT (RAD-ONC)
1.0000 "application " | Freq: Once | TOPICAL | Status: AC
  Administered 2021-10-31: 1 via TOPICAL

## 2021-11-03 ENCOUNTER — Other Ambulatory Visit: Payer: Self-pay

## 2021-11-03 ENCOUNTER — Ambulatory Visit
Admission: RE | Admit: 2021-11-03 | Discharge: 2021-11-03 | Disposition: A | Discharge status: Home / Self Care | Payer: BC Managed Care – PPO | Source: Ambulatory Visit | Attending: Radiation Oncology | Admitting: Radiation Oncology

## 2021-11-03 DIAGNOSIS — C50412 Malignant neoplasm of upper-outer quadrant of left female breast: Secondary | ICD-10-CM | POA: Diagnosis not present

## 2021-11-03 LAB — RAD ONC ARIA SESSION SUMMARY
Course Elapsed Days: 21
Plan Fractions Treated to Date: 16
Plan Prescribed Dose Per Fraction: 2.66 Gy
Plan Total Fractions Prescribed: 16
Plan Total Prescribed Dose: 42.56 Gy
Reference Point Dosage Given to Date: 42.56 Gy
Reference Point Session Dosage Given: 2.66 Gy
Session Number: 16

## 2021-11-04 ENCOUNTER — Other Ambulatory Visit: Payer: Self-pay

## 2021-11-04 ENCOUNTER — Ambulatory Visit
Admission: RE | Admit: 2021-11-04 | Discharge: 2021-11-04 | Disposition: A | Discharge status: Home / Self Care | Payer: BC Managed Care – PPO | Source: Ambulatory Visit | Attending: Radiation Oncology | Admitting: Radiation Oncology

## 2021-11-04 DIAGNOSIS — C50412 Malignant neoplasm of upper-outer quadrant of left female breast: Secondary | ICD-10-CM | POA: Diagnosis not present

## 2021-11-04 LAB — RAD ONC ARIA SESSION SUMMARY
Course Elapsed Days: 22
Plan Fractions Treated to Date: 1
Plan Prescribed Dose Per Fraction: 2 Gy
Plan Total Fractions Prescribed: 4
Plan Total Prescribed Dose: 8 Gy
Reference Point Dosage Given to Date: 44.56 Gy
Reference Point Session Dosage Given: 2 Gy
Session Number: 17

## 2021-11-05 ENCOUNTER — Ambulatory Visit
Admission: RE | Admit: 2021-11-05 | Discharge: 2021-11-05 | Disposition: A | Discharge status: Home / Self Care | Payer: BC Managed Care – PPO | Source: Ambulatory Visit | Attending: Radiation Oncology | Admitting: Radiation Oncology

## 2021-11-05 ENCOUNTER — Other Ambulatory Visit: Payer: Self-pay

## 2021-11-05 DIAGNOSIS — C50412 Malignant neoplasm of upper-outer quadrant of left female breast: Secondary | ICD-10-CM | POA: Diagnosis not present

## 2021-11-05 LAB — RAD ONC ARIA SESSION SUMMARY
Course Elapsed Days: 23
Plan Fractions Treated to Date: 2
Plan Prescribed Dose Per Fraction: 2 Gy
Plan Total Fractions Prescribed: 4
Plan Total Prescribed Dose: 8 Gy
Reference Point Dosage Given to Date: 46.56 Gy
Reference Point Session Dosage Given: 2 Gy
Session Number: 18

## 2021-11-06 ENCOUNTER — Ambulatory Visit
Admission: RE | Admit: 2021-11-06 | Discharge: 2021-11-06 | Disposition: A | Discharge status: Home / Self Care | Payer: BC Managed Care – PPO | Source: Ambulatory Visit | Attending: Radiation Oncology | Admitting: Radiation Oncology

## 2021-11-06 ENCOUNTER — Other Ambulatory Visit: Payer: Self-pay

## 2021-11-06 DIAGNOSIS — C50412 Malignant neoplasm of upper-outer quadrant of left female breast: Secondary | ICD-10-CM | POA: Diagnosis not present

## 2021-11-06 LAB — RAD ONC ARIA SESSION SUMMARY
Course Elapsed Days: 24
Plan Fractions Treated to Date: 3
Plan Prescribed Dose Per Fraction: 2 Gy
Plan Total Fractions Prescribed: 4
Plan Total Prescribed Dose: 8 Gy
Reference Point Dosage Given to Date: 48.56 Gy
Reference Point Session Dosage Given: 2 Gy
Session Number: 19

## 2021-11-07 ENCOUNTER — Inpatient Hospital Stay: Payer: BC Managed Care – PPO | Attending: Hematology | Admitting: Hematology

## 2021-11-07 ENCOUNTER — Encounter: Payer: Self-pay | Admitting: Radiation Oncology

## 2021-11-07 ENCOUNTER — Ambulatory Visit
Admission: RE | Admit: 2021-11-07 | Discharge: 2021-11-07 | Disposition: A | Discharge status: Home / Self Care | Payer: BC Managed Care – PPO | Source: Ambulatory Visit | Attending: Radiation Oncology | Admitting: Radiation Oncology

## 2021-11-07 ENCOUNTER — Encounter: Payer: Self-pay | Admitting: Hematology

## 2021-11-07 ENCOUNTER — Other Ambulatory Visit: Payer: Self-pay

## 2021-11-07 VITALS — BP 129/80 | HR 69 | Temp 98.4°F | Resp 17 | Wt 165.4 lb

## 2021-11-07 DIAGNOSIS — Z510 Encounter for antineoplastic radiation therapy: Secondary | ICD-10-CM | POA: Insufficient documentation

## 2021-11-07 DIAGNOSIS — Z923 Personal history of irradiation: Secondary | ICD-10-CM | POA: Insufficient documentation

## 2021-11-07 DIAGNOSIS — Z79899 Other long term (current) drug therapy: Secondary | ICD-10-CM | POA: Insufficient documentation

## 2021-11-07 DIAGNOSIS — M85852 Other specified disorders of bone density and structure, left thigh: Secondary | ICD-10-CM | POA: Diagnosis not present

## 2021-11-07 DIAGNOSIS — Z170 Estrogen receptor positive status [ER+]: Secondary | ICD-10-CM | POA: Insufficient documentation

## 2021-11-07 DIAGNOSIS — C50412 Malignant neoplasm of upper-outer quadrant of left female breast: Secondary | ICD-10-CM | POA: Insufficient documentation

## 2021-11-07 DIAGNOSIS — Z79811 Long term (current) use of aromatase inhibitors: Secondary | ICD-10-CM | POA: Insufficient documentation

## 2021-11-07 LAB — RAD ONC ARIA SESSION SUMMARY
Course Elapsed Days: 25
Plan Fractions Treated to Date: 4
Plan Prescribed Dose Per Fraction: 2 Gy
Plan Total Fractions Prescribed: 4
Plan Total Prescribed Dose: 8 Gy
Reference Point Dosage Given to Date: 50.56 Gy
Reference Point Session Dosage Given: 2 Gy
Session Number: 20

## 2021-11-07 MED ORDER — ANASTROZOLE 1 MG PO TABS: 1.0000 mg | ORAL_TABLET | Freq: Every day | ORAL | 3 refills | Status: DC

## 2021-11-07 NOTE — Progress Notes (Addendum)
?[facility]   ?Telephone:([phone] [phone]   ?Clinic Follow up Note  ? ?Patient Care Team: ?[name], MD as PCP - General (Internal Medicine) ?Mauro Kaufmann, RN as Oncology Nurse Navigator ?Rockwell [place], RN as Oncology Nurse Navigator ?Jovita Kussmaul, MD as Consulting Physician (General Surgery) ?Truitt Merle, MD as Consulting Physician (Hematology) ?Kyung Rudd, MD as Consulting Physician (Radiation Oncology) ?Arvella Nigh, MD as Consulting Physician (Obstetrics and Gynecology) ? ?Date of Service:  11/07/2021 ? ?CHIEF COMPLAINT: f/u of left breast cancer ? ?CURRENT THERAPY:  ?To start anastrozole 11/2021 ? ?ASSESSMENT & PLAN:  ?Cresta Riden Sine is a 56 y.o. post-menopausal female with  ? ?1. Malignant neoplasm of upper-outer quadrant of left breast, invasive ductal carcinoma, stage IA, p(T1a, N0), ER+/PR+/HER2- ?-found on screening mammogram. S/p lumpectomy on 09/05/21 with Dr. Marlou Starks, path showed 0.3 cm invasive and in situ ductal carcinoma. Margins and lymph nodes negative. Oncotype not performed due to small tumor size. ?-she received adjuvant radiation 4/3-4/28/23 under Dr. Lisbeth Renshaw. ?-Giving the strong ER and PR expression in her postmenopausal status, I recommend adjuvant endocrine therapy with aromatase inhibitor for a total of 5 years to reduce the risk of cancer recurrence. Potential benefits and side effects were discussed with patient and she is interested. I recommend anastrozole and prescribed this for her today. ?-We also discussed the breast cancer surveillance after her surgery. She will continue annual screening mammogram, self exam, and a routine office visit with lab and exam with [name]. She will be due for mammogram in 06/2022. We also discussed the use of MRI as an additional screening tool; this would also be performed annually, so that ideally she is having some form of scan every 6 months, or every 2 years. ?  ?2. Genetics, (+) CDKN2A mutation  ?-she reports  a family history of ovarian and colon cancer. ?-testing performed on 08/11/21. Results showed a mutation in CDKN2A, and a VUS in RAD51C. ?-she discussed the results with counselor on 09/08/21, she is aware of increased risk of melanoma and pancreatic cancer. ?-her prior GI was Dr. Cristina Gong, who has recently retired. We discussed pancreatic cancer screening tools, including MRI and/or EUS. I offered referral to another GI that performs these procedures; she will think about it. ? ?3. Osteopenia ?-most recent DEXA showed osteopenia, with T score -1.4 in left hip  ?-I discussed that AI can reduce her bone density. I encouraged her to continue calcium and vit D. We will check her vit D level next visit. ?-we will repeat DEXA every 2 years at her GYN office. ? ?  ?Plan ?-start anastrozole in next month ?-survivorship in 3 months ?-lab and f/u in 6 months ?-will order screening breast MRI and abdominal MRI (for pancreatic cancer screening) later this year  ? ? ?No problem-specific Assessment & Plan notes found for this encounter. ? ? ?SUMMARY OF ONCOLOGIC HISTORY: ?Oncology History Overview Note  ? Cancer Staging  ?Malignant neoplasm of upper-outer quadrant of left breast in female, estrogen receptor positive ([place]) ?Staging form: Breast, AJCC 8th Edition ?- Clinical: Stage IA (cT1a, cN0, cM0, G1, ER+, PR+, HER2: Equivocal) - Unsigned ?- Pathologic stage from 09/05/2021: Stage IA (pT1a, pN0, cM0, G1, ER+, PR+, HER2-) - Signed by Truitt Merle, MD on 11/07/2021 ? ?  ?Malignant neoplasm of upper-outer quadrant of left breast in female, estrogen receptor positive ([place])  ?07/31/2021 Imaging  ? CLINICAL DATA:  Screening recall for a left breast asymmetry. ?  ?EXAM: ?DIGITAL  DIAGNOSTIC UNILATERAL LEFT MAMMOGRAM WITH TOMOSYNTHESIS AND CAD; ULTRASOUND LEFT BREAST LIMITED ? ?IMPRESSION: ?1. There is an indeterminate 3 mm mass in the left breast at 2 ?o'clock. ?  ?2.  No evidence of left axillary lymphadenopathy. ?  ?08/04/2021 Initial Biopsy   ? Diagnosis ?Breast, left, needle core biopsy, UOQ, 2 o'clock, 3cmfn, ribbon clip ?- INVASIVE DUCTAL CARCINOMA ?- DUCTAL CARCINOMA IN SITU ?- SEE COMMENT ? ?Microscopic Comment ?Based on the biopsy, the carcinoma appears Nottingham grade 1 of 3 and measures 0.5 cm in greatest linear extent. ? ?PROGNOSTIC INDICATORS ?Results: ?The tumor cells are EQUIVOCAL for Her2 (2+). Her2 by FISH will be performed and results reported separately. ?Estrogen Receptor: 100%, POSITIVE, STRONG STAINING INTENSITY ?Progesterone Receptor: 60%, POSITIVE, STRONG STAINING INTENSITY ?Proliferation Marker Ki67: 5% ?  ?08/11/2021 Initial Diagnosis  ? Malignant neoplasm of upper-outer quadrant of left breast in female, estrogen receptor positive ([place]) ?  ? Genetic Testing  ? [employer] Custom[employer] Panel identified a single pathogenic variant in the CDKN2A gene. Of note, a variant of uncertain significance was detected in the RAD51C gene ( p.I52L). Report date is 08/27/2021. ? ?[employer] CustomNext-Cancer+RNAinsight panel offered by [employer] includes sequencing and rearrangement analysis for the following 52 genes:  APC, ATM, AXIN2, BAP1, BARD1, BMPR1A, BRCA1, BRCA2, BRIP1, CDH1, CDK4, CDKN2A, CHEK2, CTNNA1, DICER1, EPCAM, FH, FLCN, GREM1, HOXB13, KIT, MEN1, MET, MITF, MLH1, MSH2, MSH3, MSH6, MUTYH, NBN, NF1, NTHL1, PALB2, PDGFRA, PMS2, POLD1, POLE, PTEN, RAD50, RAD51C, RAD51D, SDHA, SDHB, SDHC, SDHD, SMAD4, SMARCA4, STK11, TP53, TSC1, TSC2, and VHL.  RNA data is routinely analyzed for use in variant interpretation for all genes.  ?  ?09/05/2021 Cancer Staging  ? Staging form: Breast, AJCC 8th Edition ?- Pathologic stage from 09/05/2021: Stage IA (pT1a, pN0, cM0, G1, ER+, PR+, HER2-) - Signed by Truitt Merle, MD on 11/07/2021 ?Stage prefix: Initial diagnosis ?Histologic grading system: 3 grade system ?Residual tumor (R): R0 - None ? ?  ?09/05/2021 Definitive Surgery  ? FINAL MICROSCOPIC DIAGNOSIS:  ? ?A. LYMPH NODE, LEFT AXILLARY #1, SENTINEL, EXCISION:  ?-   No carcinoma identified in one lymph node (0/1)  ? ?B. LYMPH NODE, LEFT AXILLARY, SENTINEL, EXCISION:  ?-  No carcinoma identified in one lymph node (0/1)  ? ?C. LYMPH NODE, LEFT AXILLARY, SENTINEL, EXCISION:  ?-  No carcinoma identified in one lymph node (0/1)  ? ?D. LYMPH NODE, LEFT AXILLARY, SENTINEL, EXCISION:  ?-  No carcinoma identified in one lymph node (0/1)  ? ?E. BREAST, LEFT, LUMPECTOMY:  ?-  Invasive ductal carcinoma, Nottingham grade 1 of 3, 0.3 cm  ?-  Ductal carcinoma in-situ, low-grade  ?-  Calcifications associated with carcinoma  ?-  Margins uninvolved by carcinoma (0.25 cm; superior margin; see part F for final margin status)  ?-  Previous biopsy site changes present  ?-  See oncology table and comment below  ? ?F. BREAST, LEFT SUPERIOR AND DEEP MARGIN, EXCISION:  ?-  No residual carcinoma identified  ? ?G. BREAST, LEFT LATERAL MARGIN, EXCISION:  ?-  No residual carcinoma identified  ?  ? ? ? ?INTERVAL HISTORY:  ?Janeli A Koloski is here for a follow up of breast cancer. She was last seen by me on 08/11/21 in consultation. She presents to the clinic accompanied by a friend. ?She reports she is doing well overall, no severe side effects from radiation. ?  ?All other systems were reviewed with the patient and are negative. ? ?MEDICAL HISTORY:  ?Past Medical History:  ?Diagnosis Date  ?  Anxiety   ? GERD (gastroesophageal reflux disease)   ? Heart murmur   ? HTN (hypertension)   ? Hypertension   ? Migraine   ? Right ureteral stone   ? Wears contact lenses   ? ? ?SURGICAL HISTORY: ?Past Surgical History:  ?Procedure Laterality Date  ? BREAST BIOPSY Left 08/04/2021  ? BREAST LUMPECTOMY WITH RADIOACTIVE SEED AND SENTINEL LYMPH NODE BIOPSY Left 09/05/2021  ? Procedure: LEFT BREAST LUMPECTOMY WITH RADIOACTIVE SEED AND SENTINEL LYMPH NODE BIOPSY;  Surgeon: Jovita Kussmaul, MD;  Location: [facility];  Service: General;  Laterality: Left;  ? COLONOSCOPY  2015 approx  ? ? ?I have reviewed the  social history and family history with the patient and they are unchanged from previous note. ? ?ALLERGIES:  is allergic to adhesive [tape]. ? ?MEDICATIONS:  ?Current Outpatient Medications  ?Medication S

## 2021-12-01 NOTE — Progress Notes (Signed)
                                                                                                                                                             Patient Name: Wanda Castro MRN: [id number] DOB: 09/27/1965 Referring Physician: Truitt Merle (Profile Not Attached) Date of Service: 11/07/2021 [facility], [place]                                                        End Of Treatment Note  Diagnoses: C50.412-Malignant neoplasm of upper-outer quadrant of left female breast  Cancer Staging: Stage IA, pT1aN0M0, grade 1, ER/PR positive invasive ductal carcinoma of the left breast.   Intent: Curative  Radiation Treatment Dates: 10/13/2021 through 11/07/2021 Site Technique Total Dose (Gy) Dose per Fx (Gy) Completed Fx Beam Energies  Breast, Left: Breast_L 3D 42.56/42.56 2.66 16/16 6XFFF  Breast, Left: Breast_L_Bst 3D 8/8 2 4/4 6X   Narrative: The patient tolerated radiation therapy relatively well. She developed fatigue and anticipated skin changes in the treatment field.   Plan: The patient will receive a call in about one month from the radiation oncology department. She will continue follow up with Dr. Burr Medico as well.   ________________________________________________    Carola Rhine, [facility]

## 2021-12-15 ENCOUNTER — Ambulatory Visit: Payer: BC Managed Care – PPO | Attending: General Surgery

## 2021-12-15 VITALS — Wt 164.4 lb

## 2021-12-15 DIAGNOSIS — Z483 Aftercare following surgery for neoplasm: Secondary | ICD-10-CM | POA: Insufficient documentation

## 2021-12-15 NOTE — Therapy (Signed)
  OUTPATIENT PHYSICAL THERAPY SOZO SCREENING NOTE   Patient Name: Wanda Castro MRN: [id number] DOB:03/05/1966, 56 y.o., female Today's Date: 12/15/2021  PCP: [name], MD REFERRING PROVIDER: Jovita Kussmaul, MD   PT End of Session - 12/15/21 1042     Visit Number 2   # unchanged due to screen only   PT Start Time 1040    PT Stop Time 1044    PT Time Calculation (min) 4 min    Activity Tolerance Patient tolerated treatment well    Behavior During Therapy [facility] for tasks assessed/performed             Past Medical History:  Diagnosis Date   Anxiety    GERD (gastroesophageal reflux disease)    Heart murmur    HTN (hypertension)    Hypertension    Migraine    Right ureteral stone    Wears contact lenses    Past Surgical History:  Procedure Laterality Date   BREAST BIOPSY Left 08/04/2021   BREAST LUMPECTOMY WITH RADIOACTIVE SEED AND SENTINEL LYMPH NODE BIOPSY Left 09/05/2021   Procedure: LEFT BREAST LUMPECTOMY WITH RADIOACTIVE SEED AND SENTINEL LYMPH NODE BIOPSY;  Surgeon: Jovita Kussmaul, MD;  Location: [facility];  Service: General;  Laterality: Left;   COLONOSCOPY  2015 approx   Patient Active Problem List   Diagnosis Date Noted   Monoallelic mutation of [id number] gene 09/08/2021   Genetic testing 08/25/2021   Family history of colon cancer 08/13/2021   Family history of ovarian cancer 08/13/2021   Malignant neoplasm of upper-outer quadrant of left breast in female, estrogen receptor positive ([place]) 08/11/2021    REFERRING DIAG: left breast cancer at risk for lymphedema  THERAPY DIAG:  Aftercare following surgery for neoplasm  PERTINENT HISTORY: Left lumpectomy and SLNB 09/05/21 with 0/3 LN positive. GERD, HTN, migraines  PRECAUTIONS: left UE Lymphedema risk, None  SUBJECTIVE: Pt returns for her 3 month L-Dex screen.   PAIN:  Are you having pain? No  SOZO SCREENING: Patient was assessed today using the SOZO machine to determine the  lymphedema index score. This was compared to her baseline score. It was determined that she is within the recommended range when compared to her baseline and no further action is needed at this time. She will continue SOZO screenings. These are done every 3 months for 2 years post operatively followed by every 6 months for 2 years, and then annually.   Otelia Limes, PTA 12/15/2021, 10:46 AM

## 2021-12-16 ENCOUNTER — Ambulatory Visit
Admission: RE | Admit: 2021-12-16 | Discharge: 2021-12-16 | Disposition: A | Discharge status: Home / Self Care | Payer: BC Managed Care – PPO | Source: Ambulatory Visit | Attending: Radiation Oncology | Admitting: Radiation Oncology

## 2021-12-16 DIAGNOSIS — Z170 Estrogen receptor positive status [ER+]: Secondary | ICD-10-CM

## 2021-12-22 NOTE — Progress Notes (Addendum)
  Radiation Oncology         [phone] ________________________________  Name: Wanda Castro MRN: [id number]  Date of Service: 12/16/2021  DOB: 1965-10-17  Post Treatment Telephone Note  Diagnosis:   Stage IA, pT1aN0M0, grade 1, ER/PR positive invasive ductal carcinoma of the left breast.   Intent: Curative  Radiation Treatment Dates: 10/13/2021 through 11/07/2021 Site Technique Total Dose (Gy) Dose per Fx (Gy) Completed Fx Beam Energies  Breast, Left: Breast_L 3D 42.56/42.56 2.66 16/16 6XFFF  Breast, Left: Breast_L_Bst 3D 8/8 2 4/4 6X   Narrative: The patient tolerated radiation therapy relatively well. She developed fatigue and anticipated skin changes in the treatment field. She feels as though her skin changes have almost completely resolved.   Impression/Plan: 1.  Stage IA, pT1aN0M0, grade 1, ER/PR positive invasive ductal carcinoma of the left breast.  The patient has been doing well since completion of radiotherapy. We discussed that we would be happy to continue to follow her as needed, but she will also continue to follow up with Dr. Burr Medico in medical oncology. She was counseled on skin care as well as measures to avoid sun exposure to this area.  2. Survivorship. We discussed the importance of survivorship evaluation and encouraged her to attend her upcoming visit with that clinic.      Carola Rhine, PAC

## 2022-01-12 ENCOUNTER — Telehealth: Payer: Self-pay

## 2022-01-12 NOTE — Telephone Encounter (Signed)
Spoke with pt via telephone regarding her Anastrozole.  Pt accidentally took 2 doses of anastrozole today.  Pt wanted to know what should she do.  Spoke with Cira Rue, NP and Lacie instructed pt to not take Anastrozole tomorrow 01/13/2022 since she took 2 doses today and to resume her normal schedule on Wednesday.  Pt verbalized understanding and had no further questions at this time.

## 2022-01-29 ENCOUNTER — Encounter: Payer: Self-pay | Admitting: *Deleted

## 2022-01-29 ENCOUNTER — Other Ambulatory Visit: Payer: Self-pay | Admitting: *Deleted

## 2022-01-29 DIAGNOSIS — C50412 Malignant neoplasm of upper-outer quadrant of left female breast: Secondary | ICD-10-CM

## 2022-01-30 ENCOUNTER — Inpatient Hospital Stay: Payer: BC Managed Care – PPO | Attending: Hematology

## 2022-01-30 ENCOUNTER — Inpatient Hospital Stay (HOSPITAL_BASED_OUTPATIENT_CLINIC_OR_DEPARTMENT_OTHER): Payer: BC Managed Care – PPO | Admitting: Adult Health

## 2022-01-30 ENCOUNTER — Other Ambulatory Visit: Payer: Self-pay

## 2022-01-30 VITALS — BP 143/80 | HR 81 | Temp 97.9°F | Wt 165.1 lb

## 2022-01-30 DIAGNOSIS — Z8041 Family history of malignant neoplasm of ovary: Secondary | ICD-10-CM | POA: Diagnosis not present

## 2022-01-30 DIAGNOSIS — Z79899 Other long term (current) drug therapy: Secondary | ICD-10-CM | POA: Diagnosis not present

## 2022-01-30 DIAGNOSIS — C50412 Malignant neoplasm of upper-outer quadrant of left female breast: Secondary | ICD-10-CM | POA: Insufficient documentation

## 2022-01-30 DIAGNOSIS — Z801 Family history of malignant neoplasm of trachea, bronchus and lung: Secondary | ICD-10-CM | POA: Diagnosis not present

## 2022-01-30 DIAGNOSIS — Z8049 Family history of malignant neoplasm of other genital organs: Secondary | ICD-10-CM | POA: Diagnosis not present

## 2022-01-30 DIAGNOSIS — Z1501 Genetic susceptibility to malignant neoplasm of breast: Secondary | ICD-10-CM

## 2022-01-30 DIAGNOSIS — Z170 Estrogen receptor positive status [ER+]: Secondary | ICD-10-CM | POA: Insufficient documentation

## 2022-01-30 DIAGNOSIS — Z1509 Genetic susceptibility to other malignant neoplasm: Secondary | ICD-10-CM | POA: Diagnosis not present

## 2022-01-30 DIAGNOSIS — Z79811 Long term (current) use of aromatase inhibitors: Secondary | ICD-10-CM | POA: Diagnosis not present

## 2022-01-30 DIAGNOSIS — Z800 Family history of malignant neoplasm of digestive organs: Secondary | ICD-10-CM | POA: Insufficient documentation

## 2022-01-30 DIAGNOSIS — N898 Other specified noninflammatory disorders of vagina: Secondary | ICD-10-CM | POA: Diagnosis not present

## 2022-01-30 LAB — CMP (CANCER CENTER ONLY)
ALT: 18 U/L (ref 0–44)
AST: 21 U/L (ref 15–41)
Albumin: 4.1 g/dL (ref 3.5–5.0)
Alkaline Phosphatase: 68 U/L (ref 38–126)
Anion gap: 6 (ref 5–15)
BUN: 15 mg/dL (ref 6–20)
CO2: 31 mmol/L (ref 22–32)
Calcium: 9.4 mg/dL (ref 8.9–10.3)
Chloride: 104 mmol/L (ref 98–111)
Creatinine: 0.95 mg/dL (ref 0.44–1.00)
GFR, Estimated: >60 mL/min (ref >60)
Glucose, Bld: 101 mg/dL — ABNORMAL HIGH (ref 70–99)
Potassium: 3.5 mmol/L (ref 3.5–5.1)
Sodium: 141 mmol/L (ref 135–145)
Total Bilirubin: 0.5 mg/dL (ref 0.3–1.2)
Total Protein: 6.5 g/dL (ref 6.5–8.1)

## 2022-01-30 LAB — CBC WITH DIFFERENTIAL (CANCER CENTER ONLY)
Abs Immature Granulocytes: 0.01 10*3/uL (ref 0.00–0.07)
Basophils Absolute: 0 10*3/uL (ref 0.0–0.1)
Basophils Relative: 1 %
Eosinophils Absolute: 0.6 10*3/uL — ABNORMAL HIGH (ref 0.0–0.5)
Eosinophils Relative: 12 %
HCT: 37.1 % (ref 36.0–46.0)
Hemoglobin: 12.9 g/dL (ref 12.0–15.0)
Immature Granulocytes: 0 %
Lymphocytes Relative: 28 %
Lymphs Abs: 1.4 10*3/uL (ref 0.7–4.0)
MCH: 31.3 pg (ref 26.0–34.0)
MCHC: 34.8 g/dL (ref 30.0–36.0)
MCV: 90 fL (ref 80.0–100.0)
Monocytes Absolute: 0.3 10*3/uL (ref 0.1–1.0)
Monocytes Relative: 6 %
Neutro Abs: 2.6 10*3/uL (ref 1.7–7.7)
Neutrophils Relative %: 53 %
Platelet Count: 241 10*3/uL (ref 150–400)
RBC: 4.12 MIL/uL (ref 3.87–5.11)
RDW: 11.9 % (ref 11.5–15.5)
WBC Count: 5 10*3/uL (ref 4.0–10.5)
nRBC: 0 % (ref 0.0–0.2)

## 2022-01-30 NOTE — Progress Notes (Signed)
SURVIVORSHIP VISIT:   BRIEF ONCOLOGIC HISTORY:  Oncology History Overview Note   Cancer Staging  Malignant neoplasm of upper-outer quadrant of left breast in female, estrogen receptor positive (HCC) Staging form: Breast, AJCC 8th Edition - Clinical: Stage IA (cT1a, cN0, cM0, G1, ER+, PR+, HER2: Equivocal) - Unsigned - Pathologic stage from 09/05/2021: Stage IA (pT1a, pN0, cM0, G1, ER+, PR+, HER2-) - Signed by Malachy Mood, MD on 11/07/2021    Malignant neoplasm of upper-outer quadrant of left breast in female, estrogen receptor positive (HCC)  07/31/2021 Imaging   CLINICAL DATA:  Screening recall for a left breast asymmetry.   EXAM: DIGITAL DIAGNOSTIC UNILATERAL LEFT MAMMOGRAM WITH TOMOSYNTHESIS AND CAD; ULTRASOUND LEFT BREAST LIMITED  IMPRESSION: 1. There is an indeterminate 3 mm mass in the left breast at 2 o'clock.   2.  No evidence of left axillary lymphadenopathy.   08/04/2021 Initial Biopsy   Diagnosis Breast, left, needle core biopsy, UOQ, 2 o'clock, 3cmfn, ribbon clip - INVASIVE DUCTAL CARCINOMA - DUCTAL CARCINOMA IN SITU - SEE COMMENT  Microscopic Comment Based on the biopsy, the carcinoma appears Nottingham grade 1 of 3 and measures 0.5 cm in greatest linear extent.  PROGNOSTIC INDICATORS Results: The tumor cells are EQUIVOCAL for Her2 (2+). Her2 by FISH will be performed and results reported separately. 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 Of note, a variant of uncertain significance was detected in the RAD51C gene ( p.I52L). Report date is 08/27/2021.  [facility] Custom[employer]-Cancer+RNAinsight panel offered by [employer] includes sequencing and rearrangement analysis for the following 52 genes:  APC, ATM, AXIN2, BAP1, BARD1, BMPR1A, BRCA1, BRCA2, BRIP1, CDH1, CDK4, CDKN2A, CHEK2, CTNNA1, DICER1, EPCAM, FH, FLCN, GREM1, HOXB13, KIT, MEN1, MET, MITF, MLH1, MSH2, MSH3, MSH6, MUTYH, NBN, NF1, NTHL1, PALB2, PDGFRA, PMS2, POLD1, POLE, PTEN, RAD50, RAD51C, RAD51D, SDHA, SDHB, SDHC, SDHD, SMAD4, SMARCA4, STK11, TP53, TSC1, TSC2, and VHL.  RNA data is routinely analyzed for use in variant interpretation for all genes.    09/05/2021 Cancer Staging   Staging form: Breast, AJCC 8th Edition - Pathologic stage from 09/05/2021: Stage IA (pT1a, pN0, cM0, G1, ER+, PR+, HER2-) - Signed by Malachy Mood, MD on 11/07/2021 Stage prefix: Initial diagnosis Histologic grading system: 3 grade system Residual tumor (R): R0 - None   09/05/2021 Definitive Surgery   FINAL MICROSCOPIC DIAGNOSIS:   A. LYMPH NODE, LEFT AXILLARY #1, SENTINEL, EXCISION:  -  No carcinoma identified in one lymph node (0/1)   B. LYMPH NODE, LEFT AXILLARY, SENTINEL, EXCISION:  -  No carcinoma identified in one lymph node (0/1)   C. LYMPH NODE, LEFT AXILLARY, SENTINEL, EXCISION:  -  No carcinoma identified in one lymph node (0/1)   D. LYMPH NODE, LEFT AXILLARY, SENTINEL, EXCISION:  -  No carcinoma identified in one lymph node (0/1)   E. BREAST, LEFT, LUMPECTOMY:  -  Invasive ductal carcinoma, Nottingham grade 1 of 3, 0.3 cm  -  Ductal carcinoma in-situ, low-grade  -  Calcifications associated with carcinoma  -  Margins uninvolved by carcinoma (0.25 cm; superior margin; see part F for final margin status)  -  Previous biopsy site changes present  -  See oncology table and comment below  F. BREAST, LEFT SUPERIOR AND DEEP MARGIN, EXCISION:  -  No residual carcinoma identified   G. BREAST, LEFT LATERAL MARGIN, EXCISION:  -  No residual carcinoma identified    10/13/2021 - 11/07/2021 Radiation Therapy   Site Technique Total Dose (Gy) Dose per Fx (Gy)  Completed Fx Beam Energies  Breast, Left: Breast_L 3D 42.56/42.56 2.66 16/16 6XFFF  Breast, Left: Breast_L_Bst 3D 8/8 2 4/4 6X     11/2021 -  Anti-estrogen oral therapy   Anastrozole     INTERVAL HISTORY:  [name] to review her survivorship care plan detailing her treatment course for breast cancer, as well as monitoring long-term side effects of that treatment, education regarding health maintenance, screening, and overall wellness and health promotion.     Overall, [name] reports feeling quite well.  She is taking anastrozole daily and notes only mild increase in vaginal dryness.  She wants to know what to do about this.  REVIEW OF SYSTEMS:  Review of Systems  Constitutional:  Negative for appetite change, chills, fatigue, fever and unexpected weight change.  HENT:   Negative for hearing loss, lump/mass and trouble swallowing.   Eyes:  Negative for eye problems and icterus.  Respiratory:  Negative for chest tightness, cough and shortness of breath.   Cardiovascular:  Negative for chest pain, leg swelling and palpitations.  Gastrointestinal:  Negative for abdominal distention, abdominal pain, constipation, diarrhea, nausea and vomiting.  Endocrine: Negative for hot flashes.  Genitourinary:  Negative for difficulty urinating.   Musculoskeletal:  Negative for arthralgias.  Skin:  Negative for itching and rash.  Neurological:  Negative for dizziness, extremity weakness, headaches and numbness.  Hematological:  Negative for adenopathy. Does not bruise/bleed easily.  Psychiatric/Behavioral:  Negative for depression. The patient is not nervous/anxious.    Breast: Denies any new nodularity, masses, tenderness, nipple changes, or nipple discharge.      ONCOLOGY TREATMENT TEAM:  1. Surgeon:  Dr. Marlou Starks at [facility] 2. Medical Oncologist: Dr. Burr Medico  3. Radiation Oncologist: Dr. Lisbeth Renshaw    PAST MEDICAL/SURGICAL HISTORY:  Past Medical History:  Diagnosis Date   Anxiety     GERD (gastroesophageal reflux disease)    Heart murmur    HTN (hypertension)    Hypertension    Migraine    Right ureteral stone    Wears contact lenses    Past Surgical History:  Procedure Laterality Date   BREAST BIOPSY Left 08/04/2021   BREAST LUMPECTOMY WITH RADIOACTIVE SEED AND SENTINEL LYMPH NODE BIOPSY Left 09/05/2021   Procedure: LEFT BREAST LUMPECTOMY WITH RADIOACTIVE SEED AND SENTINEL LYMPH NODE BIOPSY;  Surgeon: Jovita Kussmaul, MD;  Location: [facility];  Service: General;  Laterality: Left;   COLONOSCOPY  2015 approx     ALLERGIES:  Allergies  Allergen Reactions   Adhesive [Tape] Other (See Comments)    Red irritated skin     CURRENT MEDICATIONS:  Outpatient Encounter Medications as of 01/30/2022  Medication Sig   ALPRAZolam (XANAX) 0.25 MG tablet Take 0.25 mg by mouth at bedtime as needed for anxiety.   anastrozole (ARIMIDEX) 1 MG tablet Take 1 tablet (1 mg total) by mouth daily.   Calcium Carb-Cholecalciferol (CALCIUM 600 + D PO) Take 1 tablet by mouth daily.   mometasone (ELOCON) 0.1 % cream mometasone 0.1 % topical cream  APPLY TO EXTERNAL EAR ONCE DAILY AS NEEDED ITCHING   No facility-administered encounter medications on file as of 01/30/2022.     ONCOLOGIC FAMILY HISTORY:  Family History  Problem Relation Age of Onset   Atrial fibrillation Mother    COPD Mother    Hypertension Father    Renal cancer Father 87   Cancer Maternal Uncle 36       colon cancer   Cancer Paternal Aunt        small cell lung cancer   Prostate cancer Paternal Uncle    Prostate cancer Paternal Uncle    Ovarian cancer Cousin 12       maternal first cousin   Lung cancer Cousin        paternal first cousin   Lung cancer Cousin        paternal first cousin   Uterine cancer Cousin        paternal first cousin, dx. 2s   Ovarian cancer Cousin        dx. 21s   Colon cancer Cousin        paternal first cousin, dx. 14s     SOCIAL HISTORY:  Social  History   Socioeconomic History   Marital status: Single    Spouse name: Not on file   Number of children: 0   Years of education: Not on file   Highest education level: Not on file  Occupational History   Occupation: She works in administration in [place] Use   Smoking status: Never   Smokeless tobacco: Never  Substance and Sexual Activity   Alcohol use: No   Drug use: No   Sexual activity: Not on file  Other Topics Concern   Not on file  Social History Narrative   Not on file   Social Determinants of Health   Financial Resource Strain: Not on file  Food Insecurity: Not on file  Transportation Needs: Not on file  Physical Activity: Not on file  Stress: Not on file  Social Connections: Not on file  Intimate Partner Violence: Not on file     OBSERVATIONS/OBJECTIVE:  BP (!) 143/80 (Patient Position: Sitting)   Pulse 81   Temp 97.9 F (36.6 C)   Wt 165 lb 1.6 oz (74.9 kg)   SpO2 98%   BMI 32.24 kg/m  GENERAL: Patient is a well appearing female in no acute distress HEENT:  Sclerae anicteric.  Oropharynx clear and moist. No ulcerations or evidence of oropharyngeal candidiasis. Neck is supple.  NODES:  No cervical, supraclavicular, or axillary lymphadenopathy palpated.  BREAST EXAM: Left breast status postlumpectomy and radiation no sign of local recurrence right breast is benign. LUNGS:  Clear to auscultation bilaterally.  No wheezes or rhonchi. HEART:  Regular rate and rhythm. No murmur appreciated. ABDOMEN:  Soft, nontender.  Positive, normoactive bowel sounds. No organomegaly palpated. MSK:  No focal spinal tenderness to palpation. Full range of motion bilaterally in the upper extremities. EXTREMITIES:  No peripheral edema.   SKIN:  Clear with no obvious rashes or skin changes. No nail dyscrasia. NEURO:  Nonfocal. Well oriented.  Appropriate affect.  LABORATORY DATA:  None for this visit.  DIAGNOSTIC IMAGING:  None for this visit.       ASSESSMENT AND PLAN:  Ms.. Wanda is a pleasant 56 y.o. female with Stage IA left breast invasive ductal carcinoma, ER+/PR+/HER2-, diagnosed in 07/2021, treated with lumpectomy, adjuvant radiation therapy, and anti-estrogen therapy with Anastrozole beginning in 11/2021.  She presents to the Survivorship Clinic for our initial meeting and routine follow-up post-completion of treatment for breast cancer.    1. Stage IA left breast cancer:  Ms. Assefa is continuing to recover from definitive treatment for breast cancer. She will follow-up with her medical oncologist, Dr. Burr Medico in 04/2023 with history and physical exam per surveillance protocol.  She will continue her anti-estrogen therapy with Anastrozole. Thus far, she is tolerating the Anastrozole well, with minimal side effects. She was instructed to make Dr. Lindi Adie or myself aware if she begins to experience any worsening side effects of the medication and I could see her back in clinic to help manage those side effects, as needed. Her mammogram is due 06/2022; orders placed today. Today, a comprehensive survivorship care plan and treatment summary was reviewed with the patient today detailing her breast cancer diagnosis, treatment course, potential late/long-term effects of treatment, appropriate follow-up care with recommendations for the future, and patient education resources.  A copy of this summary, along with a letter will be sent to the patient's primary care provider via mail/fax/In Basket message after today's visit.    2.  Vaginal dryness: I recommended vitamin E suppositories and coconut oil as a moisturizer.  3. Bone health:  Given Ms. Prasad's age/history of breast cancer and her current treatment regimen including anti-estrogen therapy with Anastrozole, she is at risk for bone demineralization.   She was given education on specific activities to promote bone health.  4. Cancer screening:  Due to Ms. Tobia's history and her age, she should receive  screening for skin cancers, colon cancer, and gynecologic cancers.  The information and recommendations are listed on the patient's comprehensive care plan/treatment summary and were reviewed in detail with the patient.    5. Health maintenance and wellness promotion: Ms. Dittrich was encouraged to consume 5-7 servings of fruits and vegetables per day. We reviewed the "Nutrition Rainbow" handout.  She was also encouraged to engage in moderate to vigorous exercise for 30 minutes per day most days of the week. We discussed the [employer] fitness program, which is designed for cancer survivors to help them become more physically fit after cancer treatments.  She was instructed to limit her alcohol consumption and continue to abstain from tobacco use.     6. Support services/counseling: It is not uncommon for this period of the patient's cancer care trajectory to be one of many emotions and stressors.   She was given information regarding our available services and encouraged to contact me with any questions or for help enrolling in any of our support group/programs.    Follow up instructions:    -Return to cancer center in 3 months for f/u  -Mammogram due in 06/2022 -MRI of the breast and abdomen has been ordered for June 2024.  I recommended in the meantime she follow-up with Dr. Bryan Lemma. -Follow up with surgery 6 months -She is welcome to return back to the Survivorship Clinic at any time; no additional follow-up needed at this time.  -Consider referral back to survivorship as a long-term survivor for continued surveillance  The patient was provided an opportunity to ask questions and all were answered. The patient agreed with the plan and demonstrated an understanding of the instructions.   Total encounter time:40 minutes*in face-to-face visit time, chart review, lab review, care coordination, order entry, and documentation of the encounter time.    Wilber Bihari, NP 01/30/22 8:08  AM Medical Oncology and Hematology [facility] [place], [place] [address] Tel. [phone]    Fax. [phone]  *Total Encounter Time as defined by the Centers for Medicare and Medicaid Services includes, in  addition to the face-to-face time of a patient visit (documented in the note above) non-face-to-face time: obtaining and reviewing outside history, ordering and reviewing medications, tests or procedures, care coordination (communications with other health care professionals or caregivers) and documentation in the medical record.

## 2022-02-23 ENCOUNTER — Telehealth: Payer: Self-pay

## 2022-02-23 NOTE — Telephone Encounter (Signed)
Pt LVM stating that she's having chest wall tightnes/pain on her Rt side.  Pt had a lumpectomy from the left breast; therefore, pt is concerned about the pain she's experiencing now in her rt breast area.  Pt stated she's taking Ibuprofen for the pain which is help some but is mostly concern as to the cause of the pain.  Pt is currently on vacation out of time but like for Dr. Burr Medico to call her.  Notified Dr. Burr Medico.

## 2022-03-02 NOTE — Progress Notes (Unsigned)
[place]   [phone] [phone]   Clinic Follow up Note   Patient Care Team: [name], MD as PCP - General (Internal Medicine) Jovita Kussmaul, MD as Consulting Physician (General Surgery) Truitt Merle, MD as Consulting Physician (Hematology) Kyung Rudd, MD as Consulting Physician (Radiation Oncology) Arvella Nigh, MD as Consulting Physician (Obstetrics and Gynecology) 03/03/2022  CHIEF COMPLAINT: Follow up right breast pain in the setting of recent left breast cancer    SUMMARY OF ONCOLOGIC HISTORY: Oncology History Overview Note   Cancer Staging  Malignant neoplasm of upper-outer quadrant of left breast in female, estrogen receptor positive ([place]) Staging form: Breast, AJCC 8th Edition - Clinical: Stage IA (cT1a, cN0, cM0, G1, ER+, PR+, HER2: Equivocal) - Unsigned - Pathologic stage from 09/05/2021: Stage IA (pT1a, pN0, cM0, G1, ER+, PR+, HER2-) - Signed by Truitt Merle, MD on 11/07/2021    Malignant neoplasm of upper-outer quadrant of left breast in female, estrogen receptor positive ([place])  07/31/2021 Imaging   CLINICAL DATA:  Screening recall for a left breast asymmetry.   EXAM: DIGITAL DIAGNOSTIC UNILATERAL LEFT MAMMOGRAM WITH TOMOSYNTHESIS AND CAD; ULTRASOUND LEFT BREAST LIMITED  IMPRESSION: 1. There is an indeterminate 3 mm mass in the left breast at 2 o'clock.   2.  No evidence of left axillary lymphadenopathy.   08/04/2021 Initial Biopsy   Diagnosis Breast, left, needle core biopsy, UOQ, 2 o'clock, 3cmfn, ribbon clip - INVASIVE DUCTAL CARCINOMA - DUCTAL CARCINOMA IN SITU - SEE COMMENT  Microscopic Comment Based on the biopsy, the carcinoma appears Nottingham grade 1 of 3 and measures 0.5 cm in greatest linear extent.  PROGNOSTIC INDICATORS Results: The tumor cells are EQUIVOCAL for Her2 (2+). Her2 by FISH will be performed and results reported separately. 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 Of note, a variant of uncertain significance was detected in the RAD51C gene ( p.I52L). Report date is 08/27/2021.  [facility] CustomNext-Cancer+RNAinsight panel offered by [employer] includes sequencing and rearrangement analysis for the following 52 genes:  APC, ATM, AXIN2, BAP1, BARD1, BMPR1A, BRCA1, BRCA2, BRIP1, CDH1, CDK4, CDKN2A, CHEK2, CTNNA1, DICER1, EPCAM, FH, FLCN, GREM1, HOXB13, KIT, MEN1, MET, MITF, MLH1, MSH2, MSH3, MSH6, MUTYH, NBN, NF1, NTHL1, PALB2, PDGFRA, PMS2, POLD1, POLE, PTEN, RAD50, RAD51C, RAD51D, SDHA, SDHB, SDHC, SDHD, SMAD4, SMARCA4, STK11, TP53, TSC1, TSC2, and VHL.  RNA data is routinely analyzed for use in variant interpretation for all genes.    09/05/2021 Cancer Staging   Staging form: Breast, AJCC 8th Edition - Pathologic stage from 09/05/2021: Stage IA (pT1a, pN0, cM0, G1, ER+, PR+, HER2-) - Signed by Truitt Merle, MD on 11/07/2021 Stage prefix: Initial diagnosis Histologic grading system: 3 grade system Residual tumor (R): R0 - None   09/05/2021 Definitive Surgery   FINAL MICROSCOPIC DIAGNOSIS:   A. LYMPH NODE, LEFT AXILLARY #1, SENTINEL, EXCISION:  -  No carcinoma identified in one lymph node (0/1)   B. LYMPH NODE, LEFT AXILLARY, SENTINEL, EXCISION:  -  No carcinoma identified in one lymph node (0/1)   C. LYMPH NODE, LEFT AXILLARY, SENTINEL, EXCISION:  -  No carcinoma identified in one lymph node (0/1)   D. LYMPH NODE, LEFT AXILLARY, SENTINEL, EXCISION:  -  No carcinoma identified in one lymph node (0/1)   E. BREAST, LEFT, LUMPECTOMY:  -  Invasive ductal carcinoma, Nottingham grade 1 of 3, 0.3 cm  -  Ductal carcinoma  in-situ, low-grade  -  Calcifications associated with carcinoma  -  Margins uninvolved by carcinoma (0.25 cm; superior margin; see part F for final margin status)  -  Previous biopsy site changes present  -  See oncology table and comment below   F. BREAST, LEFT SUPERIOR AND DEEP MARGIN, EXCISION:  -  No residual carcinoma identified   G. BREAST, LEFT LATERAL MARGIN, EXCISION:  -  No residual carcinoma identified    10/13/2021 - 11/07/2021 Radiation Therapy   Site Technique Total Dose (Gy) Dose per Fx (Gy) Completed Fx Beam Energies  Breast, Left: Breast_L 3D 42.56/42.56 2.66 16/16 6XFFF  Breast, Left: Breast_L_Bst 3D 8/8 2 4/4 6X     11/2021 -  Anti-estrogen oral therapy   Anastrozole     CURRENT THERAPY: Adjuvant anastrozole for left side breast cancer, started 11/2021  INTERVAL HISTORY: Ms. Tech presents for symptom management visit, here with her cousin. Last seen by Wilber Bihari, NP 01/30/22 for Survivorship. She called 8/14 while out of town to report new right lower breast/chest wall pain/tightness after using a broom to clean. I recommended symptom management with NSAIDs and heat, she is here today for follow up. Pain present now almost 2 weeks. She wasn't able [place] do heat [place] the beach bc she [place]'t have heating pad, but has done it a few times since she got home. Pain is possibly better but can't tell a huge difference. She feels "pulling" pain with certain movements, leaning forward, and coughing. Sometimes when she touches the R breast pain is not there.  Left breast is unchanged since surgery, still numb in the axilla. Denies obvious fall, injury, new/worsening cough, chest pain, dyspnea, fever, chills, warmth, redness, nipple discharge or inversion, rib pain, abdominal bloating/pain.   She is tolerating anastrozole without bone pain or hot flashes. She has vivid dreams at times, but tolerable. She is tearful and worried, mind "goes to the worst place."  All other systems were  reviewed with the patient and are negative.  MEDICAL HISTORY:  Past Medical History:  Diagnosis Date   Anxiety    GERD (gastroesophageal reflux disease)    Heart murmur    HTN (hypertension)    Hypertension    Migraine    Right ureteral stone    Wears contact lenses     SURGICAL HISTORY: Past Surgical History:  Procedure Laterality Date   BREAST BIOPSY Left 08/04/2021   BREAST LUMPECTOMY WITH RADIOACTIVE SEED AND SENTINEL LYMPH NODE BIOPSY Left 09/05/2021   Procedure: LEFT BREAST LUMPECTOMY WITH RADIOACTIVE SEED AND SENTINEL LYMPH NODE BIOPSY;  Surgeon: Jovita Kussmaul, MD;  Location: [facility];  Service: General;  Laterality: Left;   COLONOSCOPY  2015 approx    I have reviewed the social history and family history with the patient and they are unchanged from previous note.  ALLERGIES:  is allergic to adhesive [tape].  MEDICATIONS:  Current Outpatient Medications  Medication Sig Dispense Refill   ALPRAZolam (XANAX) 0.25 MG tablet Take 0.25 mg by mouth at bedtime as needed for anxiety. (Patient not taking: Reported on 01/30/2022)     anastrozole (ARIMIDEX) 1 MG tablet Take 1 tablet (1 mg total) by mouth daily. 30 tablet 3   Calcium Carb-Cholecalciferol (CALCIUM 600 + D PO) Take 1 tablet by mouth daily.  losartan-hydrochlorothiazide (HYZAAR) 100-12.5 MG tablet Take by mouth daily.     mometasone (ELOCON) 0.1 % cream mometasone 0.1 % topical cream  APPLY TO EXTERNAL EAR ONCE DAILY AS NEEDED ITCHING     Pyridoxine HCl (VITAMIN B6) 100 MG TABS Take 100 mg by mouth daily.     No current facility-administered medications for this visit.    PHYSICAL EXAMINATION:  Vitals:   03/03/22 1200  BP: (!) 145/78  Pulse: 92  Resp: 15  Temp: 98.8 F (37.1 C)  SpO2: 99%   Filed Weights   03/03/22 1200  Weight: 166 lb 11.2 oz (75.6 kg)    GENERAL:alert, no distress and comfortable SKIN: No rash EYES:  sclera clear NECK: Without mass LYMPH:  no palpable cervical  or supraclavicular lymphadenopathy  LUNGS: clear with normal breathing effort HEART: regular rate & rhythm, no lower extremity edema ABDOMEN:abdomen soft, non-tender and normal bowel sounds.  No palpable hepatomegaly or mass. No RUQ tenderness Musculoskeletal: No focal spinal or rib pain on palpation NEURO: alert & oriented x 3 with fluent speech, no focal motor/sensory deficits Breast exam: Mild asymmetry without bilateral nipple discharge, inversion, edema, erythema, or warmth. Dense tissue felt in the central R breast without palpable mass or nodularity in either breast or axilla that I could appreciate. S/p L lumpectomy, incision healed with minimal scar tissue.  LABORATORY DATA:  I have reviewed the data as listed    Latest Ref Rng & Units 01/30/2022    9:00 AM 11/28/2015   11:26 PM  CBC  WBC 4.0 - 10.5 K/uL 5.0  11.2   Hemoglobin 12.0 - 15.0 g/dL 12.9  12.4   Hematocrit 36.0 - 46.0 % 37.1  37.2   Platelets 150 - 400 K/uL 241  255         Latest Ref Rng & Units 01/30/2022    9:00 AM 09/01/2021    9:07 AM 11/28/2015   11:26 PM  CMP  Glucose 70 - 99 mg/dL 101  94  156   BUN 6 - 20 mg/dL _0 Creatinine 0.44 - 1.00 mg/dL 0.95  0.95  1.00   Sodium 135 - 145 mmol/L 141  139  139   Potassium 3.5 - 5.1 mmol/L 3.5  3.8  3.3   Chloride 98 - 111 mmol/L 104  102  101   CO2 22 - 32 mmol/L _1 Calcium 8.9 - 10.3 mg/dL 9.4  9.0  9.1   Total Protein 6.5 - 8.1 g/dL 6.5   6.7   Total Bilirubin 0.3 - 1.2 mg/dL 0.5   0.7   Alkaline Phos 38 - 126 U/L 68   70   AST 15 - 41 U/L 21   22   ALT 0 - 44 U/L 18   17       RADIOGRAPHIC STUDIES: I have personally reviewed the radiological images as listed and agreed with the findings in the report. No results found.   ASSESSMENT & PLAN: 56 yo postmenopausal female   1.Right breast pain -onset 02/19/22 at the inferior right breast, no obvious precipitating event except using a broom to clean a few days prior -Tried NSAIDs and  occasional heat while out of town, pain not significantly improved -Exam shows no signs of infection/cellulitis or palpable mass, no rib or RUQ pain. Lungs clear -Proceed with right diagnostic mammogram and possible ultrasound for further evaluation, I will call her with results -Unlikely due  to anastrozole but okay to hold during work-up to see if this has any impact -Follow-up pending work-up  2. Malignant neoplasm of upper-outer quadrant of left breast, invasive ductal carcinoma, stage IA, p(T1a, N0), ER+/PR+/HER2- -screening detected, diagnosed 07/2021. S/p lumpectomy on 09/05/21 with Dr. Marlou Starks, path showed 0.3 cm invasive and in situ ductal carcinoma. Margins and lymph nodes negative. Oncotype not performed due to small tumor size. -S/p adjuvant radiation 4/3-4/28/23 under Dr. Lisbeth Renshaw. -Giving the strong ER and PR expression in her postmenopausal status, in 11/2021 she began adjuvant endocrine therapy with aromatase inhibitor for a total of 5 years to reduce the risk of cancer recurrence.  Tolerating well so far with vivid dreams, no bone pain or hot flashes  -Due bilateral mammogram in December 2023, also a candidate for screening breast MRI staggered 6 months apart (q. 1-2 years)  3. CKDN2A+ mutation -see Dr. Ernestina Penna note 11/07/21, not discussed today  4. Osteopenia  -on calcium and vitamin D  PLAN: -R diagnostic mammo/US, will call with results -OK to hold anastrozole during work up to see if this impacts breast pain -f/up pending work up    Orders Placed This Encounter  Procedures   MM DIAG BREAST TOMO UNI RIGHT    Standing Status:   Future    Standing Expiration Date:   03/04/2023    Order Specific Question:   Reason for Exam (SYMPTOM  OR DIAGNOSIS REQUIRED)    Answer:   2 week h/o right breast pain in pt with h/o L breast cancer 07/2021 s/p lumpectomy and RT, on AI    Order Specific Question:   Is the patient pregnant?    Answer:   No    Order Specific Question:   Preferred imaging  location?    Answer:   [facility]   US BREAST LTD UNI RIGHT INC AXILLA    Standing Status:   Future    Standing Expiration Date:   03/04/2023    Order Specific Question:   Reason for Exam (SYMPTOM  OR DIAGNOSIS REQUIRED)    Answer:   2 week h/o right breast pain in pt with h/o L breast cancer 07/2021 s/p lumpectomy and RT, on AI    Order Specific Question:   Preferred imaging location?    Answer:   [facility]   All questions were answered. The patient knows to call the clinic with any problems, questions or concerns. No barriers to learning was detected. I spent 20 minutes counseling the patient face to face. The total time spent in the appointment was 30 minutes and more than 50% was on chart review, counseling, and coordination of care.      Alla Feeling, NP 03/03/22

## 2022-03-03 ENCOUNTER — Ambulatory Visit
Admission: RE | Admit: 2022-03-03 | Discharge: 2022-03-03 | Disposition: A | Discharge status: Home / Self Care | Payer: BC Managed Care – PPO | Source: Ambulatory Visit | Attending: Nurse Practitioner | Admitting: Nurse Practitioner

## 2022-03-03 ENCOUNTER — Inpatient Hospital Stay: Payer: BC Managed Care – PPO | Attending: Hematology | Admitting: Nurse Practitioner

## 2022-03-03 ENCOUNTER — Telehealth: Payer: Self-pay

## 2022-03-03 ENCOUNTER — Other Ambulatory Visit: Payer: Self-pay

## 2022-03-03 ENCOUNTER — Encounter: Payer: Self-pay | Admitting: Nurse Practitioner

## 2022-03-03 VITALS — BP 145/78 | HR 92 | Temp 98.8°F | Resp 15 | Wt 166.7 lb

## 2022-03-03 DIAGNOSIS — Z79811 Long term (current) use of aromatase inhibitors: Secondary | ICD-10-CM | POA: Insufficient documentation

## 2022-03-03 DIAGNOSIS — N644 Mastodynia: Secondary | ICD-10-CM

## 2022-03-03 DIAGNOSIS — Z923 Personal history of irradiation: Secondary | ICD-10-CM | POA: Diagnosis not present

## 2022-03-03 DIAGNOSIS — C50412 Malignant neoplasm of upper-outer quadrant of left female breast: Secondary | ICD-10-CM | POA: Insufficient documentation

## 2022-03-03 DIAGNOSIS — Z170 Estrogen receptor positive status [ER+]: Secondary | ICD-10-CM | POA: Insufficient documentation

## 2022-03-03 NOTE — Telephone Encounter (Signed)
This nurse spoke with patient and offered appointment for today at 220 pm at the [facility] for [name] of right breast.  No further questions or concerns noted at this time.

## 2022-03-24 ENCOUNTER — Telehealth: Payer: Self-pay

## 2022-03-24 NOTE — Telephone Encounter (Signed)
This nurse received a message from this patient stating that she saw the NP at her last office visit on and she was advised to hold her Anastrazole.  She is wanting to know when she should restart taking it or if there is going to be a change in the medication.  She is also requesting to speak with the provider about another issue that she did not want to state on the voicemail. This nurse attempted to reach the patient however there was no answer and unable to leave a message.   This message was forwarded to the provider.

## 2022-04-06 ENCOUNTER — Ambulatory Visit: Payer: BC Managed Care – PPO | Attending: General Surgery

## 2022-04-06 VITALS — Wt 167.1 lb

## 2022-04-06 DIAGNOSIS — Z483 Aftercare following surgery for neoplasm: Secondary | ICD-10-CM | POA: Insufficient documentation

## 2022-04-06 NOTE — Therapy (Signed)
  OUTPATIENT PHYSICAL THERAPY SOZO SCREENING NOTE   Patient Name: Wanda Castro MRN: [id number] DOB:Oct 28, 1965, 56 y.o., female Today's Date: 04/06/2022  PCP: [name], MD REFERRING PROVIDER: Jovita Kussmaul, MD   PT End of Session - 04/06/22 1015     Visit Number 2   # unchanged due to screen only   PT Start Time 1013    PT Stop Time 1018    PT Time Calculation (min) 5 min    Activity Tolerance Patient tolerated treatment well    Behavior During Therapy [facility] for tasks assessed/performed             Past Medical History:  Diagnosis Date   Anxiety    GERD (gastroesophageal reflux disease)    Heart murmur    HTN (hypertension)    Hypertension    Migraine    Right ureteral stone    Wears contact lenses    Past Surgical History:  Procedure Laterality Date   BREAST BIOPSY Left 08/04/2021   BREAST LUMPECTOMY WITH RADIOACTIVE SEED AND SENTINEL LYMPH NODE BIOPSY Left 09/05/2021   Procedure: LEFT BREAST LUMPECTOMY WITH RADIOACTIVE SEED AND SENTINEL LYMPH NODE BIOPSY;  Surgeon: Jovita Kussmaul, MD;  Location: [facility];  Service: General;  Laterality: Left;   COLONOSCOPY  2015 approx   Patient Active Problem List   Diagnosis Date Noted   Monoallelic mutation of KGYJ8H gene 09/08/2021   Genetic testing 08/25/2021   Family history of colon cancer 08/13/2021   Family history of ovarian cancer 08/13/2021   Malignant neoplasm of upper-outer quadrant of left breast in female, estrogen receptor positive ([place]) 08/11/2021    REFERRING DIAG: left breast cancer at risk for lymphedema  THERAPY DIAG: Aftercare following surgery for neoplasm  PERTINENT HISTORY: Left lumpectomy and SLNB 09/05/21 with 0/3 LN positive. GERD, HTN, migraines  PRECAUTIONS: left UE Lymphedema risk, None  SUBJECTIVE: Pt returns for her 3 month L-Dex screen.   PAIN:  Are you having pain? No  SOZO SCREENING: Patient was assessed today using the SOZO machine to determine the  lymphedema index score. This was compared to her baseline score. It was determined that she is within the recommended range when compared to her baseline and no further action is needed at this time. She will continue SOZO screenings. These are done every 3 months for 2 years post operatively followed by every 6 months for 2 years, and then annually.   L-DEX FLOWSHEETS - 04/06/22 1000       L-DEX LYMPHEDEMA SCREENING   Measurement Type Unilateral    L-DEX MEASUREMENT EXTREMITY Upper Extremity    POSITION  Standing    DOMINANT SIDE Right    At Risk Side Left    BASELINE SCORE (UNILATERAL) -0.5    L-DEX SCORE (UNILATERAL) -1.6    VALUE CHANGE (UNILAT) -1.1              Otelia Limes, PTA 04/06/2022, 10:18 AM

## 2022-04-18 ENCOUNTER — Other Ambulatory Visit: Payer: Self-pay | Admitting: Hematology

## 2022-04-20 NOTE — Telephone Encounter (Signed)
Per last OV note, continue anastrazole. Gardiner Rhyme, RN

## 2022-05-04 ENCOUNTER — Other Ambulatory Visit: Payer: Self-pay | Admitting: *Deleted

## 2022-05-04 DIAGNOSIS — C50412 Malignant neoplasm of upper-outer quadrant of left female breast: Secondary | ICD-10-CM

## 2022-05-04 NOTE — Progress Notes (Signed)
[facility] Follow up:    Wanda Lopes, MD [place] [address]   DIAGNOSIS:  Cancer Staging  Malignant neoplasm of upper-outer quadrant of left breast in female, estrogen receptor positive (Wanda Castro) Staging form: Breast, AJCC 8th Edition - Clinical: Stage IA (cT1a, cN0, cM0, G1, ER+, PR+, HER2: Equivocal) - Unsigned Stage prefix: Initial diagnosis Method of lymph node assessment: Clinical Histologic grading system: 3 grade system - Pathologic stage from 09/05/2021: Stage IA (pT1a, pN0, cM0, G1, ER+, PR+, HER2-) - Signed by Truitt Merle, MD on 11/07/2021 Stage prefix: Initial diagnosis Histologic grading system: 3 grade system Residual tumor (R): R0 - None   SUMMARY OF ONCOLOGIC HISTORY: Oncology History Overview Note   Cancer Staging  Malignant neoplasm of upper-outer quadrant of left breast in female, estrogen receptor positive (Wanda Castro) Staging form: Breast, AJCC 8th Edition - Clinical: Stage IA (cT1a, cN0, cM0, G1, ER+, PR+, HER2: Equivocal) - Unsigned - Pathologic stage from 09/05/2021: Stage IA (pT1a, pN0, cM0, G1, ER+, PR+, HER2-) - Signed by Truitt Merle, MD on 11/07/2021    Malignant neoplasm of upper-outer quadrant of left breast in female, estrogen receptor positive (Wanda Castro)  07/31/2021 Imaging   CLINICAL DATA:  Screening recall for a left breast asymmetry.   EXAM: DIGITAL DIAGNOSTIC UNILATERAL LEFT MAMMOGRAM WITH TOMOSYNTHESIS AND CAD; ULTRASOUND LEFT BREAST LIMITED  IMPRESSION: 1. There is an indeterminate 3 mm mass in the left breast at 2 o'clock.   2.  No evidence of left axillary lymphadenopathy.   08/04/2021 Initial Biopsy   Diagnosis Breast, left, needle core biopsy, UOQ, 2 o'clock, 3cmfn, ribbon clip - INVASIVE DUCTAL CARCINOMA - DUCTAL CARCINOMA IN SITU - SEE COMMENT  Microscopic Comment Based on the biopsy, the carcinoma appears Nottingham grade 1 of 3 and measures 0.5 cm in greatest linear extent.  PROGNOSTIC  INDICATORS Results: The tumor cells are EQUIVOCAL for Her2 (2+). Her2 by FISH will be performed and results reported separately. 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 Of note, a variant of uncertain significance was detected in the RAD51C gene ( p.I52L). Report date is 08/27/2021.  [facility] CustomNext-Cancer+RNAinsight panel offered by [employer] includes sequencing and rearrangement analysis for the following 52 genes:  APC, ATM, AXIN2, BAP1, BARD1, BMPR1A, BRCA1, BRCA2, BRIP1, CDH1, CDK4, CDKN2A, CHEK2, CTNNA1, DICER1, EPCAM, FH, FLCN, GREM1, HOXB13, KIT, MEN1, MET, MITF, MLH1, MSH2, MSH3, MSH6, MUTYH, NBN, NF1, NTHL1, PALB2, PDGFRA, PMS2, POLD1, POLE, PTEN, RAD50, RAD51C, RAD51D, SDHA, SDHB, SDHC, SDHD, SMAD4, SMARCA4, STK11, TP53, TSC1, TSC2, and VHL.  RNA data is routinely analyzed for use in variant interpretation for all genes.    09/05/2021 Cancer Staging   Staging form: Breast, AJCC 8th Edition - Pathologic stage from 09/05/2021: Stage IA (pT1a, pN0, cM0, G1, ER+, PR+, HER2-) - Signed by Truitt Merle, MD on 11/07/2021 Stage prefix: Initial diagnosis Histologic grading system: 3 grade system Residual tumor (R): R0 - None   09/05/2021 Definitive Surgery   FINAL MICROSCOPIC DIAGNOSIS:   A. LYMPH NODE, LEFT AXILLARY #1, SENTINEL, EXCISION:  -  No carcinoma identified in one lymph node (0/1)   B. LYMPH NODE, LEFT AXILLARY, SENTINEL, EXCISION:  -  No carcinoma identified in  one lymph node (0/1)   C. LYMPH NODE, LEFT AXILLARY, SENTINEL, EXCISION:  -  No carcinoma identified in one lymph node (0/1)   D. LYMPH NODE, LEFT AXILLARY, SENTINEL,  EXCISION:  -  No carcinoma identified in one lymph node (0/1)   E. BREAST, LEFT, LUMPECTOMY:  -  Invasive ductal carcinoma, Nottingham grade 1 of 3, 0.3 cm  -  Ductal carcinoma in-situ, low-grade  -  Calcifications associated with carcinoma  -  Margins uninvolved by carcinoma (0.25 cm; superior margin; see part F for final margin status)  -  Previous biopsy site changes present  -  See oncology table and comment below   F. BREAST, LEFT SUPERIOR AND DEEP MARGIN, EXCISION:  -  No residual carcinoma identified   G. BREAST, LEFT LATERAL MARGIN, EXCISION:  -  No residual carcinoma identified    10/13/2021 - 11/07/2021 Radiation Therapy   Site Technique Total Dose (Gy) Dose per Fx (Gy) Completed Fx Beam Energies  Breast, Left: Breast_L 3D 42.56/42.56 2.66 16/16 6XFFF  Breast, Left: Breast_L_Bst 3D 8/8 2 4/4 6X     11/2021 -  Anti-estrogen oral therapy   Anastrozole     CURRENT THERAPY: Anastrozole  INTERVAL HISTORY: Wanda Castro 56 y.o. female returns for follow-up of her history of breast cancer.  She underwent a right breast mammogram in August 2023 for right retroareolar pain this mammogram and ultrasound was negative showing no suspicious abnormalities in the right retroareolar area or along the inframammary fold.  Tranisha restarted anastrozole and is tolerating well.  she is mildly fatigued.   She has bilateral mammo due in december and MRCP and MRI breast bilateral due in 12/2022.  She has no concerns today.    Patient Active Problem List   Diagnosis Date Noted   Heart murmur 56/38/7564   Monoallelic mutation of PPIR5J gene 09/08/2021   Genetic testing 08/25/2021   Family history of colon cancer 08/13/2021   Family history of ovarian cancer 08/13/2021   Malignant neoplasm of upper-outer quadrant of left breast in female, estrogen receptor positive (Wanda Castro) 08/11/2021   Hypertensive disorder 06/21/2019   Migraine 06/21/2019    is allergic to adhesive [tape].  MEDICAL  HISTORY: Past Medical History:  Diagnosis Date   Anxiety    GERD (gastroesophageal reflux disease)    Heart murmur    HTN (hypertension)    Hypertension    Migraine    Right ureteral stone    Wears contact lenses     SURGICAL HISTORY: Past Surgical History:  Procedure Laterality Date   BREAST BIOPSY Left 08/04/2021   BREAST LUMPECTOMY WITH RADIOACTIVE SEED AND SENTINEL LYMPH NODE BIOPSY Left 09/05/2021   Procedure: LEFT BREAST LUMPECTOMY WITH RADIOACTIVE SEED AND SENTINEL LYMPH NODE BIOPSY;  Surgeon: Jovita Kussmaul, MD;  Location: [facility];  Service: General;  Laterality: Left;   COLONOSCOPY  2015 approx    SOCIAL HISTORY: Social History   Socioeconomic History   Marital status: Single    Spouse name: Not on file   Number of children: 0   Years of education: Not on file   Highest education level: Not on file  Occupational History   Occupation: She works in administration in [place] Use   Smoking status: Never   Smokeless tobacco: Never  Substance and Sexual Activity   Alcohol use: No   Drug use: No   Sexual activity: Not on file  Other Topics Concern   Not on file  Social History Narrative   Not on file   Social Determinants of Health   Financial Resource Strain: Not on file  Food Insecurity: Not on file  Transportation Needs: Not on file  Physical Activity: Not on file  Stress: Not on file  Social Connections: Not on file  Intimate Partner Violence: Not on file    FAMILY HISTORY: Family History  Problem Relation Age of Onset   Atrial fibrillation Mother    COPD Mother    Hypertension Father    Renal cancer Father 32   Cancer Maternal Uncle 89       colon cancer   Cancer Paternal Aunt        small cell lung cancer   Prostate cancer Paternal Uncle    Prostate cancer Paternal Uncle    Ovarian cancer Cousin 34       maternal first cousin   Lung cancer Cousin        paternal first cousin   Lung cancer Cousin         paternal first cousin   Uterine cancer Cousin        paternal first cousin, dx. 38s   Ovarian cancer Cousin        dx. 49s   Colon cancer Cousin        paternal first cousin, dx. 69s    Review of Systems  Constitutional:  Negative for appetite change, chills, fatigue, fever and unexpected weight change.  HENT:   Negative for hearing loss, lump/mass and trouble swallowing.   Eyes:  Negative for eye problems and icterus.  Respiratory:  Negative for chest tightness, cough and shortness of breath.   Cardiovascular:  Negative for chest pain, leg swelling and palpitations.  Gastrointestinal:  Negative for abdominal distention, abdominal pain, constipation, diarrhea, nausea and vomiting.  Endocrine: Negative for hot flashes.  Genitourinary:  Negative for difficulty urinating.   Musculoskeletal:  Negative for arthralgias.  Skin:  Negative for itching and rash.  Neurological:  Negative for dizziness, extremity weakness, headaches and numbness.  Hematological:  Negative for adenopathy. Does not bruise/bleed easily.  Psychiatric/Behavioral:  Negative for depression. The patient is not nervous/anxious.       PHYSICAL EXAMINATION  ECOG PERFORMANCE STATUS: 0 - Asymptomatic  Vitals:   05/05/22 1419  BP: 139/80  Pulse: 82  Resp: 18  Temp: 97.9 F (36.6 C)  SpO2: 98%    Physical Exam Constitutional:      General: She is not in acute distress.    Appearance: Normal appearance. She is not toxic-appearing.  HENT:     Head: Normocephalic and atraumatic.  Eyes:     General: No scleral icterus. Cardiovascular:     Rate and Rhythm: Normal rate and regular rhythm.     Pulses: Normal pulses.     Heart sounds: Normal heart sounds.  Pulmonary:     Effort: Pulmonary effort is normal.     Breath sounds: Normal breath sounds.  Chest:     Comments: Left breast status postlumpectomy no sign of local recurrence right breast is benign. Abdominal:     General: Abdomen is flat. Bowel  sounds are normal. There is no distension.     Palpations: Abdomen is soft.     Tenderness: There is no abdominal tenderness.  Musculoskeletal:        General: No swelling.     Cervical back: Neck supple.  Lymphadenopathy:     Cervical: No cervical adenopathy.  Skin:    General: Skin  is warm and dry.     Findings: No rash.  Neurological:     General: No focal deficit present.     Mental Status: She is alert.  Psychiatric:        Mood and Affect: Mood normal.        Behavior: Behavior normal.     LABORATORY DATA:  CBC    Component Value Date/Time   WBC 4.7 05/05/2022 1350   WBC 11.2 (H) 11/28/2015 2326   RBC 4.33 05/05/2022 1350   HGB 13.4 05/05/2022 1350   HCT 39.3 05/05/2022 1350   PLT 216 05/05/2022 1350   MCV 90.8 05/05/2022 1350   MCH 30.9 05/05/2022 1350   MCHC 34.1 05/05/2022 1350   RDW 11.9 05/05/2022 1350   LYMPHSABS 1.6 05/05/2022 1350   MONOABS 0.4 05/05/2022 1350   EOSABS 0.3 05/05/2022 1350   BASOSABS 0.0 05/05/2022 1350    CMP     Component Value Date/Time   NA 141 05/05/2022 1350   K 3.8 05/05/2022 1350   CL 103 05/05/2022 1350   CO2 33 (H) 05/05/2022 1350   GLUCOSE 106 (H) 05/05/2022 1350   BUN 12 05/05/2022 1350   CREATININE 1.00 05/05/2022 1350   CALCIUM 9.8 05/05/2022 1350   PROT 6.8 05/05/2022 1350   ALBUMIN 4.1 05/05/2022 1350   AST 21 05/05/2022 1350   ALT 19 05/05/2022 1350   ALKPHOS 62 05/05/2022 1350   BILITOT 0.4 05/05/2022 1350   GFRNONAA >60 05/05/2022 1350   GFRAA >60 11/28/2015 2326       ASSESSMENT and THERAPY PLAN:   Malignant neoplasm of upper-outer quadrant of left breast in female, estrogen receptor positive (HCC) Wanda Castro is a 56 year old woman with history of stage Ia ER/PR positive left-sided breast cancer diagnosed in January 2023 status postlumpectomy, adjuvant radiation, and antiestrogen therapy with anastrozole that began in May 2023.  Wanda Castro is doing well today.  She has no clinical or radiographic sign  of breast cancer recurrence.  She continues on anastrozole and now is tolerating this medication without difficulty and we recommend that she continue this.  Her bone density testing was completed with Dr. Ophelia Charter office.  I asked my nurse Mickel Baas to obtain these results for [name].  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  Her next mammogram is due in December of this year and it is scheduled.  I recommended healthy diet and exercise.  We will see [name] back in 6 months for follow-up.  Total encounter time:30 minutes*in face-to-face visit time, chart review, lab review, care coordination, order entry, and documentation of the encounter time.    Wilber Bihari, NP 05/05/22 3:40 PM Medical Oncology and Hematology [facility] [place], [place] [address] Tel. [phone]    Fax. [phone]  *Total Encounter Time as defined by the Centers for Medicare and Medicaid Services includes, in addition to the face-to-face time of a patient visit (documented in the note above) non-face-to-face time: obtaining and reviewing outside history, ordering and reviewing medications, tests or procedures, care coordination (communications with other health care professionals or caregivers) and documentation in the medical record.

## 2022-05-05 ENCOUNTER — Inpatient Hospital Stay: Payer: BC Managed Care – PPO

## 2022-05-05 ENCOUNTER — Other Ambulatory Visit: Payer: Self-pay

## 2022-05-05 ENCOUNTER — Encounter: Payer: Self-pay | Admitting: Adult Health

## 2022-05-05 ENCOUNTER — Inpatient Hospital Stay: Payer: BC Managed Care – PPO | Attending: Hematology | Admitting: Adult Health

## 2022-05-05 VITALS — BP 139/80 | HR 82 | Temp 97.9°F | Resp 18 | Ht 62.0 in | Wt 166.0 lb

## 2022-05-05 DIAGNOSIS — C50412 Malignant neoplasm of upper-outer quadrant of left female breast: Secondary | ICD-10-CM | POA: Diagnosis present

## 2022-05-05 DIAGNOSIS — R5383 Other fatigue: Secondary | ICD-10-CM | POA: Insufficient documentation

## 2022-05-05 DIAGNOSIS — Z801 Family history of malignant neoplasm of trachea, bronchus and lung: Secondary | ICD-10-CM | POA: Insufficient documentation

## 2022-05-05 DIAGNOSIS — Z79811 Long term (current) use of aromatase inhibitors: Secondary | ICD-10-CM | POA: Insufficient documentation

## 2022-05-05 DIAGNOSIS — Z8041 Family history of malignant neoplasm of ovary: Secondary | ICD-10-CM | POA: Diagnosis not present

## 2022-05-05 DIAGNOSIS — Z800 Family history of malignant neoplasm of digestive organs: Secondary | ICD-10-CM | POA: Diagnosis not present

## 2022-05-05 DIAGNOSIS — Z170 Estrogen receptor positive status [ER+]: Secondary | ICD-10-CM | POA: Insufficient documentation

## 2022-05-05 DIAGNOSIS — Z8049 Family history of malignant neoplasm of other genital organs: Secondary | ICD-10-CM | POA: Insufficient documentation

## 2022-05-05 DIAGNOSIS — R011 Cardiac murmur, unspecified: Secondary | ICD-10-CM | POA: Insufficient documentation

## 2022-05-05 LAB — CBC WITH DIFFERENTIAL (CANCER CENTER ONLY)
Abs Immature Granulocytes: 0 10*3/uL (ref 0.00–0.07)
Basophils Absolute: 0 10*3/uL (ref 0.0–0.1)
Basophils Relative: 1 %
Eosinophils Absolute: 0.3 10*3/uL (ref 0.0–0.5)
Eosinophils Relative: 7 %
HCT: 39.3 % (ref 36.0–46.0)
Hemoglobin: 13.4 g/dL (ref 12.0–15.0)
Immature Granulocytes: 0 %
Lymphocytes Relative: 33 %
Lymphs Abs: 1.6 10*3/uL (ref 0.7–4.0)
MCH: 30.9 pg (ref 26.0–34.0)
MCHC: 34.1 g/dL (ref 30.0–36.0)
MCV: 90.8 fL (ref 80.0–100.0)
Monocytes Absolute: 0.4 10*3/uL (ref 0.1–1.0)
Monocytes Relative: 8 %
Neutro Abs: 2.4 10*3/uL (ref 1.7–7.7)
Neutrophils Relative %: 51 %
Platelet Count: 216 10*3/uL (ref 150–400)
RBC: 4.33 MIL/uL (ref 3.87–5.11)
RDW: 11.9 % (ref 11.5–15.5)
WBC Count: 4.7 10*3/uL (ref 4.0–10.5)
nRBC: 0 % (ref 0.0–0.2)

## 2022-05-05 LAB — CMP (CANCER CENTER ONLY)
ALT: 19 U/L (ref 0–44)
AST: 21 U/L (ref 15–41)
Albumin: 4.1 g/dL (ref 3.5–5.0)
Alkaline Phosphatase: 62 U/L (ref 38–126)
Anion gap: 5 (ref 5–15)
BUN: 12 mg/dL (ref 6–20)
CO2: 33 mmol/L — ABNORMAL HIGH (ref 22–32)
Calcium: 9.8 mg/dL (ref 8.9–10.3)
Chloride: 103 mmol/L (ref 98–111)
Creatinine: 1 mg/dL (ref 0.44–1.00)
GFR, Estimated: >60 mL/min (ref >60)
Glucose, Bld: 106 mg/dL — ABNORMAL HIGH (ref 70–99)
Potassium: 3.8 mmol/L (ref 3.5–5.1)
Sodium: 141 mmol/L (ref 135–145)
Total Bilirubin: 0.4 mg/dL (ref 0.3–1.2)
Total Protein: 6.8 g/dL (ref 6.5–8.1)

## 2022-05-05 NOTE — Assessment & Plan Note (Addendum)
Wanda Castro is a 55 year old woman with history of stage Ia ER/PR positive left-sided breast cancer diagnosed in January 2023 status postlumpectomy, adjuvant radiation, and antiestrogen therapy with anastrozole that began in May 2023.  [name] is doing well today.  She has no clinical or radiographic sign of breast cancer recurrence.  She continues on anastrozole and now is tolerating this medication without difficulty and we recommend that she continue this.  Her bone density testing was completed with Dr. Ophelia Charter office.  I asked my nurse Mickel Baas to obtain these results for [name].  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  Her next mammogram is due in December of this year and it is scheduled.  I recommended healthy diet and exercise.  We will see [name] back in 6 months for follow-up.

## 2022-06-22 ENCOUNTER — Ambulatory Visit
Admission: RE | Admit: 2022-06-22 | Discharge: 2022-06-22 | Disposition: A | Discharge status: Home / Self Care | Payer: BC Managed Care – PPO | Source: Ambulatory Visit | Attending: Adult Health | Admitting: Adult Health

## 2022-06-22 DIAGNOSIS — Z170 Estrogen receptor positive status [ER+]: Secondary | ICD-10-CM

## 2022-06-22 HISTORY — DX: Personal history of irradiation: Z92.3

## 2022-06-29 ENCOUNTER — Ambulatory Visit: Payer: BC Managed Care – PPO | Attending: General Surgery

## 2022-06-29 VITALS — Wt 160.2 lb

## 2022-06-29 DIAGNOSIS — Z483 Aftercare following surgery for neoplasm: Secondary | ICD-10-CM | POA: Insufficient documentation

## 2022-06-29 NOTE — Therapy (Signed)
  OUTPATIENT PHYSICAL THERAPY [facility] SCREENING NOTE   Patient Name: Wanda Castro MRN: [id number] DOB:01/07/1966, 56 y.o., female Today's Date: 06/29/2022  PCP: [name], MD REFERRING PROVIDER: Autumn Messing III, MD   PT End of Session - 06/29/22 1648     Visit Number 2   # ucnhanged due to screen only   PT Start Time 1194    PT Stop Time 1649    PT Time Calculation (min) 5 min    Activity Tolerance Patient tolerated treatment well    Behavior During Therapy [facility] for tasks assessed/performed             Past Medical History:  Diagnosis Date   Anxiety    GERD (gastroesophageal reflux disease)    Heart murmur    HTN (hypertension)    Hypertension    Migraine    Personal history of radiation therapy    Right ureteral stone    Wears contact lenses    Past Surgical History:  Procedure Laterality Date   BREAST BIOPSY Left 08/04/2021   BREAST LUMPECTOMY Left 09/05/2021   BREAST LUMPECTOMY WITH RADIOACTIVE SEED AND SENTINEL LYMPH NODE BIOPSY Left 09/05/2021   Procedure: LEFT BREAST LUMPECTOMY WITH RADIOACTIVE SEED AND SENTINEL LYMPH NODE BIOPSY;  Surgeon: Jovita Kussmaul, MD;  Location: [facility];  Service: General;  Laterality: Left;   COLONOSCOPY  2015 approx   Patient Active Problem List   Diagnosis Date Noted   Heart murmur 17/40/8144   Monoallelic mutation of YJEH6D gene 09/08/2021   Genetic testing 08/25/2021   Family history of colon cancer 08/13/2021   Family history of ovarian cancer 08/13/2021   Malignant neoplasm of upper-outer quadrant of left breast in female, estrogen receptor positive ([place]) 08/11/2021   Hypertensive disorder 06/21/2019   Migraine 06/21/2019    REFERRING DIAG: left breast cancer at risk for lymphedema  THERAPY DIAG: Aftercare following surgery for neoplasm  PERTINENT HISTORY: Left lumpectomy and SLNB 09/05/21 with 0/3 LN positive. GERD, HTN, migraines  PRECAUTIONS: left UE Lymphedema risk, None  SUBJECTIVE: Pt  returns for her 3 month L-Dex screen.   PAIN:  Are you having pain? No  SOZO SCREENING: Patient was assessed today using the SOZO machine to determine the lymphedema index score. This was compared to her baseline score. It was determined that she is within the recommended range when compared to her baseline and no further action is needed at this time. She will continue SOZO screenings. These are done every 3 months for 2 years post operatively followed by every 6 months for 2 years, and then annually.   L-DEX FLOWSHEETS - 06/29/22 1600       L-DEX LYMPHEDEMA SCREENING   Measurement Type Unilateral    L-DEX MEASUREMENT EXTREMITY Upper Extremity    POSITION  Standing    DOMINANT SIDE Right    At Risk Side Left    BASELINE SCORE (UNILATERAL) -0.5    L-DEX SCORE (UNILATERAL) -1.5    VALUE CHANGE (UNILAT) -1              Otelia Limes, PTA 06/29/2022, 4:49 PM

## 2022-07-07 ENCOUNTER — Encounter: Payer: Self-pay | Admitting: Gastroenterology

## 2022-07-28 ENCOUNTER — Telehealth: Payer: Self-pay

## 2022-07-28 NOTE — Telephone Encounter (Signed)
Pt called stating her last f/u has been with Wilber Bihari, NP.  Pt stated she completed her mammogram in December 2023 and the results of that mammogram were negative (good).  Pt stated she was doing her regular breast exams at home and noticed a lump about the size of a rice grain in her left breast.  Pt stated she has never felt it before and is concerned.  Informed pt that I would notify Dr. Burr Medico of her call and concern.

## 2022-08-03 ENCOUNTER — Encounter: Payer: Self-pay | Admitting: Adult Health

## 2022-08-03 ENCOUNTER — Inpatient Hospital Stay: Payer: BC Managed Care – PPO | Admitting: Adult Health

## 2022-08-03 ENCOUNTER — Ambulatory Visit
Admission: RE | Admit: 2022-08-03 | Discharge: 2022-08-03 | Disposition: A | Discharge status: Home / Self Care | Payer: BC Managed Care – PPO | Source: Ambulatory Visit | Attending: Adult Health | Admitting: Adult Health

## 2022-08-03 ENCOUNTER — Telehealth: Payer: Self-pay

## 2022-08-03 ENCOUNTER — Other Ambulatory Visit: Payer: Self-pay

## 2022-08-03 ENCOUNTER — Inpatient Hospital Stay: Payer: BC Managed Care – PPO | Attending: Adult Health | Admitting: Adult Health

## 2022-08-03 VITALS — BP 135/80 | HR 81 | Temp 97.7°F | Resp 18 | Ht 62.0 in | Wt 159.1 lb

## 2022-08-03 DIAGNOSIS — Z170 Estrogen receptor positive status [ER+]: Secondary | ICD-10-CM | POA: Diagnosis not present

## 2022-08-03 DIAGNOSIS — C50412 Malignant neoplasm of upper-outer quadrant of left female breast: Secondary | ICD-10-CM | POA: Diagnosis not present

## 2022-08-03 DIAGNOSIS — Z79811 Long term (current) use of aromatase inhibitors: Secondary | ICD-10-CM | POA: Insufficient documentation

## 2022-08-03 NOTE — Progress Notes (Unsigned)
[facility] Follow up:    [name], MD [place] [address]   DIAGNOSIS: Cancer Staging  Malignant neoplasm of upper-outer quadrant of left breast in female, estrogen receptor positive ([place]) Staging form: Breast, AJCC 8th Edition - Clinical: Stage IA (cT1a, cN0, cM0, G1, ER+, PR+, HER2: Equivocal) - Unsigned Stage prefix: Initial diagnosis Method of lymph node assessment: Clinical Histologic grading system: 3 grade system - Pathologic stage from 09/05/2021: Stage IA (pT1a, pN0, cM0, G1, ER+, PR+, HER2-) - Signed by Truitt Merle, MD on 11/07/2021 Stage prefix: Initial diagnosis Histologic grading system: 3 grade system Residual tumor (R): R0 - None   SUMMARY OF ONCOLOGIC HISTORY: Oncology History Overview Note   Cancer Staging  Malignant neoplasm of upper-outer quadrant of left breast in female, estrogen receptor positive ([place]) Staging form: Breast, AJCC 8th Edition - Clinical: Stage IA (cT1a, cN0, cM0, G1, ER+, PR+, HER2: Equivocal) - Unsigned - Pathologic stage from 09/05/2021: Stage IA (pT1a, pN0, cM0, G1, ER+, PR+, HER2-) - Signed by Truitt Merle, MD on 11/07/2021    Malignant neoplasm of upper-outer quadrant of left breast in female, estrogen receptor positive ([place])  07/31/2021 Imaging   CLINICAL DATA:  Screening recall for a left breast asymmetry.   EXAM: DIGITAL DIAGNOSTIC UNILATERAL LEFT MAMMOGRAM WITH TOMOSYNTHESIS AND CAD; ULTRASOUND LEFT BREAST LIMITED  IMPRESSION: 1. There is an indeterminate 3 mm mass in the left breast at 2 o'clock.   2.  No evidence of left axillary lymphadenopathy.   08/04/2021 Initial Biopsy   Diagnosis Breast, left, needle core biopsy, UOQ, 2 o'clock, 3cmfn, ribbon clip - INVASIVE DUCTAL CARCINOMA - DUCTAL CARCINOMA IN SITU - SEE COMMENT  Microscopic Comment Based on the biopsy, the carcinoma appears Nottingham grade 1 of 3 and measures 0.5 cm in greatest linear extent.  PROGNOSTIC  INDICATORS Results: The tumor cells are EQUIVOCAL for Her2 (2+). Her2 by FISH will be performed and results reported separately. 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 Of note, a variant of uncertain significance was detected in the RAD51C gene ( p.I52L). Report date is 08/27/2021.  [facility] CustomNext-Cancer+RNAinsight panel offered by [employer] includes sequencing and rearrangement analysis for the following 52 genes:  APC, ATM, AXIN2, BAP1, BARD1, BMPR1A, BRCA1, BRCA2, BRIP1, CDH1, CDK4, CDKN2A, CHEK2, CTNNA1, DICER1, EPCAM, FH, FLCN, GREM1, HOXB13, KIT, MEN1, MET, MITF, MLH1, MSH2, MSH3, MSH6, MUTYH, NBN, NF1, NTHL1, PALB2, PDGFRA, PMS2, POLD1, POLE, PTEN, RAD50, RAD51C, RAD51D, SDHA, SDHB, SDHC, SDHD, SMAD4, SMARCA4, STK11, TP53, TSC1, TSC2, and VHL.  RNA data is routinely analyzed for use in variant interpretation for all genes.    09/05/2021 Cancer Staging   Staging form: Breast, AJCC 8th Edition - Pathologic stage from 09/05/2021: Stage IA (pT1a, pN0, cM0, G1, ER+, PR+, HER2-) - Signed by Truitt Merle, MD on 11/07/2021 Stage prefix: Initial diagnosis Histologic grading system: 3 grade system Residual tumor (R): R0 - None   09/05/2021 Definitive Surgery   FINAL MICROSCOPIC DIAGNOSIS:   A. LYMPH NODE, LEFT AXILLARY #1, SENTINEL, EXCISION:  -  No carcinoma identified in one lymph node (0/1)   B. LYMPH NODE, LEFT AXILLARY, SENTINEL, EXCISION:  -  No carcinoma identified in one  lymph node (0/1)   C. LYMPH NODE, LEFT AXILLARY, SENTINEL, EXCISION:  -  No carcinoma identified in one lymph node (0/1)   D. LYMPH NODE, LEFT AXILLARY, SENTINEL,  EXCISION:  -  No carcinoma identified in one lymph node (0/1)   E. BREAST, LEFT, LUMPECTOMY:  -  Invasive ductal carcinoma, Nottingham grade 1 of 3, 0.3 cm  -  Ductal carcinoma in-situ, low-grade  -  Calcifications associated with carcinoma  -  Margins uninvolved by carcinoma (0.25 cm; superior margin; see part F for final margin status)  -  Previous biopsy site changes present  -  See oncology table and comment below   F. BREAST, LEFT SUPERIOR AND DEEP MARGIN, EXCISION:  -  No residual carcinoma identified   G. BREAST, LEFT LATERAL MARGIN, EXCISION:  -  No residual carcinoma identified    10/13/2021 - 11/07/2021 Radiation Therapy   Site Technique Total Dose (Gy) Dose per Fx (Gy) Completed Fx Beam Energies  Breast, Left: Breast_L 3D 42.56/42.56 2.66 16/16 6XFFF  Breast, Left: Breast_L_Bst 3D 8/8 2 4/4 6X     11/2021 -  Anti-estrogen oral therapy   Anastrozole     CURRENT THERAPY: Anastrozole  INTERVAL HISTORY: Wanda Castro 57 y.o. female returns for follow-up of her estrogen positive breast cancer.  Her most recent mammogram occurred on June 22, 2022 demonstrating no mammographic evidence of malignancy and breast density category B.   Patient Active Problem List   Diagnosis Date Noted   Heart murmur 52/84/1324   Monoallelic mutation of MWNU2V gene 09/08/2021   Genetic testing 08/25/2021   Family history of colon cancer 08/13/2021   Family history of ovarian cancer 08/13/2021   Malignant neoplasm of upper-outer quadrant of left breast in female, estrogen receptor positive ([place]) 08/11/2021   Hypertensive disorder 06/21/2019   Migraine 06/21/2019    is allergic to adhesive [tape].  MEDICAL HISTORY: Past Medical History:  Diagnosis Date   Anxiety    GERD (gastroesophageal reflux disease)    Heart murmur    HTN (hypertension)    Hypertension    Migraine    Personal history of radiation therapy    Right ureteral stone    Wears contact lenses     SURGICAL  HISTORY: Past Surgical History:  Procedure Laterality Date   BREAST BIOPSY Left 08/04/2021   BREAST LUMPECTOMY Left 09/05/2021   BREAST LUMPECTOMY WITH RADIOACTIVE SEED AND SENTINEL LYMPH NODE BIOPSY Left 09/05/2021   Procedure: LEFT BREAST LUMPECTOMY WITH RADIOACTIVE SEED AND SENTINEL LYMPH NODE BIOPSY;  Surgeon: Jovita Kussmaul, MD;  Location: [facility];  Service: General;  Laterality: Left;   COLONOSCOPY  2015 approx    SOCIAL HISTORY: Social History   Socioeconomic History   Marital status: Single    Spouse name: Not on file   Number of children: 0   Years of education: Not on file   Highest education level: Not on file  Occupational History   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   Smoking status: Never   Smokeless tobacco: Never  Substance and Sexual Activity   Alcohol use: No   Drug use: No   Sexual activity: Not on file  Other Topics Concern   Not on file  Social History Narrative   Not on file   Social Determinants of Health   Financial Resource Strain: Not on file  Food Insecurity: Not on file  Transportation Needs: Not on file  Physical Activity:  Not on file  Stress: Not on file  Social Connections: Not on file  Intimate Partner Violence: Not on file    FAMILY HISTORY: Family History  Problem Relation Age of Onset   Atrial fibrillation Mother    COPD Mother    Hypertension Father    Renal cancer Father 16   Cancer Maternal Uncle 34       colon cancer   Cancer Paternal Aunt        small cell lung cancer   Prostate cancer Paternal Uncle    Prostate cancer Paternal Uncle    Ovarian cancer Cousin 22       maternal first cousin   Lung cancer Cousin        paternal first cousin   Lung cancer Cousin        paternal first cousin   Uterine cancer Cousin        paternal first cousin, dx. 30s   Ovarian cancer Cousin        dx. 50s   Colon cancer Cousin        paternal first cousin, dx. 69s     Review of Systems - Oncology    PHYSICAL EXAMINATION  ECOG PERFORMANCE STATUS: {CHL ONC ECOG VO:[id number]}  There were no vitals filed for this visit.  Physical Exam  LABORATORY DATA:  CBC    Component Value Date/Time   WBC 4.7 05/05/2022 1350   WBC 11.2 (H) 11/28/2015 2326   RBC 4.33 05/05/2022 1350   HGB 13.4 05/05/2022 1350   HCT 39.3 05/05/2022 1350   PLT 216 05/05/2022 1350   MCV 90.8 05/05/2022 1350   MCH 30.9 05/05/2022 1350   MCHC 34.1 05/05/2022 1350   RDW 11.9 05/05/2022 1350   LYMPHSABS 1.6 05/05/2022 1350   MONOABS 0.4 05/05/2022 1350   EOSABS 0.3 05/05/2022 1350   BASOSABS 0.0 05/05/2022 1350    CMP     Component Value Date/Time   NA 141 05/05/2022 1350   K 3.8 05/05/2022 1350   CL 103 05/05/2022 1350   CO2 33 (H) 05/05/2022 1350   GLUCOSE 106 (H) 05/05/2022 1350   BUN 12 05/05/2022 1350   CREATININE 1.00 05/05/2022 1350   CALCIUM 9.8 05/05/2022 1350   PROT 6.8 05/05/2022 1350   ALBUMIN 4.1 05/05/2022 1350   AST 21 05/05/2022 1350   ALT 19 05/05/2022 1350   ALKPHOS 62 05/05/2022 1350   BILITOT 0.4 05/05/2022 1350   GFRNONAA >60 05/05/2022 1350   GFRAA >60 11/28/2015 2326       PENDING LABS:   RADIOGRAPHIC STUDIES:  No results found.   PATHOLOGY:     ASSESSMENT and THERAPY PLAN:   No problem-specific Assessment & Plan notes found for this encounter.   No orders of the defined types were placed in this encounter.   All questions were answered. The patient knows to call the clinic with any problems, questions or concerns. We can certainly see the patient much sooner if necessary. This note was electronically signed. Scot Dock, NP 08/03/2022

## 2022-08-03 NOTE — Assessment & Plan Note (Signed)
Wanda Castro is a 57 year old woman with history of stage Ia ER/PR positive left-sided breast cancer diagnosed in January 2023 status post lumpectomy followed by adjuvant radiation and antiestrogen therapy with anastrozole.  She continues on anastrozole however is here today for urgent evaluation of a left breast nodule that she felt recently.  Although she had a mammogram several weeks ago we can clearly palpate a very small nodule in the subareolar.  I have placed orders for further evaluation with mammogram and ultrasound.  We will follow-up with her pending those results.  She also has scheduled follow-up with [name] in April 2024.

## 2022-08-03 NOTE — Progress Notes (Signed)
[facility] Follow up:    [name], MD [place] [address]   DIAGNOSIS:  Cancer Staging  Malignant neoplasm of upper-outer quadrant of left breast in female, estrogen receptor positive ([name]) Staging form: Breast, AJCC 8th Edition - Clinical: Stage IA (cT1a, cN0, cM0, G1, ER+, PR+, HER2: Equivocal) - Unsigned Stage prefix: Initial diagnosis Method of lymph node assessment: Clinical Histologic grading system: 3 grade system - Pathologic stage from 09/05/2021: Stage IA (pT1a, pN0, cM0, G1, ER+, PR+, HER2-) - Signed by Truitt Merle, MD on 11/07/2021 Stage prefix: Initial diagnosis Histologic grading system: 3 grade system Residual tumor (R): R0 - None   SUMMARY OF ONCOLOGIC HISTORY: Oncology History Overview Note   Cancer Staging  Malignant neoplasm of upper-outer quadrant of left breast in female, estrogen receptor positive ([name]) Staging form: Breast, AJCC 8th Edition - Clinical: Stage IA (cT1a, cN0, cM0, G1, ER+, PR+, HER2: Equivocal) - Unsigned - Pathologic stage from 09/05/2021: Stage IA (pT1a, pN0, cM0, G1, ER+, PR+, HER2-) - Signed by Truitt Merle, MD on 11/07/2021    Malignant neoplasm of upper-outer quadrant of left breast in female, estrogen receptor positive ([name])  07/31/2021 Imaging   CLINICAL DATA:  Screening recall for a left breast asymmetry.   EXAM: DIGITAL DIAGNOSTIC UNILATERAL LEFT MAMMOGRAM WITH TOMOSYNTHESIS AND CAD; ULTRASOUND LEFT BREAST LIMITED  IMPRESSION: 1. There is an indeterminate 3 mm mass in the left breast at 2 o'clock.   2.  No evidence of left axillary lymphadenopathy.   08/04/2021 Initial Biopsy   Diagnosis Breast, left, needle core biopsy, UOQ, 2 o'clock, 3cmfn, ribbon clip - INVASIVE DUCTAL CARCINOMA - DUCTAL CARCINOMA IN SITU - SEE COMMENT  Microscopic Comment Based on the biopsy, the carcinoma appears Nottingham grade 1 of 3 and measures 0.5 cm in greatest linear extent.  PROGNOSTIC  INDICATORS Results: The tumor cells are EQUIVOCAL for Her2 (2+). Her2 by FISH will be performed and results reported separately. 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 Of note, a variant of uncertain significance was detected in the RAD51C gene ( p.I52L). Report date is 08/27/2021.  [facility] CustomNext-Cancer+RNAinsight panel offered by [employer] includes sequencing and rearrangement analysis for the following 52 genes:  APC, ATM, AXIN2, BAP1, BARD1, BMPR1A, BRCA1, BRCA2, BRIP1, CDH1, CDK4, CDKN2A, CHEK2, CTNNA1, DICER1, EPCAM, FH, FLCN, GREM1, HOXB13, KIT, MEN1, MET, MITF, MLH1, MSH2, MSH3, MSH6, MUTYH, NBN, NF1, NTHL1, PALB2, PDGFRA, PMS2, POLD1, POLE, PTEN, RAD50, RAD51C, RAD51D, SDHA, SDHB, SDHC, SDHD, SMAD4, SMARCA4, STK11, TP53, TSC1, TSC2, and VHL.  RNA data is routinely analyzed for use in variant interpretation for all genes.    09/05/2021 Cancer Staging   Staging form: Breast, AJCC 8th Edition - Pathologic stage from 09/05/2021: Stage IA (pT1a, pN0, cM0, G1, ER+, PR+, HER2-) - Signed by Truitt Merle, MD on 11/07/2021 Stage prefix: Initial diagnosis Histologic grading system: 3 grade system Residual tumor (R): R0 - None   09/05/2021 Definitive Surgery   FINAL MICROSCOPIC DIAGNOSIS:   A. LYMPH NODE, LEFT AXILLARY #1, SENTINEL, EXCISION:  -  No carcinoma identified in one lymph node (0/1)   B. LYMPH NODE, LEFT AXILLARY, SENTINEL, EXCISION:  -  No carcinoma identified in  one lymph node (0/1)   C. LYMPH NODE, LEFT AXILLARY, SENTINEL, EXCISION:  -  No carcinoma identified in one lymph node (0/1)   D. LYMPH NODE, LEFT AXILLARY, SENTINEL,  EXCISION:  -  No carcinoma identified in one lymph node (0/1)   E. BREAST, LEFT, LUMPECTOMY:  -  Invasive ductal carcinoma, Nottingham grade 1 of 3, 0.3 cm  -  Ductal carcinoma in-situ, low-grade  -  Calcifications associated with carcinoma  -  Margins uninvolved by carcinoma (0.25 cm; superior margin; see part F for final margin status)  -  Previous biopsy site changes present  -  See oncology table and comment below   F. BREAST, LEFT SUPERIOR AND DEEP MARGIN, EXCISION:  -  No residual carcinoma identified   G. BREAST, LEFT LATERAL MARGIN, EXCISION:  -  No residual carcinoma identified    10/13/2021 - 11/07/2021 Radiation Therapy   Site Technique Total Dose (Gy) Dose per Fx (Gy) Completed Fx Beam Energies  Breast, Left: Breast_L 3D 42.56/42.56 2.66 16/16 6XFFF  Breast, Left: Breast_L_Bst 3D 8/8 2 4/4 6X     11/2021 -  Anti-estrogen oral therapy   Anastrozole     CURRENT THERAPY: Anastrozole  INTERVAL HISTORY: Wanda Castro 57 y.o. female returns for follow-up of her history of estrogen positive breast cancer for urgent evaluation after palpating a breast lump when she was doing a self breast exam over the weekend.  About 5 to 6 weeks ago she underwent bilateral breast mammogram which demonstrated no mammographic evidence of malignancy and breast density category B.   Patient Active Problem List   Diagnosis Date Noted   Heart murmur 01/06/9484   Monoallelic mutation of IOEV0J gene 09/08/2021   Genetic testing 08/25/2021   Family history of colon cancer 08/13/2021   Family history of ovarian cancer 08/13/2021   Malignant neoplasm of upper-outer quadrant of left breast in female, estrogen receptor positive ([place]) 08/11/2021   Hypertensive disorder 06/21/2019   Migraine 06/21/2019    is allergic to adhesive [tape].  MEDICAL HISTORY: Past Medical History:  Diagnosis Date   Anxiety    GERD (gastroesophageal reflux disease)    Heart murmur    HTN (hypertension)     Hypertension    Migraine    Personal history of radiation therapy    Right ureteral stone    Wears contact lenses     SURGICAL HISTORY: Past Surgical History:  Procedure Laterality Date   BREAST BIOPSY Left 08/04/2021   BREAST LUMPECTOMY Left 09/05/2021   BREAST LUMPECTOMY WITH RADIOACTIVE SEED AND SENTINEL LYMPH NODE BIOPSY Left 09/05/2021   Procedure: LEFT BREAST LUMPECTOMY WITH RADIOACTIVE SEED AND SENTINEL LYMPH NODE BIOPSY;  Surgeon: Jovita Kussmaul, MD;  Location: [facility];  Service: General;  Laterality: Left;   COLONOSCOPY  2015 approx    SOCIAL HISTORY: Social History   Socioeconomic History   Marital status: Single    Spouse name: Not on file   Number of children: 0   Years of education: Not on file   Highest education level: Not on file  Occupational History   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   Smoking status: Never   Smokeless tobacco: Never  Substance and Sexual Activity   Alcohol use: No   Drug use: No   Sexual activity: Not on file  Other Topics Concern   Not on file  Social History Narrative   Not on file   Social Determinants  of Health   Financial Resource Strain: Not on file  Food Insecurity: Not on file  Transportation Needs: Not on file  Physical Activity: Not on file  Stress: Not on file  Social Connections: Not on file  Intimate Partner Violence: Not on file    FAMILY HISTORY: Family History  Problem Relation Age of Onset   Atrial fibrillation Mother    COPD Mother    Hypertension Father    Renal cancer Father 41   Cancer Maternal Uncle 18       colon cancer   Cancer Paternal Aunt        small cell lung cancer   Prostate cancer Paternal Uncle    Prostate cancer Paternal Uncle    Ovarian cancer Cousin 46       maternal first cousin   Lung cancer Cousin        paternal first cousin   Lung cancer Cousin        paternal first cousin   Uterine cancer Cousin         paternal first cousin, dx. 63s   Ovarian cancer Cousin        dx. 66s   Colon cancer Cousin        paternal first cousin, dx. 54s    Review of Systems  Constitutional:  Negative for appetite change, chills, fatigue, fever and unexpected weight change.  HENT:   Negative for hearing loss, lump/mass and trouble swallowing.   Eyes:  Negative for eye problems and icterus.  Respiratory:  Negative for chest tightness, cough and shortness of breath.   Cardiovascular:  Negative for chest pain, leg swelling and palpitations.  Gastrointestinal:  Negative for abdominal distention, abdominal pain, constipation, diarrhea, nausea and vomiting.  Endocrine: Negative for hot flashes.  Genitourinary:  Negative for difficulty urinating.   Musculoskeletal:  Negative for arthralgias.  Skin:  Negative for itching and rash.  Neurological:  Negative for dizziness, extremity weakness, headaches and numbness.  Hematological:  Negative for adenopathy. Does not bruise/bleed easily.  Psychiatric/Behavioral:  Negative for depression. The patient is not nervous/anxious.       PHYSICAL EXAMINATION  ECOG PERFORMANCE STATUS: 1 - Symptomatic but completely ambulatory  135/80, HR 81, 97.7, 99%, 159 lbs  Physical Exam Constitutional:      Appearance: Normal appearance. She is not toxic-appearing.  HENT:     Head: Normocephalic and atraumatic.  Eyes:     General: No scleral icterus. Chest:     Comments: Left breast with a small pinpoint subareolar nodule that is well-circumscribed. Skin:    General: Skin is warm and dry.  Neurological:     Mental Status: She is alert.  Psychiatric:        Mood and Affect: Mood normal.        Behavior: Behavior normal.     LABORATORY DATA: None for this visit    ASSESSMENT and THERAPY PLAN:   Malignant neoplasm of upper-outer quadrant of left breast in female, estrogen receptor positive ([place]) Wanda Castro is a 57 year old woman with history of stage Ia ER/PR positive  left-sided breast cancer diagnosed in January 2023 status post lumpectomy followed by adjuvant radiation and antiestrogen therapy with anastrozole.  She continues on anastrozole however is here today for urgent evaluation of a left breast nodule that she felt recently.  Although she had a mammogram several weeks ago we can clearly palpate a very small nodule in the subareolar.  I have placed orders for further evaluation with mammogram and  ultrasound.  We will follow-up with her pending those results.  She also has scheduled follow-up with [name] in April 2024.     All questions were answered. The patient knows to call the clinic with any problems, questions or concerns. We can certainly see the patient much sooner if necessary.  Total encounter time:20 minutes*in face-to-face visit time, chart review, lab review, care coordination, order entry, and documentation of the encounter time.    Wilber Bihari, NP 08/03/22 9:01 AM Medical Oncology and Hematology [facility] [place], [place] [address] Tel. [phone]    Fax. [phone]  *Total Encounter Time as defined by the Centers for Medicare and Medicaid Services includes, in addition to the face-to-face time of a patient visit (documented in the note above) non-face-to-face time: obtaining and reviewing outside history, ordering and reviewing medications, tests or procedures, care coordination (communications with other health care professionals or caregivers) and documentation in the medical record.

## 2022-08-03 NOTE — Telephone Encounter (Signed)
Called and given appt at 1:50 pm today for mammogram /ultrasound at the breast center. Arrive at 1:30 pm. She verbalized understanding to appt.Wanda Castro

## 2022-08-06 NOTE — Progress Notes (Signed)
error 

## 2022-08-23 ENCOUNTER — Other Ambulatory Visit: Payer: Self-pay | Admitting: Hematology

## 2022-08-29 ENCOUNTER — Ambulatory Visit
Admission: RE | Admit: 2022-08-29 | Discharge: 2022-08-29 | Disposition: A | Discharge status: Home / Self Care | Payer: BC Managed Care – PPO | Source: Ambulatory Visit | Attending: Adult Health | Admitting: Adult Health

## 2022-08-29 DIAGNOSIS — Z1509 Genetic susceptibility to other malignant neoplasm: Secondary | ICD-10-CM

## 2022-08-29 DIAGNOSIS — C50412 Malignant neoplasm of upper-outer quadrant of left female breast: Secondary | ICD-10-CM

## 2022-08-29 MED ORDER — GADOPICLENOL 0.5 MMOL/ML IV SOLN
7.0000 mL | Freq: Once | INTRAVENOUS | Status: AC | PRN
  Administered 2022-08-29: 6 mL via INTRAVENOUS

## 2022-10-05 ENCOUNTER — Ambulatory Visit: Payer: BC Managed Care – PPO | Attending: General Surgery

## 2022-10-05 VITALS — Wt 160.1 lb

## 2022-10-05 DIAGNOSIS — Z483 Aftercare following surgery for neoplasm: Secondary | ICD-10-CM | POA: Insufficient documentation

## 2022-10-05 NOTE — Therapy (Signed)
  OUTPATIENT PHYSICAL THERAPY SOZO SCREENING NOTE   Patient Name: Wanda Castro MRN: [place]:[id number] DOB:12-25-1965, 57 y.o., female Today's Date: 10/05/2022  PCP: [name], MD REFERRING PROVIDER: Jovita Kussmaul, MD   PT End of Session - 10/05/22 1619     Visit Number 2   # unchanged due to screen only   PT Start Time 1618    PT Stop Time 1622    PT Time Calculation (min) 4 min    Activity Tolerance Patient tolerated treatment well    Behavior During Therapy [facility] for tasks assessed/performed             Past Medical History:  Diagnosis Date   Anxiety    GERD (gastroesophageal reflux disease)    Heart murmur    HTN (hypertension)    Hypertension    Migraine    Personal history of radiation therapy    Right ureteral stone    Wears contact lenses    Past Surgical History:  Procedure Laterality Date   BREAST BIOPSY Left 08/04/2021   BREAST LUMPECTOMY Left 09/05/2021   BREAST LUMPECTOMY WITH RADIOACTIVE SEED AND SENTINEL LYMPH NODE BIOPSY Left 09/05/2021   Procedure: LEFT BREAST LUMPECTOMY WITH RADIOACTIVE SEED AND SENTINEL LYMPH NODE BIOPSY;  Surgeon: Jovita Kussmaul, MD;  Location: [facility];  Service: General;  Laterality: Left;   COLONOSCOPY  2015 approx   Patient Active Problem List   Diagnosis Date Noted   Heart murmur 123456   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 GERD, HTN, migraines  PRECAUTIONS: left UE Lymphedema risk, None  SUBJECTIVE: Pt  returns for her 3 month L-Dex screen.   PAIN:  Are you having pain? No  SOZO SCREENING: Patient was assessed today using the SOZO machine to determine the lymphedema index score. This was compared to her baseline score. It was determined that she is within the recommended range when compared to her baseline and no further action is needed at this time. She will continue SOZO screenings. These are done every 3 months for 2 years post operatively followed by every 6 months for 2 years, and then annually.   L-DEX FLOWSHEETS - 10/05/22 1600       L-DEX LYMPHEDEMA SCREENING   Measurement Type Unilateral    L-DEX MEASUREMENT EXTREMITY Upper Extremity    POSITION  Standing    DOMINANT SIDE Right    At Risk Side Left    BASELINE SCORE (UNILATERAL) -0.5    L-DEX SCORE (UNILATERAL) -0.9    VALUE CHANGE (UNILAT) -0.4              Otelia Limes, PTA 10/05/2022, 4:23 PM

## 2022-11-04 ENCOUNTER — Other Ambulatory Visit: Payer: Self-pay

## 2022-11-04 DIAGNOSIS — Z170 Estrogen receptor positive status [ER+]: Secondary | ICD-10-CM

## 2022-11-05 ENCOUNTER — Encounter: Payer: Self-pay | Admitting: Adult Health

## 2022-11-05 ENCOUNTER — Inpatient Hospital Stay: Payer: BC Managed Care – PPO | Attending: Adult Health | Admitting: Adult Health

## 2022-11-05 ENCOUNTER — Inpatient Hospital Stay: Payer: BC Managed Care – PPO

## 2022-11-05 VITALS — BP 118/54 | HR 83 | Temp 98.1°F | Resp 18 | Ht 62.0 in | Wt 158.8 lb

## 2022-11-05 DIAGNOSIS — C50412 Malignant neoplasm of upper-outer quadrant of left female breast: Secondary | ICD-10-CM

## 2022-11-05 DIAGNOSIS — Z8051 Family history of malignant neoplasm of kidney: Secondary | ICD-10-CM | POA: Diagnosis not present

## 2022-11-05 DIAGNOSIS — M25522 Pain in left elbow: Secondary | ICD-10-CM

## 2022-11-05 DIAGNOSIS — Z8041 Family history of malignant neoplasm of ovary: Secondary | ICD-10-CM | POA: Diagnosis not present

## 2022-11-05 DIAGNOSIS — Z170 Estrogen receptor positive status [ER+]: Secondary | ICD-10-CM | POA: Insufficient documentation

## 2022-11-05 DIAGNOSIS — Z8049 Family history of malignant neoplasm of other genital organs: Secondary | ICD-10-CM | POA: Insufficient documentation

## 2022-11-05 DIAGNOSIS — Z801 Family history of malignant neoplasm of trachea, bronchus and lung: Secondary | ICD-10-CM | POA: Insufficient documentation

## 2022-11-05 DIAGNOSIS — Z800 Family history of malignant neoplasm of digestive organs: Secondary | ICD-10-CM | POA: Insufficient documentation

## 2022-11-05 LAB — CBC WITH DIFFERENTIAL (CANCER CENTER ONLY)
Abs Immature Granulocytes: 0.01 10*3/uL (ref 0.00–0.07)
Basophils Absolute: 0 10*3/uL (ref 0.0–0.1)
Basophils Relative: 1 %
Eosinophils Absolute: 0.3 10*3/uL (ref 0.0–0.5)
Eosinophils Relative: 6 %
HCT: 38.9 % (ref 36.0–46.0)
Hemoglobin: 13.4 g/dL (ref 12.0–15.0)
Immature Granulocytes: 0 %
Lymphocytes Relative: 33 %
Lymphs Abs: 1.8 10*3/uL (ref 0.7–4.0)
MCH: 32 pg (ref 26.0–34.0)
MCHC: 34.4 g/dL (ref 30.0–36.0)
MCV: 92.8 fL (ref 80.0–100.0)
Monocytes Absolute: 0.4 10*3/uL (ref 0.1–1.0)
Monocytes Relative: 7 %
Neutro Abs: 2.9 10*3/uL (ref 1.7–7.7)
Neutrophils Relative %: 53 %
Platelet Count: 229 10*3/uL (ref 150–400)
RBC: 4.19 MIL/uL (ref 3.87–5.11)
RDW: 11.9 % (ref 11.5–15.5)
WBC Count: 5.5 10*3/uL (ref 4.0–10.5)
nRBC: 0 % (ref 0.0–0.2)

## 2022-11-05 LAB — CMP (CANCER CENTER ONLY)
ALT: 13 U/L (ref 0–44)
AST: 19 U/L (ref 15–41)
Albumin: 4.2 g/dL (ref 3.5–5.0)
Alkaline Phosphatase: 78 U/L (ref 38–126)
Anion gap: 5 (ref 5–15)
BUN: 16 mg/dL (ref 6–20)
CO2: 30 mmol/L (ref 22–32)
Calcium: 9.6 mg/dL (ref 8.9–10.3)
Chloride: 104 mmol/L (ref 98–111)
Creatinine: 0.97 mg/dL (ref 0.44–1.00)
GFR, Estimated: >60 mL/min (ref >60)
Glucose, Bld: 102 mg/dL — ABNORMAL HIGH (ref 70–99)
Potassium: 3.8 mmol/L (ref 3.5–5.1)
Sodium: 139 mmol/L (ref 135–145)
Total Bilirubin: 0.4 mg/dL (ref 0.3–1.2)
Total Protein: 6.7 g/dL (ref 6.5–8.1)

## 2022-11-05 MED ORDER — METHYLPREDNISOLONE 4 MG PO TBPK: ORAL_TABLET | ORAL | 0 refills | Status: DC

## 2022-11-05 NOTE — Assessment & Plan Note (Signed)
Rie is a 57 year old woman with history of stage Ia ER/PR positive left-sided breast cancer diagnosed in January 2023 status post lumpectomy followed by adjuvant radiation and antiestrogen therapy with anastrozole.  Stage IA left sided ER/PR positive breast cancer: She has no clinical or radiographic signs of breast cancer recurrence.  She will continue on anastrozole daily.  Her next mammogram is due in December 2024.  Since her breast cancer was mammographically occult she will undergo a breast MRI in June 2024.   Bone health: We are obtaining her bone density testing from Dr. Gaye Alken office.  She should undergo bone density testing every 2 years while taking anastrozole daily.  Bone health reviewed today. CDK2NA mutation: This increases her risk for melanoma and pancreatic cancer.  She is seeing dermatology every 6 months as recommended an MRCP was completed in February 2024. Health maintenance: I recommended that she continue to follow-up with her primary care provider and gynecologist.  Healthy diet and exercise was recommended. Left elbow pain: I prescribed a Medrol Dosepak.  If this does not help we will refer her to physical therapy since it is in the same arm that underwent sentinel node biopsy.  Aren will RTC in 6 months for labs and f/u.

## 2022-11-05 NOTE — Progress Notes (Signed)
[facility] Cancer Follow up:    Wanda Fillers, MD [address] [place] [address]   DIAGNOSIS:  Cancer Staging  Malignant neoplasm of upper-outer quadrant of left breast in female, estrogen receptor positive Staging form: Breast, AJCC 8th Edition - Clinical: Stage IA (cT1a, cN0, cM0, G1, ER+, PR+, HER2: Equivocal) - Unsigned Stage prefix: Initial diagnosis Method of lymph node assessment: Clinical Histologic grading system: 3 grade system - Pathologic stage from 09/05/2021: Stage IA (pT1a, pN0, cM0, G1, ER+, PR+, HER2-) - Signed by Malachy Mood, MD on 11/07/2021 Stage prefix: Initial diagnosis Histologic grading system: 3 grade system Residual tumor (R): R0 - None   SUMMARY OF ONCOLOGIC HISTORY: Oncology History Overview Note   Cancer Staging  Malignant neoplasm of upper-outer quadrant of left breast in female, estrogen receptor positive (HCC) Staging form: Breast, AJCC 8th Edition - Clinical: Stage IA (cT1a, cN0, cM0, G1, ER+, PR+, HER2: Equivocal) - Unsigned - Pathologic stage from 09/05/2021: Stage IA (pT1a, pN0, cM0, G1, ER+, PR+, HER2-) - Signed by Malachy Mood, MD on 11/07/2021    Malignant neoplasm of upper-outer quadrant of left breast in female, estrogen receptor positive  07/31/2021 Imaging   CLINICAL DATA:  Screening recall for a left breast asymmetry.   EXAM: DIGITAL DIAGNOSTIC UNILATERAL LEFT MAMMOGRAM WITH TOMOSYNTHESIS AND CAD; ULTRASOUND LEFT BREAST LIMITED  IMPRESSION: 1. There is an indeterminate 3 mm mass in the left breast at 2 o'clock.   2.  No evidence of left axillary lymphadenopathy.   08/04/2021 Initial Biopsy   Diagnosis Breast, left, needle core biopsy, UOQ, 2 o'clock, 3cmfn, ribbon clip - INVASIVE DUCTAL CARCINOMA - DUCTAL CARCINOMA IN SITU - SEE COMMENT  Microscopic Comment Based on the biopsy, the carcinoma appears Nottingham grade 1 of 3 and measures 0.5 cm in greatest linear extent.  PROGNOSTIC  INDICATORS Results: The tumor cells are EQUIVOCAL for Her2 (2+). Her2 by FISH will be performed and results reported separately. 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 Of note, a variant of uncertain significance was detected in the RAD51C gene ( p.I52L). Report date is 08/27/2021.  [facility] CustomNext-Cancer+RNAinsight panel offered by [employer] includes sequencing and rearrangement analysis for the following 52 genes:  APC, ATM, AXIN2, BAP1, BARD1, BMPR1A, BRCA1, BRCA2, BRIP1, CDH1, CDK4, CDKN2A, CHEK2, CTNNA1, DICER1, EPCAM, FH, FLCN, GREM1, HOXB13, KIT, MEN1, MET, MITF, MLH1, MSH2, MSH3, MSH6, MUTYH, NBN, NF1, NTHL1, PALB2, PDGFRA, PMS2, POLD1, POLE, PTEN, RAD50, RAD51C, RAD51D, SDHA, SDHB, SDHC, SDHD, SMAD4, SMARCA4, STK11, TP53, TSC1, TSC2, and VHL.  RNA data is routinely analyzed for use in variant interpretation for all genes.    09/05/2021 Cancer Staging   Staging form: Breast, AJCC 8th Edition - Pathologic stage from 09/05/2021: Stage IA (pT1a, pN0, cM0, G1, ER+, PR+, HER2-) - Signed by Malachy Mood, MD on 11/07/2021 Stage prefix: Initial diagnosis Histologic grading system: 3 grade system Residual tumor (R): R0 - None   09/05/2021 Definitive Surgery   FINAL MICROSCOPIC DIAGNOSIS:   A. LYMPH NODE, LEFT AXILLARY #1, SENTINEL, EXCISION:  -  No carcinoma identified in one lymph node (0/1)   B. LYMPH NODE, LEFT AXILLARY, SENTINEL, EXCISION:  -  No carcinoma identified in one lymph  node (0/1)   C. LYMPH NODE, LEFT AXILLARY, SENTINEL, EXCISION:  -  No carcinoma identified in one lymph node (0/1)   D. LYMPH NODE, LEFT AXILLARY, SENTINEL,  EXCISION:  -  No carcinoma identified in one lymph node (0/1)   E. BREAST, LEFT, LUMPECTOMY:  -  Invasive ductal carcinoma, Nottingham grade 1 of 3, 0.3 cm  -  Ductal carcinoma in-situ, low-grade  -  Calcifications associated with carcinoma  -  Margins uninvolved by carcinoma (0.25 cm; superior margin; see part F for final margin status)  -  Previous biopsy site changes present  -  See oncology table and comment below   F. BREAST, LEFT SUPERIOR AND DEEP MARGIN, EXCISION:  -  No residual carcinoma identified   G. BREAST, LEFT LATERAL MARGIN, EXCISION:  -  No residual carcinoma identified    10/13/2021 - 11/07/2021 Radiation Therapy   Site Technique Total Dose (Gy) Dose per Fx (Gy) Completed Fx Beam Energies  Breast, Left: Breast_L 3D 42.56/42.56 2.66 16/16 6XFFF  Breast, Left: Breast_L_Bst 3D 8/8 2 4/4 6X     11/2021 -  Anti-estrogen oral therapy   Anastrozole     CURRENT THERAPY: anastrozole  INTERVAL HISTORY: Wanda Castro 57 y.o. female returns for follow-up for her history of estrogen positive breast cancer currently on treatment with anastrozole.  Wanda Castro is tolerating this well and denies any significant issues.  Wanda Castro does note that Wanda Castro has had some discomfort in her left antecubital fossa over the past few weeks and wonders what this could be related to.  Of note her breast cancer was on the left side and Wanda Castro did have a sentinel node biopsy.  Her most recent bilateral breast mammogram occurred on June 22, 2022 demonstrating no mammographic evidence of malignancy and breast density category B.  At 6 weeks later the patient palpated a lump underneath her left areola.  Mammogram and ultrasound was performed demonstrating calcified oil cyst and no evidence of malignancy.  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   Patient Active Problem List   Diagnosis Date Noted   Heart murmur 05/05/2022   Monoallelic mutation of CDKN2A gene 09/08/2021   Genetic  testing 08/25/2021   Family history of colon cancer 08/13/2021   Family history of ovarian cancer 08/13/2021   Malignant neoplasm of upper-outer quadrant of left breast in female, estrogen receptor positive 08/11/2021   Hypertensive disorder 06/21/2019   Migraine 06/21/2019    is allergic to adhesive [tape].  MEDICAL HISTORY: Past Medical History:  Diagnosis Date   Anxiety    GERD (gastroesophageal reflux disease)    Heart murmur    HTN (hypertension)    Hypertension    Migraine    Personal history of radiation therapy    Right ureteral stone    Wears contact lenses     SURGICAL HISTORY: Past Surgical History:  Procedure Laterality Date   BREAST BIOPSY Left 08/04/2021   BREAST LUMPECTOMY Left 09/05/2021   BREAST LUMPECTOMY WITH RADIOACTIVE SEED AND SENTINEL LYMPH NODE BIOPSY Left 09/05/2021   Procedure: LEFT BREAST LUMPECTOMY WITH RADIOACTIVE SEED AND SENTINEL LYMPH NODE BIOPSY;  Surgeon: Griselda Miner, MD;  Location: [facility];  Service: General;  Laterality: Left;   COLONOSCOPY  2015 approx    SOCIAL HISTORY: Social History   Socioeconomic History   Marital status: Single    Spouse name: Not on file   Number of children: 0   Years of education:  Not on file   Highest education level: Not on file  Occupational History   Occupation: Wanda Castro works in administration in [employer]  Tobacco Use   Smoking status: Never   Smokeless tobacco: Never  Substance and Sexual Activity   Alcohol use: No   Drug use: No   Sexual activity: Not on file  Other Topics Concern   Not on file  Social History Narrative   Not on file   Social Determinants of Health   Financial Resource Strain: Not on file  Food Insecurity: Not on file  Transportation Needs: Not on file  Physical Activity: Not on file  Stress: Not on file  Social Connections: Not on file  Intimate Partner Violence: Not on file    FAMILY HISTORY: Family History  Problem Relation Age  of Onset   Atrial fibrillation Mother    COPD Mother    Hypertension Father    Renal cancer Father 49   Cancer Maternal Uncle 42       colon cancer   Cancer Paternal Aunt        small cell lung cancer   Prostate cancer Paternal Uncle    Prostate cancer Paternal Uncle    Ovarian cancer Cousin 33       maternal first cousin   Lung cancer Cousin        paternal first cousin   Lung cancer Cousin        paternal first cousin   Uterine cancer Cousin        paternal first cousin, dx. 30s   Ovarian cancer Cousin        dx. 30s   Colon cancer Cousin        paternal first cousin, dx. 75s    Review of Systems  Constitutional:  Negative for appetite change, chills, fatigue, fever and unexpected weight change.  HENT:   Negative for hearing loss, lump/mass and trouble swallowing.   Eyes:  Negative for eye problems and icterus.  Respiratory:  Negative for chest tightness, cough and shortness of breath.   Cardiovascular:  Negative for chest pain, leg swelling and palpitations.  Gastrointestinal:  Negative for abdominal distention, abdominal pain, constipation, diarrhea, nausea and vomiting.  Endocrine: Negative for hot flashes.  Genitourinary:  Negative for difficulty urinating.   Musculoskeletal:  Negative for arthralgias.  Skin:  Negative for itching and rash.  Neurological:  Negative for dizziness, extremity weakness, headaches and numbness.  Hematological:  Negative for adenopathy. Does not bruise/bleed easily.  Psychiatric/Behavioral:  Negative for depression. The patient is not nervous/anxious.       PHYSICAL EXAMINATION    Vitals:   11/05/22 1507  BP: (!) 118/54  Pulse: 83  Resp: 18  Temp: 98.1 F (36.7 C)  SpO2: 100%    Physical Exam Constitutional:      General: Wanda Castro is not in acute distress.    Appearance: Normal appearance. Wanda Castro is not toxic-appearing.  HENT:     Head: Normocephalic and atraumatic.  Eyes:     General: No scleral icterus. Cardiovascular:      Rate and Rhythm: Normal rate and regular rhythm.     Pulses: Normal pulses.     Heart sounds: Normal heart sounds.  Pulmonary:     Effort: Pulmonary effort is normal.     Breath sounds: Normal breath sounds.  Chest:     Comments: Left breast status postlumpectomy and radiation no sign of local recurrence right breast is benign. Abdominal:  General: Abdomen is flat. Bowel sounds are normal. There is no distension.     Palpations: Abdomen is soft.     Tenderness: There is no abdominal tenderness.  Musculoskeletal:        General: No swelling.     Cervical back: Neck supple.  Lymphadenopathy:     Cervical: No cervical adenopathy.  Skin:    General: Skin is warm and dry.     Findings: No rash.  Neurological:     General: No focal deficit present.     Mental Status: Wanda Castro is alert.  Psychiatric:        Mood and Affect: Mood normal.        Behavior: Behavior normal.     LABORATORY DATA:  CBC    Component Value Date/Time   WBC 5.5 11/05/2022 1437   WBC 11.2 (H) 11/28/2015 2326   RBC 4.19 11/05/2022 1437   HGB 13.4 11/05/2022 1437   HCT 38.9 11/05/2022 1437   PLT 229 11/05/2022 1437   MCV 92.8 11/05/2022 1437   MCH 32.0 11/05/2022 1437   MCHC 34.4 11/05/2022 1437   RDW 11.9 11/05/2022 1437   LYMPHSABS 1.8 11/05/2022 1437   MONOABS 0.4 11/05/2022 1437   EOSABS 0.3 11/05/2022 1437   BASOSABS 0.0 11/05/2022 1437    CMP     Component Value Date/Time   NA 139 11/05/2022 1437   K 3.8 11/05/2022 1437   CL 104 11/05/2022 1437   CO2 30 11/05/2022 1437   GLUCOSE 102 (H) 11/05/2022 1437   BUN 16 11/05/2022 1437   CREATININE 0.97 11/05/2022 1437   CALCIUM 9.6 11/05/2022 1437   PROT 6.7 11/05/2022 1437   ALBUMIN 4.2 11/05/2022 1437   AST 19 11/05/2022 1437   ALT 13 11/05/2022 1437   ALKPHOS 78 11/05/2022 1437   BILITOT 0.4 11/05/2022 1437   GFRNONAA >60 11/05/2022 1437   GFRAA >60 11/28/2015 2326     ASSESSMENT and THERAPY PLAN:   Malignant neoplasm of  upper-outer quadrant of left breast in female, estrogen receptor positive (HCC) Wanda Castro is a 57 year old woman with history of stage Ia ER/PR positive left-sided breast cancer diagnosed in January 2023 status post lumpectomy followed by adjuvant radiation and antiestrogen therapy with anastrozole.  Stage IA left sided ER/PR positive breast cancer: Wanda Castro has no clinical or radiographic signs of breast cancer recurrence.  Wanda Castro will continue on anastrozole daily.  Her next mammogram is due in December 2024.  Since her breast cancer was mammographically occult Wanda Castro will undergo a breast MRI in June 2024.   Bone health: We are obtaining her bone density testing from Dr. Gaye Alken office.  Wanda Castro should undergo bone density testing every 2 years while taking anastrozole daily.  Bone health reviewed today. CDK2NA mutation: This increases her risk for melanoma and pancreatic cancer.  Wanda Castro is seeing dermatology every 6 months as recommended an MRCP was completed in February 2024. Health maintenance: I recommended that Wanda Castro continue to follow-up with her primary care provider and gynecologist.  Healthy diet and exercise was recommended. Left elbow pain: I prescribed a Medrol Dosepak.  If this does not help we will refer her to physical therapy since it is in the same arm that underwent sentinel node biopsy.  Tandrea will RTC in 6 months for labs and f/u.      All questions were answered. The patient knows to call the clinic with any problems, questions or concerns. We can certainly see the patient much sooner if necessary.  Total encounter time:30 minutes*in face-to-face visit time, chart review, lab review, care coordination, order entry, and documentation of the encounter time.    Lillard Anes, NP 11/05/22 3:42 PM Medical Oncology and Hematology [facility] [address] [place], [place] [address] Tel. [phone]    Fax. [phone]  *Total Encounter Time as defined by the Centers for  Medicare and Medicaid Services includes, in addition to the face-to-face time of a patient visit (documented in the note above) non-face-to-face time: obtaining and reviewing outside history, ordering and reviewing medications, tests or procedures, care coordination (communications with other health care professionals or caregivers) and documentation in the medical record.

## 2022-12-04 ENCOUNTER — Encounter: Payer: Self-pay | Admitting: Adult Health

## 2022-12-14 ENCOUNTER — Encounter: Payer: Self-pay | Admitting: Adult Health

## 2022-12-14 ENCOUNTER — Other Ambulatory Visit: Payer: BC Managed Care – PPO

## 2022-12-15 ENCOUNTER — Other Ambulatory Visit: Payer: Self-pay

## 2022-12-15 DIAGNOSIS — Z170 Estrogen receptor positive status [ER+]: Secondary | ICD-10-CM

## 2022-12-15 DIAGNOSIS — M25522 Pain in left elbow: Secondary | ICD-10-CM

## 2022-12-15 NOTE — Progress Notes (Signed)
Amb referral to PT placed per NP for left elbow pain unrelieved by oral steroid, relayed order to Pt.

## 2022-12-17 ENCOUNTER — Ambulatory Visit
Admission: RE | Admit: 2022-12-17 | Discharge: 2022-12-17 | Disposition: A | Discharge status: Home / Self Care | Payer: BC Managed Care – PPO | Source: Ambulatory Visit | Attending: Adult Health | Admitting: Adult Health

## 2022-12-17 DIAGNOSIS — Z1501 Genetic susceptibility to malignant neoplasm of breast: Secondary | ICD-10-CM

## 2022-12-17 DIAGNOSIS — Z170 Estrogen receptor positive status [ER+]: Secondary | ICD-10-CM

## 2022-12-17 MED ORDER — GADOPICLENOL 0.5 MMOL/ML IV SOLN
7.0000 mL | Freq: Once | INTRAVENOUS | Status: AC | PRN
Start: 2022-12-17 — End: 2022-12-17
  Administered 2022-12-17: 7 mL via INTRAVENOUS

## 2022-12-28 ENCOUNTER — Other Ambulatory Visit: Payer: Self-pay | Admitting: Hematology

## 2022-12-30 ENCOUNTER — Ambulatory Visit: Payer: BC Managed Care – PPO | Attending: Adult Health | Admitting: Physical Therapy

## 2022-12-30 DIAGNOSIS — M79602 Pain in left arm: Secondary | ICD-10-CM | POA: Diagnosis present

## 2022-12-30 DIAGNOSIS — R293 Abnormal posture: Secondary | ICD-10-CM | POA: Diagnosis present

## 2022-12-30 DIAGNOSIS — Z483 Aftercare following surgery for neoplasm: Secondary | ICD-10-CM | POA: Insufficient documentation

## 2022-12-30 DIAGNOSIS — M25522 Pain in left elbow: Secondary | ICD-10-CM | POA: Diagnosis not present

## 2022-12-30 DIAGNOSIS — Z170 Estrogen receptor positive status [ER+]: Secondary | ICD-10-CM | POA: Insufficient documentation

## 2022-12-30 DIAGNOSIS — C50412 Malignant neoplasm of upper-outer quadrant of left female breast: Secondary | ICD-10-CM | POA: Diagnosis present

## 2022-12-30 NOTE — Patient Instructions (Signed)

## 2022-12-30 NOTE — Therapy (Signed)
OUTPATIENT PHYSICAL THERAPY EVALUATION   Patient Name: Wanda Castro MRN: [id number] DOB:03-Mar-1966, 57 y.o., female Today's Date: 12/30/2022   PT End of Session - 12/30/22 1455     Visit Number 1    Number of Visits 5    Date for PT Re-Evaluation 03/01/23    PT Start Time 1410    PT Stop Time 1450    PT Time Calculation (min) 40 min    Activity Tolerance Patient tolerated treatment well    Behavior During Therapy [facility] for tasks assessed/performed              Past Medical History:  Diagnosis Date   Anxiety    GERD (gastroesophageal reflux disease)    Heart murmur    HTN (hypertension)    Hypertension    Migraine    Personal history of radiation therapy    Right ureteral stone    Wears contact lenses    Past Surgical History:  Procedure Laterality Date   BREAST BIOPSY Left 08/04/2021   BREAST LUMPECTOMY Left 09/05/2021   BREAST LUMPECTOMY WITH RADIOACTIVE SEED AND SENTINEL LYMPH NODE BIOPSY Left 09/05/2021   Procedure: LEFT BREAST LUMPECTOMY WITH RADIOACTIVE SEED AND SENTINEL LYMPH NODE BIOPSY;  Surgeon: Griselda Miner, MD;  Location: [facility];  Service: General;  Laterality: Left;   COLONOSCOPY  2015 approx   Patient Active Problem List   Diagnosis Date Noted   Heart murmur 05/05/2022   Monoallelic mutation of CDKN2A gene 09/08/2021   Genetic testing 08/25/2021   Family history of colon cancer 08/13/2021   Family history of ovarian cancer 08/13/2021   Malignant neoplasm of upper-outer quadrant of left breast in female, estrogen receptor positive (HCC) 08/11/2021   Hypertensive disorder 06/21/2019   Migraine 06/21/2019    PCP: [name], MD  REFERRING PROVIDER: Lillard Anes Cornett*  REFERRING DIAG: Left breast cancer  THERAPY DIAG: Pain in left arm.  Aftercare following surgery for neoplasm    ONSET DATE: 09/05/21  SUBJECTIVE:                                                                                                                                                                                            SUBJECTIVE STATEMENT: " Something has been going on with my arm for months"  she has developed a similar problem in both arms and wonders if it could be from the anastrozole   PERTINENT HISTORY:  Pt has had a problem with the antecubital fossa of her left arm for several months.  She has incident with a dog pulling her recently and is feeling the  same type of thing on her right arm.  Past history includes. Left lumpectomy and SLNB 09/05/21 with 0/3 LN positive.  Followed by radiation, no chemo GERD, HTN, migraines  PATIENT GOALS:  to find out what is going on with her her amr  PAIN:  Are you having pain? It is just sore every once in a while   PRECAUTIONS: Surgery, left UE Lymphedema risk, Other: lymphedema risk Lt UE  ACTIVITY LEVEL / LEISURE: Nothing new   OBJECTIVE:    OBSERVATIONS:  pt has well healed scar in axilla from sugery, she has light red erythematous patch near axilla that is being followed by a dermatologist    POSTURE:  WNL  LYMPHEDEMA ASSESSMENT:   UPPER EXTREMITY AROM/PROM:   A/PROM Right 08/19/2021 Left 08/19/2021 Left 09/24/21 Left 12/30/2022  Shoulder extension   65 65   Shoulder flexion   150 156 160  Shoulder abduction   160 165 165  Shoulder internal rotation   75    Shoulder external rotation   95 95                           (Blank rows = not tested)   Pt had full ROM of left elbow. She had pain with resisted elbow flexion in forearm in supination, neutral and pronation indicating possible tendinitis at muscle origins    CERVICAL AROM: All within normal limits:      LYMPHEDEMA ASSESSMENTS:    LANDMARK RIGHT 08/19/2021 LEFT 08/19/2021 Left 09/24/21  10 cm proximal to olecranon process   30.5 30.5  Olecranon process   24.5 24.5  10 cm proximal to ulnar styloid process   20.6 20.6  Just proximal to ulnar styloid process   15.5 15.5  Across hand at thumb web  space   18.5 18.5  At base of 2nd digit   6.3 6.3  (Blank rows = not tested)  The patient was assessed using the L-Dex machine today to produce a lymphedema index baseline score. The patient will be reassessed on a regular basis (typically every 3 months) to obtain new L-Dex scores. If the score is > 6.5 points away from his/her baseline score indicating onset of subclinical lymphedema, it will be recommended to wear a compression garment for 4 weeks, 12 hours per day and then be reassessed. If the score continues to be > 6.5 points from baseline at reassessment, we will initiate lymphedema treatment. Assessing in this manner has a 95% rate of preventing clinically significant lymphedema.    LDex score is -3.4 so is within acceptable range     PATIENT EDUCATION:  Education details:instructed pt in the importance of exercise instructed in supine scapular series with handout and given Strenth ABC handout for her to preview  Person educated: Patient Education method: Explanation Education comprehension: verbalized understanding   HOME EXERCISE PROGRAM:  Supine scapular program wit red theraband working up to 10 reps in standing.  Pt will also do resisted elbow flexion with red theraband and do stretch of flexors with hand places behind her on mat with elbow and wrist extension   ASSESSMENT:  CLINICAL IMPRESSION:  Pt presents with persistent elbow pain of unusual presentation.  She has a possible tendonitis . She does not have evidence of lymphedema per [facility] eval  Pt will benefit from a resisted exercise program with stretching to elbow flexors.  She was given a theraband strengthening program and is interested in learning the strength  ABC program, She was given the handout today due to time restraints to preview and learn more about it at next visit.  Pt will benefit from skilled therapeutic intervention to improve on the following deficits: Decreased knowledge of precautions, impaired UE  functional use, pain, decreased ROM, postural dysfunction.   PT treatment/interventions: ADL/Self care home management, Therapeutic exercises, Patient/Family education, DME instructions, and Manual therapy     GOALS: Goals reviewed with patient? Yes  LONG TERM GOALS:  (STG=LTG)  GOALS Name Target Date Goal status  1 Pt will report her elbow discomfort is decreased by 50%  03/01/2023 Initial   2 Pt will be independent in a home exercise program for strengthening 03/01/2023 Initial                PLAN: PT FREQUENCY/DURATION: continue SOZO visits every 3 months  se for 1-4 visitsover the next 8 weeks for exercise instructions   PLAN FOR NEXT SESSION: how is elbow with intital exercise? Progress as indicated teach Strength ABD   Bayard Hugger. Manson Passey PT  Donnetta Hail, PT 12/30/2022, 3:00 [facility] [address] [place], [place], [address] Phone: [phone]   Fax:  [phone]  Patient Details  Name: Wanda Castro MRN: [id number] Date of Birth: 07/16/1965 Referring Provider:  Lillard Anes Cornett*  Encounter Date: 12/30/2022  Bayard Hugger. Manson Passey PT  Donnetta Hail, PT 12/30/2022, 3:00 PM  [facility] [address] [place], [place], [address] Phone: [phone]   Fax:  [phone]

## 2023-01-18 ENCOUNTER — Ambulatory Visit: Payer: BC Managed Care – PPO

## 2023-01-24 IMAGING — MG MM DIGITAL SCREENING BILAT W/ TOMO AND CAD
8 series · 8 of 24 positions shown · non-contrast
Comparison: Previous exam(s).

CLINICAL DATA: Screening. History of cysts.

EXAM:
DIGITAL SCREENING BILATERAL MAMMOGRAM WITH TOMOSYNTHESIS AND CAD
TECHNIQUE: Bilateral screening digital craniocaudal and mediolateral oblique
mammograms were obtained. Bilateral screening digital breast
tomosynthesis was performed. The images were evaluated with
computer-aided detection.

[L MLO synth-2D]
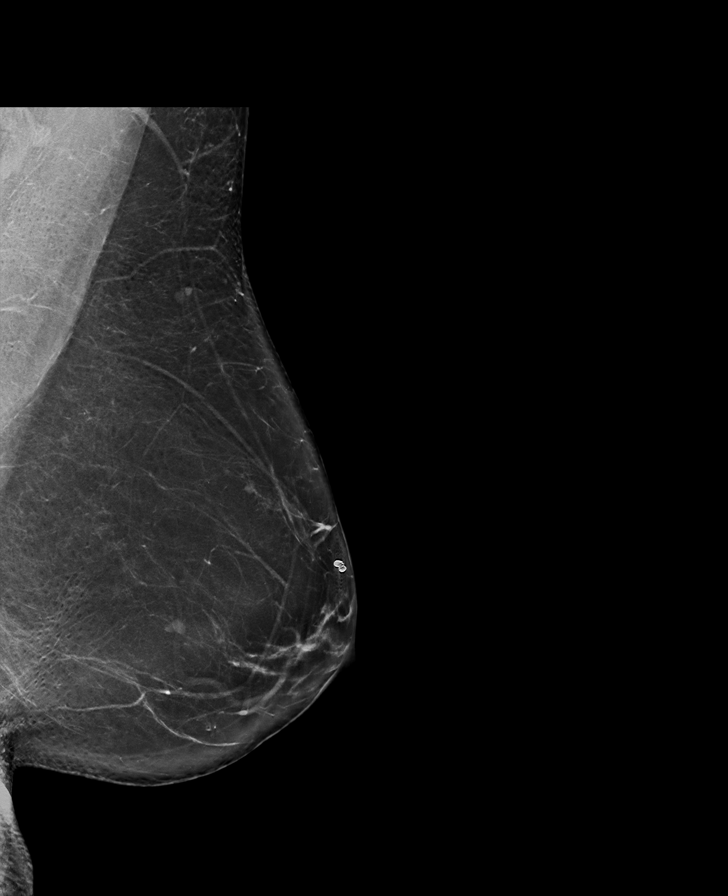

[R MLO synth-2D]
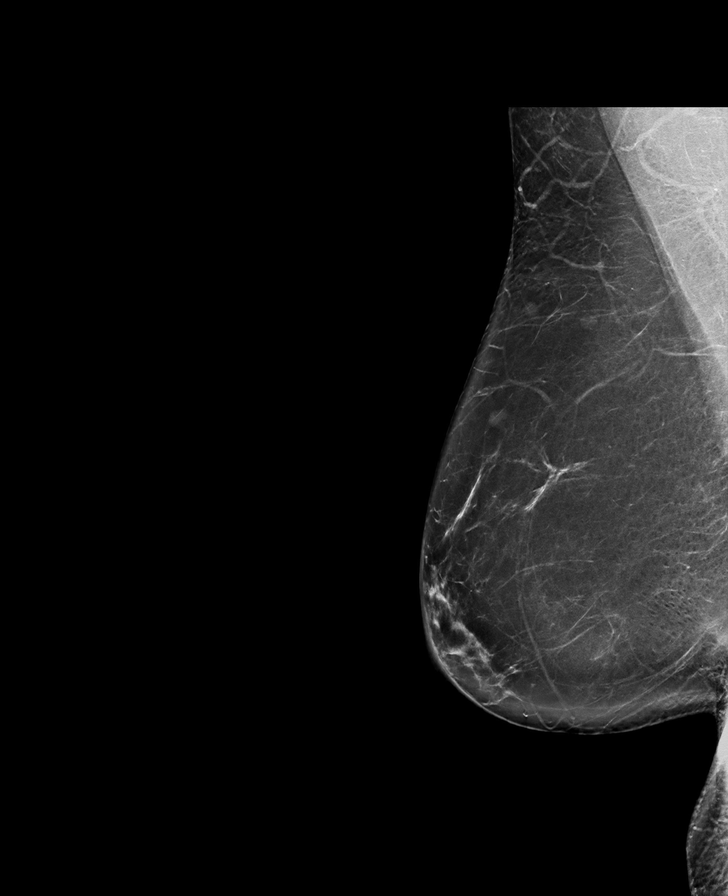

[L CC synth-2D]
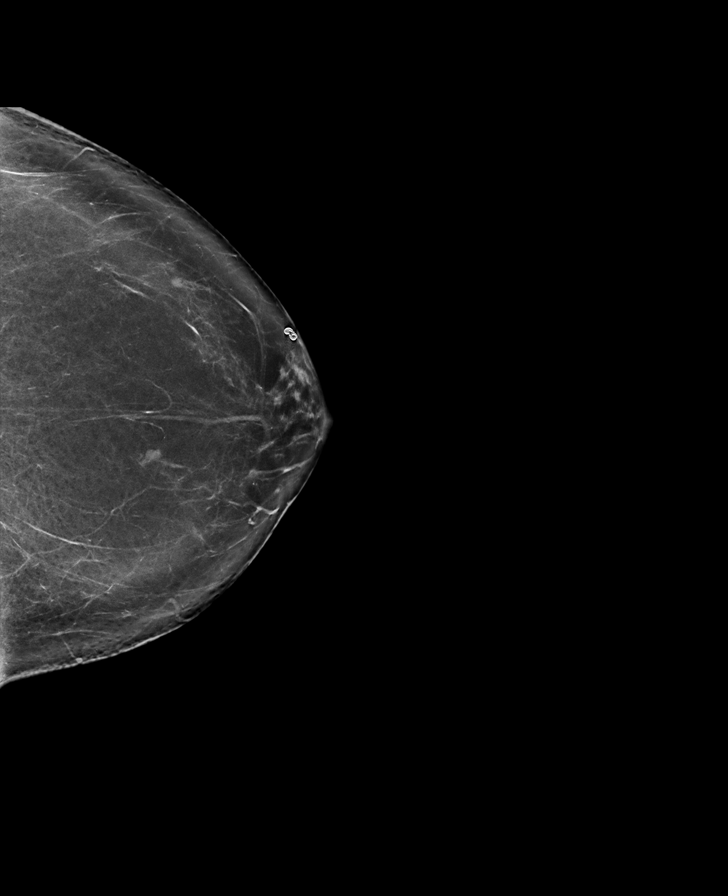

[R CC synth-2D]
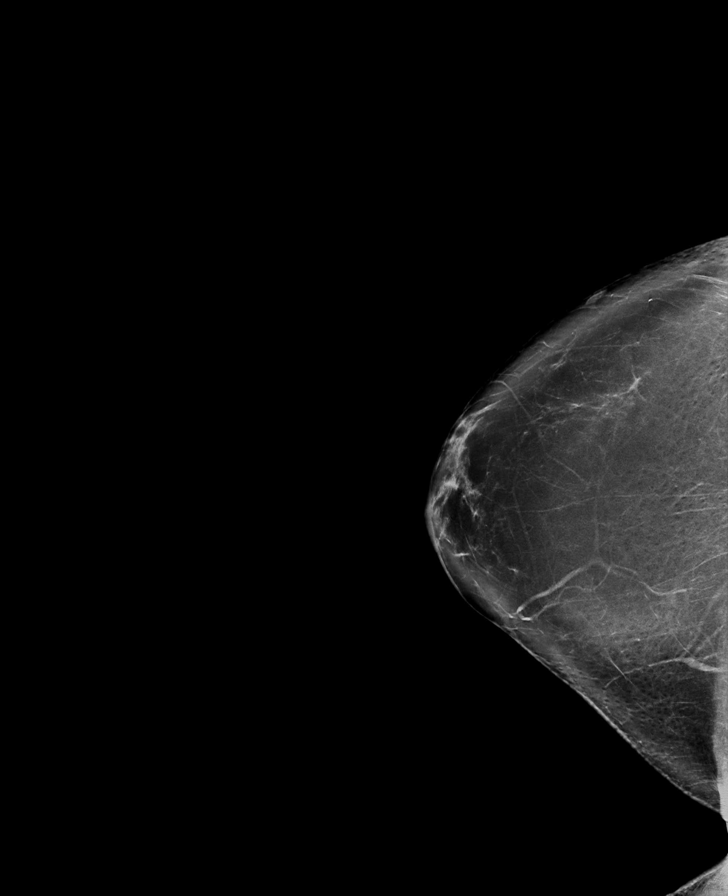

[L MLO tomo · tomo slice 43/86.0]
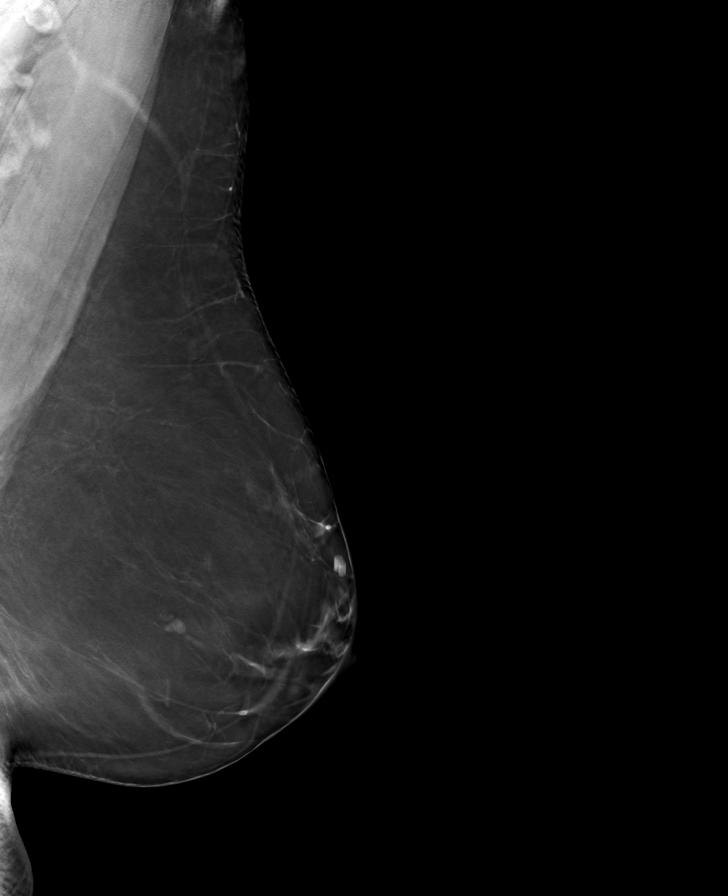

[L CC tomo · tomo slice 40/79.0]
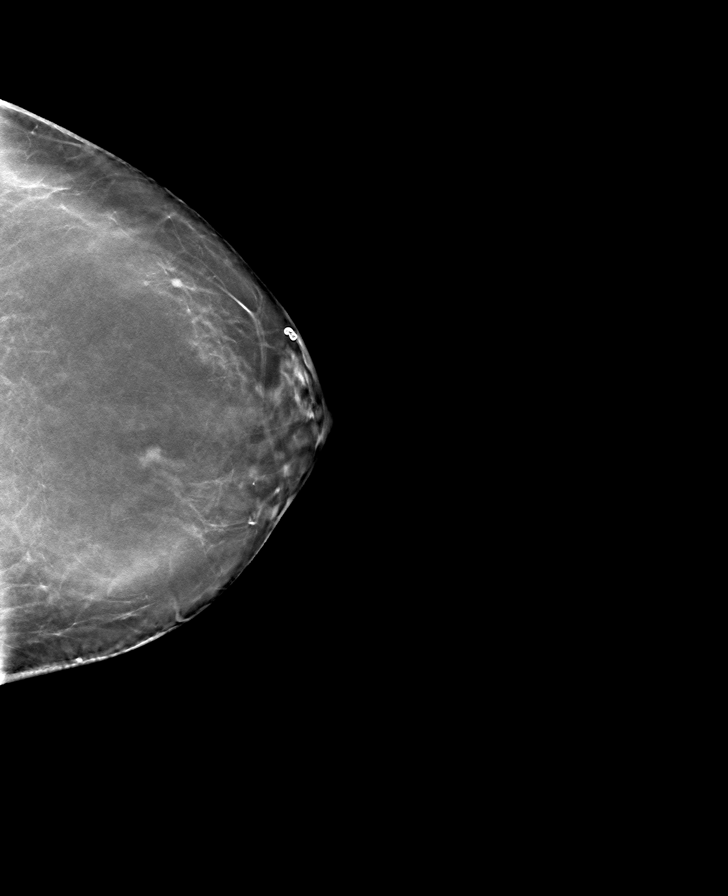

[R MLO tomo · tomo slice 42/83.0]
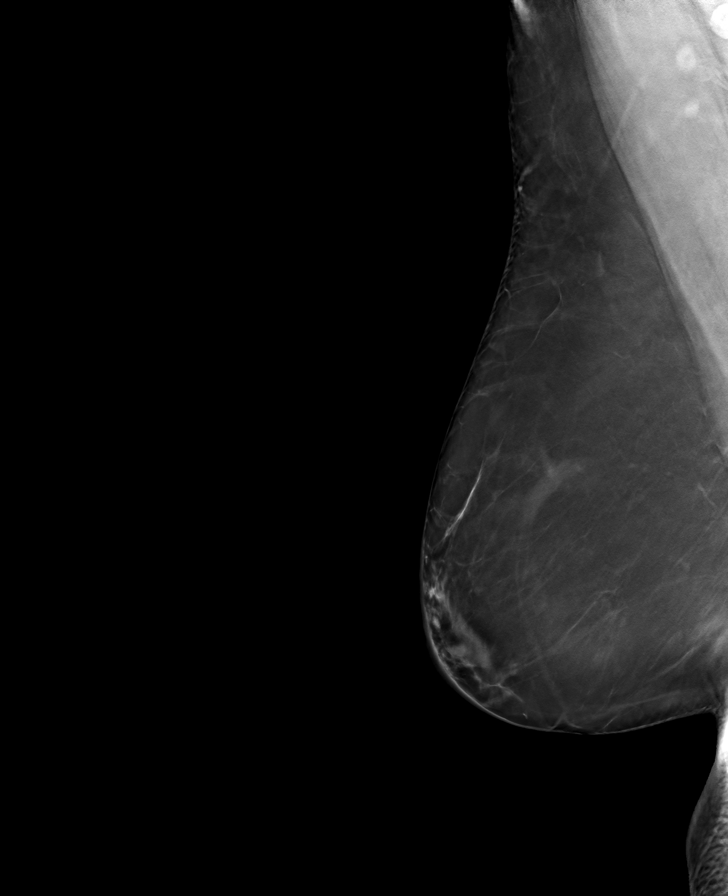

[R CC tomo · tomo slice 41/82.0]
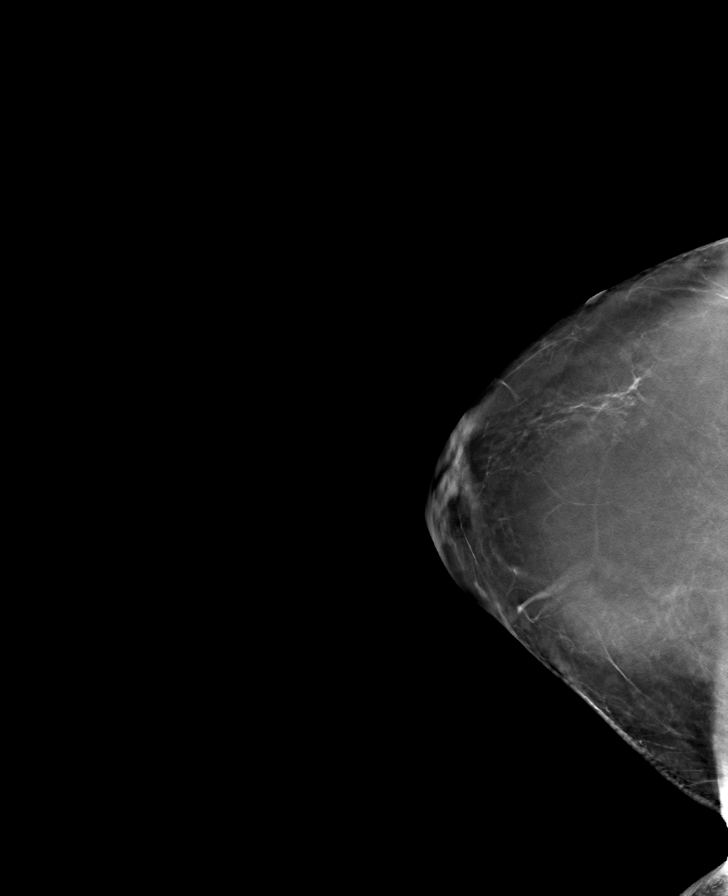

[8 of 24 positions shown; findings below may reference images not displayed]

ACR Breast Density Category b: There are scattered areas of
fibroglandular density.
FINDINGS: In the left breast, a possible focal asymmetry warrants further
evaluation. In the right breast, no findings suspicious for
malignancy.
IMPRESSION: Further evaluation is suggested for possible focal asymmetry in the
left breast.

RECOMMENDATION:
Diagnostic mammogram and possibly ultrasound of the left breast.
(Code:Z8-3-FFL)

The patient will be contacted regarding the findings, and additional
imaging will be scheduled.

BI-RADS CATEGORY  0: Incomplete. Need additional imaging evaluation
and/or prior mammograms for comparison.

## 2023-01-27 ENCOUNTER — Encounter: Payer: Self-pay | Admitting: Adult Health

## 2023-03-04 ENCOUNTER — Ambulatory Visit: Payer: BC Managed Care – PPO | Admitting: Family Medicine

## 2023-03-04 VITALS — BP 136/86 | Ht 63.0 in | Wt 167.0 lb

## 2023-03-04 DIAGNOSIS — M25522 Pain in left elbow: Secondary | ICD-10-CM | POA: Diagnosis not present

## 2023-03-04 DIAGNOSIS — M25521 Pain in right elbow: Secondary | ICD-10-CM | POA: Diagnosis not present

## 2023-03-04 MED ORDER — NITROGLYCERIN 0.2 MG/HR TD PT24: MEDICATED_PATCH | TRANSDERMAL | 3 refills | Status: DC

## 2023-03-04 NOTE — Progress Notes (Addendum)
PCP: [name], MD  Subjective:   HPI: Patient is a 57 y.o. female here for evaluation of bilateral anterior elbow pain.  Pain started in January 2024.  At that time she followed-up with her oncologist, she is in remission from breast cancer currently taking anastrozole.  Oncology discontinued anastrozole for 2 weeks, which did not improve pain.  Afterward she was prescribed a short prednisone course, which provided minor benefit.  She was then sent to physical therapy, completed 2-3 visits with physical therapy and discontinued because she felt like it was not helping.  Today, she is not in pain at rest.  She says pain is primarily after lifting and moving arms.  Sometimes pain and stiffness is worse in the morning, but not consistently.  Denies weakness.  Having mild intermittent right shoulder pain, but no pain elsewhere in the body.  She takes Goody's powder and ibuprofen intermittently which improves pain.  Past Medical History:  Diagnosis Date   Anxiety    GERD (gastroesophageal reflux disease)    Heart murmur    HTN (hypertension)    Hypertension    Migraine    Personal history of radiation therapy    Right ureteral stone    Wears contact lenses     Current Outpatient Medications on File Prior to Visit  Medication Sig Dispense Refill   ALPRAZolam (XANAX) 0.25 MG tablet Take 0.25 mg by mouth at bedtime as needed for anxiety. (Patient not taking: Reported on 01/30/2022)     anastrozole (ARIMIDEX) 1 MG tablet TAKE 1 TABLET BY MOUTH EVERY DAY 90 tablet 0   Calcium Carb-Cholecalciferol (CALCIUM 600 + D PO) Take 1 tablet by mouth daily.     losartan-hydrochlorothiazide (HYZAAR) 100-12.5 MG tablet Take by mouth daily.     methylPREDNISolone (MEDROL DOSEPAK) 4 MG TBPK tablet Taper 6,5,4,3,2,1 21 tablet 0   mometasone (ELOCON) 0.1 % cream mometasone 0.1 % topical cream  APPLY TO EXTERNAL EAR ONCE DAILY AS NEEDED ITCHING     Pyridoxine HCl (VITAMIN B6) 100 MG TABS Take 100 mg by  mouth daily.     No current facility-administered medications on file prior to visit.    Past Surgical History:  Procedure Laterality Date   BREAST BIOPSY Left 08/04/2021   BREAST LUMPECTOMY Left 09/05/2021   BREAST LUMPECTOMY WITH RADIOACTIVE SEED AND SENTINEL LYMPH NODE BIOPSY Left 09/05/2021   Procedure: LEFT BREAST LUMPECTOMY WITH RADIOACTIVE SEED AND SENTINEL LYMPH NODE BIOPSY;  Surgeon: Griselda Miner, MD;  Location: [facility];  Service: General;  Laterality: Left;   COLONOSCOPY  2015 approx    Allergies  Allergen Reactions   Adhesive [Tape] Other (See Comments)    Red irritated skin    BP 136/86   Ht 5\' 3"  (1.6 m)   Wt 167 lb (75.8 kg)   BMI 29.58 kg/m       No data to display              No data to display              Objective:  Physical Exam:  Gen: NAD, comfortable in exam room  Right shoulder: No gross deformity, no ecchymosis, no swelling. Mild tenderness over trapezius. FROM. 5/5 strength. Bilateral arm/elbow: No gross deformity, no ecchymosis, no swelling. FROM bilaterally.  Mild TTP over distal biceps tendon bilaterally, worse with resistance. 5/5 strength and normal sensation bilaterally.  No abnormal laxity with valgus/varus stress.   Assessment & Plan:  1.  Bilateral distal biceps tendinitis: No evidence of rupture. Recommend continued physical therapy, referral sent.  Home exercise program demonstrated today.  Will prescribe Nitropatch, counseled patient on use and side effects. OTC naproxen.  If patient is not improving, would consider ESWT. Follow-up in 4 to 6 weeks.

## 2023-03-04 NOTE — Patient Instructions (Signed)
You have distal biceps tendinopathy. Start physical therapy and do home exercises on days you don't go to therapy. Nitroglycerin patches 1/4th patch to affected elbow, change daily.  If tolerating after a week you can put 1/4th of a patch on both elbows and change daily. If headaches are too bad, stop - would consider shockwave therapy as next step. Aleve 1-2 tabs twice a day with food as needed for pain and inflammation. Follow up with me in 6 weeks.

## 2023-03-05 ENCOUNTER — Encounter: Payer: Self-pay | Admitting: Family Medicine

## 2023-03-17 ENCOUNTER — Other Ambulatory Visit: Payer: Self-pay

## 2023-03-17 ENCOUNTER — Ambulatory Visit: Payer: BC Managed Care – PPO | Attending: Family Medicine

## 2023-03-17 DIAGNOSIS — R252 Cramp and spasm: Secondary | ICD-10-CM | POA: Diagnosis present

## 2023-03-17 DIAGNOSIS — M6281 Muscle weakness (generalized): Secondary | ICD-10-CM

## 2023-03-17 DIAGNOSIS — M25521 Pain in right elbow: Secondary | ICD-10-CM | POA: Diagnosis present

## 2023-03-17 DIAGNOSIS — M25522 Pain in left elbow: Secondary | ICD-10-CM | POA: Diagnosis present

## 2023-03-17 NOTE — Therapy (Signed)
OUTPATIENT PHYSICAL THERAPY UPPER EXTREMITY EVALUATION   Patient Name: Wanda Castro MRN: [id number] DOB:December 11, 1965, 57 y.o., female Today's Date: 03/17/2023  END OF SESSION:  PT End of Session - 03/17/23 1426     Visit Number 1    Date for PT Re-Evaluation 05/12/23    Authorization Type [employer] PPO    PT Start Time 1145    PT Stop Time 1230    PT Time Calculation (min) 45 min    Activity Tolerance Patient tolerated treatment well    Behavior During Therapy WFL for tasks assessed/performed             Past Medical History:  Diagnosis Date   Anxiety    GERD (gastroesophageal reflux disease)    Heart murmur    HTN (hypertension)    Hypertension    Migraine    Personal history of radiation therapy    Right ureteral stone    Wears contact lenses    Past Surgical History:  Procedure Laterality Date   BREAST BIOPSY Left 08/04/2021   BREAST LUMPECTOMY Left 09/05/2021   BREAST LUMPECTOMY WITH RADIOACTIVE SEED AND SENTINEL LYMPH NODE BIOPSY Left 09/05/2021   Procedure: LEFT BREAST LUMPECTOMY WITH RADIOACTIVE SEED AND SENTINEL LYMPH NODE BIOPSY;  Surgeon: Griselda Miner, MD;  Location: [facility];  Service: General;  Laterality: Left;   COLONOSCOPY  2015 approx   Patient Active Problem List   Diagnosis Date Noted   Heart murmur 05/05/2022   Monoallelic mutation of CDKN2A gene 09/08/2021   Genetic testing 08/25/2021   Family history of colon cancer 08/13/2021   Family history of ovarian cancer 08/13/2021   Malignant neoplasm of upper-outer quadrant of left breast in female, estrogen receptor positive (HCC) 08/11/2021   Hypertensive disorder 06/21/2019   Migraine 06/21/2019    PCP: [name], MD   REFERRING PROVIDER: Lenda Kelp, MD  REFERRING DIAG:  Diagnosis  M25.521,M25.522 (ICD-10-CM) - Pain of both elbows    THERAPY DIAG:  Pain in right elbow - Plan: PT plan of care cert/re-cert  Pain in left elbow - Plan: PT plan  of care cert/re-cert  Muscle weakness (generalized) - Plan: PT plan of care cert/re-cert  Cramp and spasm - Plan: PT plan of care cert/re-cert  Rationale for Evaluation and Treatment: Rehabilitation  ONSET DATE: 03/04/2023  SUBJECTIVE:                                                                                                                                                                                      SUBJECTIVE STATEMENT: Patient reports recent onset of bilateral distal biceps pain.  This started  in the left bicep but is now in the right.  She was Rx'd prednisone dose pack without success and has been using Nitroglycerin patches in the past two days to help control the inflammation.  She feels that the nitro patches may be making a difference but she has experienced some mild headaches.  She hopes to gain control over the pain and be able to do her usual level of activity.    Hand dominance: Right  PERTINENT HISTORY: Left breast lumpectomy with radiation  PAIN:  Are you having pain? Yes: NPRS scale: 6/10 Pain location: bilateral distal biceps Pain description: only with use, aching, sometimes sharp Aggravating factors: picking up something heavy Relieving factors: medication, nitro  patches  PRECAUTIONS: None  RED FLAGS: None   WEIGHT BEARING RESTRICTIONS: No  FALLS:  Has patient fallen in last 6 months? No  LIVING ENVIRONMENT: Lives with: lives with their family Lives in: House/apartment   OCCUPATION: Office support  PLOF: Independent, Independent with basic ADLs, Independent with household mobility without device, Independent with community mobility without device, Independent with homemaking with ambulation, Independent with gait, and Independent with transfers  PATIENT GOALS: She hopes to gain control over the pain and be able to do her usual level of activity.     NEXT MD VISIT: prn  OBJECTIVE:   DIAGNOSTIC FINDINGS: pt44 na  PATIENT SURVEYS :   FOTO 74, predicted 71 goal met  COGNITION: Overall cognitive status: Within functional limits for tasks assessed     SENSATION: WFL  POSTURE: Slightly rounded shoulders but overall good posture for the type work she does  UPPER EXTREMITY ROM:   WFL  UPPER EXTREMITY MMT:  All 5/5 with exception of bilateral shoulder ER 4/5.   SHOULDER SPECIAL TESTS: Impingement tests: Hawkins/Kennedy impingement test: negative  Rotator cuff assessment: Drop arm test: negative and Empty can test: negative Biceps assessment: Speed's test: positive   JOINT MOBILITY TESTING:  na  PALPATION:  Tender at distal biceps only   TODAY'S TREATMENT:                                                                                                                                         DATE: 03/17/23 Initial eval completed and initiated HEP  PATIENT EDUCATION: Education details: Educated on relation of shoulder weakness on elbow pain and initiated HEP Person educated: Patient Education method: Programmer, multimedia, Facilities manager, Verbal cues, and Handouts Education comprehension: verbalized understanding, returned demonstration, and verbal cues required  HOME EXERCISE PROGRAM: Access Code: [email or web address] URL: [email or web address] Date: 03/17/2023 Prepared by: Mikey Kirschner  Exercises - Prone Shoulder Extension - Single Arm  - 2 x daily - 7 x weekly - 2 sets - 10 reps - Prone Shoulder Row  - 2 x daily - 7 x weekly - 2 sets - 10 reps - Prone Single Arm Shoulder Horizontal Abduction with Scapular Retraction and Palm Down  -  2 x daily - 7 x weekly - 2 sets - 10 reps - Sidelying Shoulder External Rotation  - 2 x daily - 7 x weekly - 2 sets - 10 reps - Single Arm Serratus Punches in Supine with Dumbbell  - 2 x daily - 7 x weekly - 2 sets - 10 reps - Seated Alternating Bicep Curls with Rotation and Dumbbells  - 1 x daily - 7 x weekly - 3 sets - 10 reps  ASSESSMENT:  CLINICAL IMPRESSION: Patient is  a 57 y.o. female who was seen today for physical therapy evaluation and treatment for bilateral distal biceps pain.  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  She recently had left breast lumpectomy and radiation on the left chest wall.  Her pain is primarily on the left but has started in the right as well.  She does not recall any shoulder injuries in her past.  She doesn't feel she has any restriction from her radiation in the left shoulder.  She primarily has pain with any biceps loading.  She would benefit from bilateral shoulder stability and postural strengthening along with eccentric biceps loading.     OBJECTIVE IMPAIRMENTS: decreased ROM, decreased strength, increased fascial restrictions, increased muscle spasms, impaired flexibility, and pain.   ACTIVITY LIMITATIONS: carrying, lifting, sleeping, transfers, bed mobility, bathing, dressing, reach over head, and hygiene/grooming  PARTICIPATION LIMITATIONS: meal prep, cleaning, laundry, driving, shopping, occupation, and yard work  PERSONAL FACTORS: Careers information officer are also affecting patient's functional outcome.   REHAB POTENTIAL: Good  CLINICAL DECISION MAKING: Stable/uncomplicated  EVALUATION COMPLEXITY: Low  GOALS: Goals reviewed with patient? Yes  SHORT TERM GOALS: Target date: 04/14/2023   Pain report to be no greater than 4/10  Baseline: Goal status: INITIAL  2.  Patient will be independent with initial HEP  Baseline:  Goal status: INITIAL  LONG TERM GOALS: Target date: 05/12/2023   Patient to be independent with advanced HEP  Baseline:  Goal status: INITIAL  2.  Patient to report pain no greater than 2/10  Baseline:  Goal status: INITIAL  3.  Patient will be able to reach overhead into cabinets and on top of shelves without pain  Baseline:  Goal status: INITIAL  4.  Patient to be able to pick up usual daily items such as dishes, pots/pans, purse without  pain Baseline:  Goal status: INITIAL  5.  Patient to be able to sleep through the night  Baseline:  Goal status: INITIAL  6.  Patient to report 85% improvement in overall symptoms  Baseline:  Goal status: INITIAL  PLAN: PT FREQUENCY: 1-2x/week  PT DURATION: 8 weeks  PLANNED INTERVENTIONS: Therapeutic exercises, Therapeutic activity, Neuromuscular re-education, Patient/Family education, Self Care, Joint mobilization, Aquatic Therapy, Dry Needling, Electrical stimulation, Spinal mobilization, Cryotherapy, Moist heat, scar mobilization, Splintting, Taping, Vasopneumatic device, Traction, Ultrasound, Ionotophoresis 4mg /ml Dexamethasone, Manual therapy, and Re-evaluation  PLAN FOR NEXT SESSION: Review HEP, UBE, postural exercises, eccentric biceps    Vernell Barrier, PT 03/17/2023, 2:43 PM

## 2023-03-24 ENCOUNTER — Ambulatory Visit: Payer: BC Managed Care – PPO

## 2023-03-24 DIAGNOSIS — R252 Cramp and spasm: Secondary | ICD-10-CM

## 2023-03-24 DIAGNOSIS — M25521 Pain in right elbow: Secondary | ICD-10-CM

## 2023-03-24 DIAGNOSIS — M6281 Muscle weakness (generalized): Secondary | ICD-10-CM

## 2023-03-24 DIAGNOSIS — M25522 Pain in left elbow: Secondary | ICD-10-CM

## 2023-03-24 NOTE — Therapy (Signed)
OUTPATIENT PHYSICAL THERAPY UPPER EXTREMITY EVALUATION   Patient Name: Wanda Castro MRN: [id number] DOB:Aug 09, 1965, 57 y.o., female Today's Date: 03/24/2023  END OF SESSION:  PT End of Session - 03/24/23 1109     Visit Number 2    Date for PT Re-Evaluation 05/12/23    Authorization Type [employer] PPO    PT Start Time 1100    PT Stop Time 1138    PT Time Calculation (min) 38 min    Activity Tolerance Patient tolerated treatment well    Behavior During Therapy WFL for tasks assessed/performed             Past Medical History:  Diagnosis Date   Anxiety    GERD (gastroesophageal reflux disease)    Heart murmur    HTN (hypertension)    Hypertension    Migraine    Personal history of radiation therapy    Right ureteral stone    Wears contact lenses    Past Surgical History:  Procedure Laterality Date   BREAST BIOPSY Left 08/04/2021   BREAST LUMPECTOMY Left 09/05/2021   BREAST LUMPECTOMY WITH RADIOACTIVE SEED AND SENTINEL LYMPH NODE BIOPSY Left 09/05/2021   Procedure: LEFT BREAST LUMPECTOMY WITH RADIOACTIVE SEED AND SENTINEL LYMPH NODE BIOPSY;  Surgeon: Griselda Miner, MD;  Location: [facility];  Service: General;  Laterality: Left;   COLONOSCOPY  2015 approx   Patient Active Problem List   Diagnosis Date Noted   Heart murmur 05/05/2022   Monoallelic mutation of CDKN2A gene 09/08/2021   Genetic testing 08/25/2021   Family history of colon cancer 08/13/2021   Family history of ovarian cancer 08/13/2021   Malignant neoplasm of upper-outer quadrant of left breast in female, estrogen receptor positive (HCC) 08/11/2021   Hypertensive disorder 06/21/2019   Migraine 06/21/2019    PCP: [name], MD   REFERRING PROVIDER: Lenda Kelp, MD  REFERRING DIAG:  Diagnosis  M25.521,M25.522 (ICD-10-CM) - Pain of both elbows    THERAPY DIAG:  Pain in right elbow  Pain in left elbow  Muscle weakness (generalized)  Cramp and  spasm  Rationale for Evaluation and Treatment: Rehabilitation  ONSET DATE: 03/04/2023  SUBJECTIVE:                                                                                                                                                                                      SUBJECTIVE STATEMENT: Patient reports she is doing well.  No pain at the moment.     Hand dominance: Right  PERTINENT HISTORY: Left breast lumpectomy with radiation  PAIN:  Are you having pain? Yes: NPRS scale: 6/10 Pain location:  bilateral distal biceps Pain description: only with use, aching, sometimes sharp Aggravating factors: picking up something heavy Relieving factors: medication, nitro  patches  PRECAUTIONS: None  RED FLAGS: None   WEIGHT BEARING RESTRICTIONS: No  FALLS:  Has patient fallen in last 6 months? No  LIVING ENVIRONMENT: Lives with: lives with their family Lives in: House/apartment   OCCUPATION: Office support  PLOF: Independent, Independent with basic ADLs, Independent with household mobility without device, Independent with community mobility without device, Independent with homemaking with ambulation, Independent with gait, and Independent with transfers  PATIENT GOALS: She hopes to gain control over the pain and be able to do her usual level of activity.     NEXT MD VISIT: prn  OBJECTIVE:   DIAGNOSTIC FINDINGS: pt44 na  PATIENT SURVEYS :  FOTO 74, predicted 71 goal met  COGNITION: Overall cognitive status: Within functional limits for tasks assessed     SENSATION: WFL  POSTURE: Slightly rounded shoulders but overall good posture for the type work she does  UPPER EXTREMITY ROM:   WFL  UPPER EXTREMITY MMT:  All 5/5 with exception of bilateral shoulder ER 4/5.   SHOULDER SPECIAL TESTS: Impingement tests: Hawkins/Kennedy impingement test: negative  Rotator cuff assessment: Drop arm test: negative and Empty can test: negative Biceps assessment:  Speed's test: positive   JOINT MOBILITY TESTING:  na  PALPATION:  Tender at distal biceps only   TODAY'S TREATMENT:                                                                                                                                         DATE: 03/24/23 UBE x 4 min fwd only level 1 3 way scapular stabilization x 10 each UE 4D ball rolls x 20 each UE Seated flex bar in/out x 20 Seated flex bar with arms outstretched x 20 (like motorcycle gear) Eccentric bicep curls with 2 lbs x 10 each with 5 second lowering Prone shoulder ext, rows and horizontal abduction with 2 lbs x 20 each Side lying shoulder ER x 20 with 2 lbs Supine serratus punch with 2 lbs x 20  DATE: 03/17/23 Initial eval completed and initiated HEP  PATIENT EDUCATION: Education details: Educated on relation of shoulder weakness on elbow pain and initiated HEP Person educated: Patient Education method: Programmer, multimedia, Facilities manager, Verbal cues, and Handouts Education comprehension: verbalized understanding, returned demonstration, and verbal cues required  HOME EXERCISE PROGRAM: Access Code: [email or web address] URL: [email or web address] Date: 03/17/2023 Prepared by: Mikey Kirschner  Exercises - Prone Shoulder Extension - Single Arm  - 2 x daily - 7 x weekly - 2 sets - 10 reps - Prone Shoulder Row  - 2 x daily - 7 x weekly - 2 sets - 10 reps - Prone Single Arm Shoulder Horizontal Abduction with Scapular Retraction and Palm Down  - 2 x daily - 7 x weekly - 2 sets - 10 reps -  Sidelying Shoulder External Rotation  - 2 x daily - 7 x weekly - 2 sets - 10 reps - Single Arm Serratus Punches in Supine with Dumbbell  - 2 x daily - 7 x weekly - 2 sets - 10 reps - Seated Alternating Bicep Curls with Rotation and Dumbbells  - 1 x daily - 7 x weekly - 3 sets - 10 reps  ASSESSMENT:  CLINICAL IMPRESSION: [name] is progressing appropriately.  She has experienced little to no pain since last visit.  We added 2 lbs to all  shoulder and scapular stabilization with no increase in pain.  She was able to tolerate 3 lbs eccentric biceps loading.  She would benefit from bilateral shoulder stability and postural strengthening along with eccentric biceps loading.     OBJECTIVE IMPAIRMENTS: decreased ROM, decreased strength, increased fascial restrictions, increased muscle spasms, impaired flexibility, and pain.   ACTIVITY LIMITATIONS: carrying, lifting, sleeping, transfers, bed mobility, bathing, dressing, reach over head, and hygiene/grooming  PARTICIPATION LIMITATIONS: meal prep, cleaning, laundry, driving, shopping, occupation, and yard work  PERSONAL FACTORS: Careers information officer are also affecting patient's functional outcome.   REHAB POTENTIAL: Good  CLINICAL DECISION MAKING: Stable/uncomplicated  EVALUATION COMPLEXITY: Low  GOALS: Goals reviewed with patient? Yes  SHORT TERM GOALS: Target date: 04/14/2023   Pain report to be no greater than 4/10  Baseline: Goal status: MET 03/24/23  2.  Patient will be independent with initial HEP  Baseline:  Goal status: MET 03/24/23  LONG TERM GOALS: Target date: 05/12/2023   Patient to be independent with advanced HEP  Baseline:  Goal status: INITIAL  2.  Patient to report pain no greater than 2/10  Baseline:  Goal status: INITIAL  3.  Patient will be able to reach overhead into cabinets and on top of shelves without pain  Baseline:  Goal status: INITIAL  4.  Patient to be able to pick up usual daily items such as dishes, pots/pans, purse without pain Baseline:  Goal status: INITIAL  5.  Patient to be able to sleep through the night  Baseline:  Goal status: INITIAL  6.  Patient to report 85% improvement in overall symptoms  Baseline:  Goal status: INITIAL  PLAN: PT FREQUENCY: 1-2x/week  PT DURATION: 8 weeks  PLANNED INTERVENTIONS: Therapeutic exercises, Therapeutic activity, Neuromuscular re-education, Patient/Family education, Self Care,  Joint mobilization, Aquatic Therapy, Dry Needling, Electrical stimulation, Spinal mobilization, Cryotherapy, Moist heat, scar mobilization, Splintting, Taping, Vasopneumatic device, Traction, Ultrasound, Ionotophoresis 4mg /ml Dexamethasone, Manual therapy, and Re-evaluation  PLAN FOR NEXT SESSION: Progress HEP, UBE, postural exercises, eccentric biceps    Derreck Wiltsey B. Scherrie Seneca, PT 03/24/23 11:40 AM [facility] [address] [place], [place] [address] Phone # [phone] Fax [phone]

## 2023-03-31 ENCOUNTER — Ambulatory Visit: Payer: BC Managed Care – PPO | Attending: Family Medicine

## 2023-03-31 DIAGNOSIS — M6281 Muscle weakness (generalized): Secondary | ICD-10-CM | POA: Diagnosis present

## 2023-03-31 DIAGNOSIS — M25521 Pain in right elbow: Secondary | ICD-10-CM | POA: Insufficient documentation

## 2023-03-31 DIAGNOSIS — Z170 Estrogen receptor positive status [ER+]: Secondary | ICD-10-CM | POA: Insufficient documentation

## 2023-03-31 DIAGNOSIS — M25522 Pain in left elbow: Secondary | ICD-10-CM | POA: Insufficient documentation

## 2023-03-31 DIAGNOSIS — C50412 Malignant neoplasm of upper-outer quadrant of left female breast: Secondary | ICD-10-CM | POA: Insufficient documentation

## 2023-03-31 DIAGNOSIS — R252 Cramp and spasm: Secondary | ICD-10-CM | POA: Insufficient documentation

## 2023-03-31 NOTE — Therapy (Signed)
OUTPATIENT PHYSICAL THERAPY UPPER EXTREMITY EVALUATION   Patient Name: Davonna Round Vinzant MRN: [id number] DOB:1965-08-14, 57 y.o., female Today's Date: 03/31/2023  END OF SESSION:  PT End of Session - 03/31/23 1015     Visit Number 3    Date for PT Re-Evaluation 05/12/23    Authorization Type [employer] PPO    PT Start Time 1015    PT Stop Time 1100    PT Time Calculation (min) 45 min    Activity Tolerance Patient tolerated treatment well    Behavior During Therapy WFL for tasks assessed/performed             Past Medical History:  Diagnosis Date   Anxiety    GERD (gastroesophageal reflux disease)    Heart murmur    HTN (hypertension)    Hypertension    Migraine    Personal history of radiation therapy    Right ureteral stone    Wears contact lenses    Past Surgical History:  Procedure Laterality Date   BREAST BIOPSY Left 08/04/2021   BREAST LUMPECTOMY Left 09/05/2021   BREAST LUMPECTOMY WITH RADIOACTIVE SEED AND SENTINEL LYMPH NODE BIOPSY Left 09/05/2021   Procedure: LEFT BREAST LUMPECTOMY WITH RADIOACTIVE SEED AND SENTINEL LYMPH NODE BIOPSY;  Surgeon: Griselda Miner, MD;  Location: [facility];  Service: General;  Laterality: Left;   COLONOSCOPY  2015 approx   Patient Active Problem List   Diagnosis Date Noted   Heart murmur 05/05/2022   Monoallelic mutation of CDKN2A gene 09/08/2021   Genetic testing 08/25/2021   Family history of colon cancer 08/13/2021   Family history of ovarian cancer 08/13/2021   Malignant neoplasm of upper-outer quadrant of left breast in female, estrogen receptor positive (HCC) 08/11/2021   Hypertensive disorder 06/21/2019   Migraine 06/21/2019    PCP: [name], MD   REFERRING PROVIDER: Lenda Kelp, MD  REFERRING DIAG:  Diagnosis  M25.521,M25.522 (ICD-10-CM) - Pain of both elbows    THERAPY DIAG:  Pain in right elbow  Pain in left elbow  Muscle weakness (generalized)  Cramp and  spasm  Rationale for Evaluation and Treatment: Rehabilitation  ONSET DATE: 03/04/2023  SUBJECTIVE:                                                                                                                                                                                      SUBJECTIVE STATEMENT: Patient reports she had some return of some of the pain with getting reams of paper from an overhead cabinet and loading the copier.  She still gets some discomfort with end range biceps flexion as well.   She  reports her pain at 2/10 today.   Hand dominance: Right  PERTINENT HISTORY: Left breast lumpectomy with radiation  PAIN:  03/31/23 Are you having pain? Yes: NPRS scale: 2/10 Pain location: bilateral distal biceps Pain description: only with use, aching, sometimes sharp Aggravating factors: picking up something heavy Relieving factors: medication, nitro  patches  PRECAUTIONS: None  RED FLAGS: None   WEIGHT BEARING RESTRICTIONS: No  FALLS:  Has patient fallen in last 6 months? No  LIVING ENVIRONMENT: Lives with: lives with their family Lives in: House/apartment   OCCUPATION: Office support  PLOF: Independent, Independent with basic ADLs, Independent with household mobility without device, Independent with community mobility without device, Independent with homemaking with ambulation, Independent with gait, and Independent with transfers  PATIENT GOALS: She hopes to gain control over the pain and be able to do her usual level of activity.     NEXT MD VISIT: prn  OBJECTIVE:   DIAGNOSTIC FINDINGS: pt44 na  PATIENT SURVEYS :  FOTO 74, predicted 71 goal met  COGNITION: Overall cognitive status: Within functional limits for tasks assessed     SENSATION: WFL  POSTURE: Slightly rounded shoulders but overall good posture for the type work she does  UPPER EXTREMITY ROM:   WFL  UPPER EXTREMITY MMT:  All 5/5 with exception of bilateral shoulder ER 4/5.    SHOULDER SPECIAL TESTS: Impingement tests: Hawkins/Kennedy impingement test: negative  Rotator cuff assessment: Drop arm test: negative and Empty can test: negative Biceps assessment: Speed's test: positive   JOINT MOBILITY TESTING:  na  PALPATION:  Tender at distal biceps only   TODAY'S TREATMENT:                                                                                                                                         DATE: 03/31/23 UBE x 6 min fwd and reverse (3/3) only level 1 3 way scapular stabilization x 10 each UE with blue loop 4D ball rolls x 20 each UE Prone shoulder ext, rows and horizontal abduction with 3 lbs x 20 each Side lying shoulder ER x 20 with 3 lbs Supine serratus punch with 3 lbs x 20 Seated flex bar in/out x 20 Seated flex bar with arms outstretched x 20 (like motorcycle gear) both Seated wrist flexion, extension, supination/pronation with 3 lbs x 20 each UE Velcro board key turns with large roller x 3 laps each UE Eccentric bicep curls with 2 lbs x 10 each with 5 second lowering  DATE: 03/24/23 UBE x 4 min fwd only level 1 3 way scapular stabilization x 10 each UE 4D ball rolls x 20 each UE Seated flex bar in/out x 20 Seated flex bar with arms outstretched x 20 (like motorcycle gear) Eccentric bicep curls with 2 lbs x 10 each with 5 second lowering Prone shoulder ext, rows and horizontal abduction with 2 lbs x 20 each Side  lying shoulder ER x 20 with 2 lbs Supine serratus punch with 2 lbs x 20  DATE: 03/17/23 Initial eval completed and initiated HEP  PATIENT EDUCATION: Education details: Educated on relation of shoulder weakness on elbow pain and initiated HEP Person educated: Patient Education method: Programmer, multimedia, Facilities manager, Verbal cues, and Handouts Education comprehension: verbalized understanding, returned demonstration, and verbal cues required  HOME EXERCISE PROGRAM: Access Code: [email or web address] URL:  [email or web address] Date: 03/17/2023 Prepared by: Mikey Kirschner  Exercises - Prone Shoulder Extension - Single Arm  - 2 x daily - 7 x weekly - 2 sets - 10 reps - Prone Shoulder Row  - 2 x daily - 7 x weekly - 2 sets - 10 reps - Prone Single Arm Shoulder Horizontal Abduction with Scapular Retraction and Palm Down  - 2 x daily - 7 x weekly - 2 sets - 10 reps - Sidelying Shoulder External Rotation  - 2 x daily - 7 x weekly - 2 sets - 10 reps - Single Arm Serratus Punches in Supine with Dumbbell  - 2 x daily - 7 x weekly - 2 sets - 10 reps - Seated Alternating Bicep Curls with Rotation and Dumbbells  - 1 x daily - 7 x weekly - 3 sets - 10 reps  ASSESSMENT:  CLINICAL IMPRESSION: [name] had a minor recurrence of symptoms since last visit.  She was able to tolerate reverse motion on UBE and increased resistance on all shoulder exercises and eccentric bicep curls with no increased pain.  She is continuing the nitroglycerin patches at this time but we may switch to ionto next visit if she is still having symptoms.  She would benefit from continued skilled PT for bilateral shoulder stability, wrist, elbow and forearm strengthening and postural strengthening along with eccentric biceps loading.     OBJECTIVE IMPAIRMENTS: decreased ROM, decreased strength, increased fascial restrictions, increased muscle spasms, impaired flexibility, and pain.   ACTIVITY LIMITATIONS: carrying, lifting, sleeping, transfers, bed mobility, bathing, dressing, reach over head, and hygiene/grooming  PARTICIPATION LIMITATIONS: meal prep, cleaning, laundry, driving, shopping, occupation, and yard work  PERSONAL FACTORS: Careers information officer are also affecting patient's functional outcome.   REHAB POTENTIAL: Good  CLINICAL DECISION MAKING: Stable/uncomplicated  EVALUATION COMPLEXITY: Low  GOALS: Goals reviewed with patient? Yes  SHORT TERM GOALS: Target date: 04/14/2023   Pain report to be no greater  than 4/10  Baseline: Goal status: MET 03/24/23  2.  Patient will be independent with initial HEP  Baseline:  Goal status: MET 03/24/23  LONG TERM GOALS: Target date: 05/12/2023   Patient to be independent with advanced HEP  Baseline:  Goal status: INITIAL  2.  Patient to report pain no greater than 2/10  Baseline:  Goal status: INITIAL  3.  Patient will be able to reach overhead into cabinets and on top of shelves without pain  Baseline:  Goal status: INITIAL  4.  Patient to be able to pick up usual daily items such as dishes, pots/pans, purse without pain Baseline:  Goal status: INITIAL  5.  Patient to be able to sleep through the night  Baseline:  Goal status: INITIAL  6.  Patient to report 85% improvement in overall symptoms  Baseline:  Goal status: INITIAL  PLAN: PT FREQUENCY: 1-2x/week  PT DURATION: 8 weeks  PLANNED INTERVENTIONS: Therapeutic exercises, Therapeutic activity, Neuromuscular re-education, Patient/Family education, Self Care, Joint mobilization, Aquatic Therapy, Dry Needling, Electrical stimulation, Spinal mobilization, Cryotherapy, Moist heat, scar mobilization, Splintting, Taping, Vasopneumatic device, Traction, Ultrasound, Ionotophoresis  4mg /ml Dexamethasone, Manual therapy, and Re-evaluation  PLAN FOR NEXT SESSION: Continue to progress HEP, UBE, shoulder stabilization, postural exercises, eccentric biceps, possibly switch to ionto patch if symptoms persist.     Victorino Dike B. Omega Durante, PT 03/31/23 12:27 PM  [facility] [address] [place], [place] [address] Phone # [phone] Fax [phone]

## 2023-04-05 ENCOUNTER — Ambulatory Visit: Payer: BC Managed Care – PPO

## 2023-04-06 ENCOUNTER — Ambulatory Visit: Payer: BC Managed Care – PPO

## 2023-04-06 ENCOUNTER — Ambulatory Visit: Payer: BC Managed Care – PPO | Admitting: Physical Therapy

## 2023-04-06 DIAGNOSIS — M25521 Pain in right elbow: Secondary | ICD-10-CM | POA: Diagnosis not present

## 2023-04-06 DIAGNOSIS — R252 Cramp and spasm: Secondary | ICD-10-CM

## 2023-04-06 DIAGNOSIS — Z170 Estrogen receptor positive status [ER+]: Secondary | ICD-10-CM

## 2023-04-06 DIAGNOSIS — M25522 Pain in left elbow: Secondary | ICD-10-CM

## 2023-04-06 DIAGNOSIS — M6281 Muscle weakness (generalized): Secondary | ICD-10-CM

## 2023-04-06 NOTE — Therapy (Signed)
OUTPATIENT PHYSICAL THERAPY SOZO SCREENING NOTE   Patient Name: Wanda Castro MRN: [id number] DOB:Nov 06, 1965, 57 y.o., female Today's Date: 04/06/2023  PCP: [name], MD REFERRING PROVIDER: Lillard Anes Cornett*   PT End of Session - 04/06/23 1152     Visit Number 4   unchanged due to screen only   PT Start Time 1141    PT Stop Time 1142    PT Time Calculation (min) 1 min    Activity Tolerance Patient tolerated treatment well    Behavior During Therapy [facility] for tasks assessed/performed              Past Medical History:  Diagnosis Date   Anxiety    GERD (gastroesophageal reflux disease)    Heart murmur    HTN (hypertension)    Hypertension    Migraine    Personal history of radiation therapy    Right ureteral stone    Wears contact lenses    Past Surgical History:  Procedure Laterality Date   BREAST BIOPSY Left 08/04/2021   BREAST LUMPECTOMY Left 09/05/2021   BREAST LUMPECTOMY WITH RADIOACTIVE SEED AND SENTINEL LYMPH NODE BIOPSY Left 09/05/2021   Procedure: LEFT BREAST LUMPECTOMY WITH RADIOACTIVE SEED AND SENTINEL LYMPH NODE BIOPSY;  Surgeon: Griselda Miner, MD;  Location: [facility];  Service: General;  Laterality: Left;   COLONOSCOPY  2015 approx   Patient Active Problem List   Diagnosis Date Noted   Heart murmur 05/05/2022   Monoallelic mutation of CDKN2A gene 09/08/2021   Genetic testing 08/25/2021   Family history of colon cancer 08/13/2021   Family history of ovarian cancer 08/13/2021   Malignant neoplasm of upper-outer quadrant of left breast in female, estrogen receptor positive (HCC) 08/11/2021   Hypertensive disorder 06/21/2019   Migraine 06/21/2019    REFERRING DIAG: left breast cancer at risk for lymphedema  THERAPY DIAG: Malignant neoplasm of upper-outer quadrant of left breast in female, estrogen receptor positive (HCC)  PERTINENT HISTORY: Left lumpectomy and SLNB 09/05/21 with 0/3 LN positive. GERD, HTN,  migraines  PRECAUTIONS: left UE Lymphedema risk, None  SUBJECTIVE: Pt returns for her 3 month L-Dex screen.   PAIN:  Are you having pain? No  SOZO SCREENING: Patient was assessed today using the SOZO machine to determine the lymphedema index score. This was compared to her baseline score. It was determined that she is within the recommended range when compared to her baseline and no further action is needed at this time. She will continue SOZO screenings. These are done every 3 months for 2 years post operatively followed by every 6 months for 2 years, and then annually.   L-DEX FLOWSHEETS - 04/06/23 1100       L-DEX LYMPHEDEMA SCREENING   Measurement Type Unilateral    L-DEX MEASUREMENT EXTREMITY Upper Extremity    POSITION  Standing    DOMINANT SIDE Right    At Risk Side Left    BASELINE SCORE (UNILATERAL) -0.5    L-DEX SCORE (UNILATERAL) -1.6    VALUE CHANGE (UNILAT) -1.1    Comment Weight: 169.0lbs               [employer], PT 04/06/2023, 12:59 PM

## 2023-04-06 NOTE — Therapy (Signed)
OUTPATIENT PHYSICAL THERAPY UPPER EXTREMITY TREATMENT   Patient Name: Wanda Castro MRN: [id number] DOB:1966/03/03, 57 y.o., female Today's Date: 04/06/2023  END OF SESSION:  PT End of Session - 04/06/23 1149     Visit Number 4    Date for PT Re-Evaluation 05/12/23    Authorization Type [employer] PPO    PT Start Time 1147    PT Stop Time 1225    PT Time Calculation (min) 38 min    Activity Tolerance Patient tolerated treatment well    Behavior During Therapy WFL for tasks assessed/performed             Past Medical History:  Diagnosis Date   Anxiety    GERD (gastroesophageal reflux disease)    Heart murmur    HTN (hypertension)    Hypertension    Migraine    Personal history of radiation therapy    Right ureteral stone    Wears contact lenses    Past Surgical History:  Procedure Laterality Date   BREAST BIOPSY Left 08/04/2021   BREAST LUMPECTOMY Left 09/05/2021   BREAST LUMPECTOMY WITH RADIOACTIVE SEED AND SENTINEL LYMPH NODE BIOPSY Left 09/05/2021   Procedure: LEFT BREAST LUMPECTOMY WITH RADIOACTIVE SEED AND SENTINEL LYMPH NODE BIOPSY;  Surgeon: Griselda Miner, MD;  Location: [facility];  Service: General;  Laterality: Left;   COLONOSCOPY  2015 approx   Patient Active Problem List   Diagnosis Date Noted   Heart murmur 05/05/2022   Monoallelic mutation of CDKN2A gene 09/08/2021   Genetic testing 08/25/2021   Family history of colon cancer 08/13/2021   Family history of ovarian cancer 08/13/2021   Malignant neoplasm of upper-outer quadrant of left breast in female, estrogen receptor positive (HCC) 08/11/2021   Hypertensive disorder 06/21/2019   Migraine 06/21/2019    PCP: [name], MD   REFERRING PROVIDER: Lenda Kelp, MD  REFERRING DIAG:  Diagnosis  M25.521,M25.522 (ICD-10-CM) - Pain of both elbows    THERAPY DIAG:  Pain in right elbow  Pain in left elbow  Muscle weakness (generalized)  Cramp and  spasm  Rationale for Evaluation and Treatment: Rehabilitation  ONSET DATE: 03/04/2023  SUBJECTIVE:                                                                                                                                                                                      SUBJECTIVE STATEMENT: Patient reports she had some return of symptoms when she did some yard work and pulling up plants.  She explains that she didn't feel it while she was doing it but had pain the next day.  Minimal discomfort  today.    Hand dominance: Right  PERTINENT HISTORY: Left breast lumpectomy with radiation  PAIN:  03/31/23 Are you having pain? Yes: NPRS scale: 2/10 Pain location: bilateral distal biceps Pain description: only with use, aching, sometimes sharp Aggravating factors: picking up something heavy Relieving factors: medication, nitro  patches  PRECAUTIONS: None  RED FLAGS: None   WEIGHT BEARING RESTRICTIONS: No  FALLS:  Has patient fallen in last 6 months? No  LIVING ENVIRONMENT: Lives with: lives with their family Lives in: House/apartment   OCCUPATION: Office support  PLOF: Independent, Independent with basic ADLs, Independent with household mobility without device, Independent with community mobility without device, Independent with homemaking with ambulation, Independent with gait, and Independent with transfers  PATIENT GOALS: She hopes to gain control over the pain and be able to do her usual level of activity.     NEXT MD VISIT: prn  OBJECTIVE:   DIAGNOSTIC FINDINGS: pt44 na  PATIENT SURVEYS :  FOTO 74, predicted 71 goal met  COGNITION: Overall cognitive status: Within functional limits for tasks assessed     SENSATION: WFL  POSTURE: Slightly rounded shoulders but overall good posture for the type work she does  UPPER EXTREMITY ROM:   WFL  UPPER EXTREMITY MMT:  All 5/5 with exception of bilateral shoulder ER 4/5.   SHOULDER SPECIAL  TESTS: Impingement tests: Hawkins/Kennedy impingement test: negative  Rotator cuff assessment: Drop arm test: negative and Empty can test: negative Biceps assessment: Speed's test: positive   JOINT MOBILITY TESTING:  na  PALPATION:  Tender at distal biceps only   TODAY'S TREATMENT:                                                                                                                                         DATE: 04/06/23 UBE x 5 min fwd and reverse (3/3) only level 1 Eccentric biceps loading 2 x 10 traditional and 2 x 10 hammer curl with 5 lbs Standing shoulder flexion with 3 lbs x 20 Standing shoulder scaption with 3 lbs x 20 Seated with arms over bolster, IASTM to bilateral distal biceps x 5 min each along with manual STM Ionto patch to each distal biceps tendon with 4 hour wear time/dexamethasone.  (Patient had nitroglycerin patches in place.  Had her remove these and cleaned area with alcohol swab)  DATE: 03/31/23 UBE x 6 min fwd and reverse (3/3) only level 1 3 way scapular stabilization x 10 each UE with blue loop 4D ball rolls x 20 each UE Prone shoulder ext, rows and horizontal abduction with 3 lbs x 20 each Side lying shoulder ER x 20 with 3 lbs Supine serratus punch with 3 lbs x 20 Seated flex bar in/out x 20 Seated flex bar with arms outstretched x 20 (like motorcycle gear) both Seated wrist flexion, extension, supination/pronation with 3 lbs x 20 each UE Velcro board key turns with  large roller x 3 laps each UE Eccentric bicep curls with 2 lbs x 10 each with 5 second lowering  DATE: 03/24/23 UBE x 4 min fwd only level 1 3 way scapular stabilization x 10 each UE 4D ball rolls x 20 each UE Seated flex bar in/out x 20 Seated flex bar with arms outstretched x 20 (like motorcycle gear) Eccentric bicep curls with 2 lbs x 10 each with 5 second lowering Prone shoulder ext, rows and horizontal abduction with 2 lbs x 20 each Side lying shoulder ER x 20 with 2  lbs Supine serratus punch with 2 lbs x 20  DATE: 03/17/23 Initial eval completed and initiated HEP  PATIENT EDUCATION: Education details: Educated on relation of shoulder weakness on elbow pain and initiated HEP Person educated: Patient Education method: Programmer, multimedia, Facilities manager, Verbal cues, and Handouts Education comprehension: verbalized understanding, returned demonstration, and verbal cues required  HOME EXERCISE PROGRAM: Access Code: [email or web address] URL: [email or web address] Date: 03/17/2023 Prepared by: Mikey Kirschner  Exercises - Prone Shoulder Extension - Single Arm  - 2 x daily - 7 x weekly - 2 sets - 10 reps - Prone Shoulder Row  - 2 x daily - 7 x weekly - 2 sets - 10 reps - Prone Single Arm Shoulder Horizontal Abduction with Scapular Retraction and Palm Down  - 2 x daily - 7 x weekly - 2 sets - 10 reps - Sidelying Shoulder External Rotation  - 2 x daily - 7 x weekly - 2 sets - 10 reps - Single Arm Serratus Punches in Supine with Dumbbell  - 2 x daily - 7 x weekly - 2 sets - 10 reps - Seated Alternating Bicep Curls with Rotation and Dumbbells  - 1 x daily - 7 x weekly - 3 sets - 10 reps  ASSESSMENT:  CLINICAL IMPRESSION: [name] had return of symptoms with yard work.  She states that this was on Monday and that she is feeling a little better now.  We increased resistance on eccentric biceps as well as adding hammer curl eccentric loading.  She was able to do this with no pain.  We also added standing shoulder flexion and scaption.  She did have some mild discomfort with this.  She is compliant and well motivated.  We added iontophoresis today.  We will assess and progress according to symptoms.  She would benefit from continuing skilled PT for upper body strength and eccentric biceps loading along with symptom control.  Possibly dry needling next visit if no change in symptoms.       OBJECTIVE IMPAIRMENTS: decreased ROM, decreased strength, increased fascial  restrictions, increased muscle spasms, impaired flexibility, and pain.   ACTIVITY LIMITATIONS: carrying, lifting, sleeping, transfers, bed mobility, bathing, dressing, reach over head, and hygiene/grooming  PARTICIPATION LIMITATIONS: meal prep, cleaning, laundry, driving, shopping, occupation, and yard work  PERSONAL FACTORS: Careers information officer are also affecting patient's functional outcome.   REHAB POTENTIAL: Good  CLINICAL DECISION MAKING: Stable/uncomplicated  EVALUATION COMPLEXITY: Low  GOALS: Goals reviewed with patient? Yes  SHORT TERM GOALS: Target date: 04/14/2023   Pain report to be no greater than 4/10  Baseline: Goal status: MET 03/24/23  2.  Patient will be independent with initial HEP  Baseline:  Goal status: MET 03/24/23  LONG TERM GOALS: Target date: 05/12/2023   Patient to be independent with advanced HEP  Baseline:  Goal status: INITIAL  2.  Patient to report pain no greater than 2/10  Baseline:  Goal status: INITIAL  3.  Patient will be able to reach overhead into cabinets and on top of shelves without pain  Baseline:  Goal status: INITIAL  4.  Patient to be able to pick up usual daily items such as dishes, pots/pans, purse without pain Baseline:  Goal status: INITIAL  5.  Patient to be able to sleep through the night  Baseline:  Goal status: INITIAL  6.  Patient to report 85% improvement in overall symptoms  Baseline:  Goal status: INITIAL  PLAN: PT FREQUENCY: 1-2x/week  PT DURATION: 8 weeks  PLANNED INTERVENTIONS: Therapeutic exercises, Therapeutic activity, Neuromuscular re-education, Patient/Family education, Self Care, Joint mobilization, Aquatic Therapy, Dry Needling, Electrical stimulation, Spinal mobilization, Cryotherapy, Moist heat, scar mobilization, Splintting, Taping, Vasopneumatic device, Traction, Ultrasound, Ionotophoresis 4mg /ml Dexamethasone, Manual therapy, and Re-evaluation  PLAN FOR NEXT SESSION: Assess response to  IASTM and ionto patch #1.  Continue to progress HEP, UBE, shoulder stabilization, postural exercises, eccentric biceps, symptoms persist.   Ionto #2 if first treatment was effective.     Victorino Dike B. Chancellor Vanderloop, PT 04/06/23 1:50 PM [facility] [address], [address] [place], [place] [address] Phone # [phone] Fax [phone]

## 2023-04-12 IMAGING — MG MM BREAST SURGICAL SPECIMEN
1 series · 2 of 2 positions shown · non-contrast
Comparison: Previous exam(s).

CLINICAL DATA: Status post lumpectomy today after earlier
radioactive seed localization.

EXAM:
SPECIMEN RADIOGRAPH OF THE LEFT BREAST

[Series 1: L · left · 0.07mm/px · 2 of 2 slices shown]
[im 1/2]
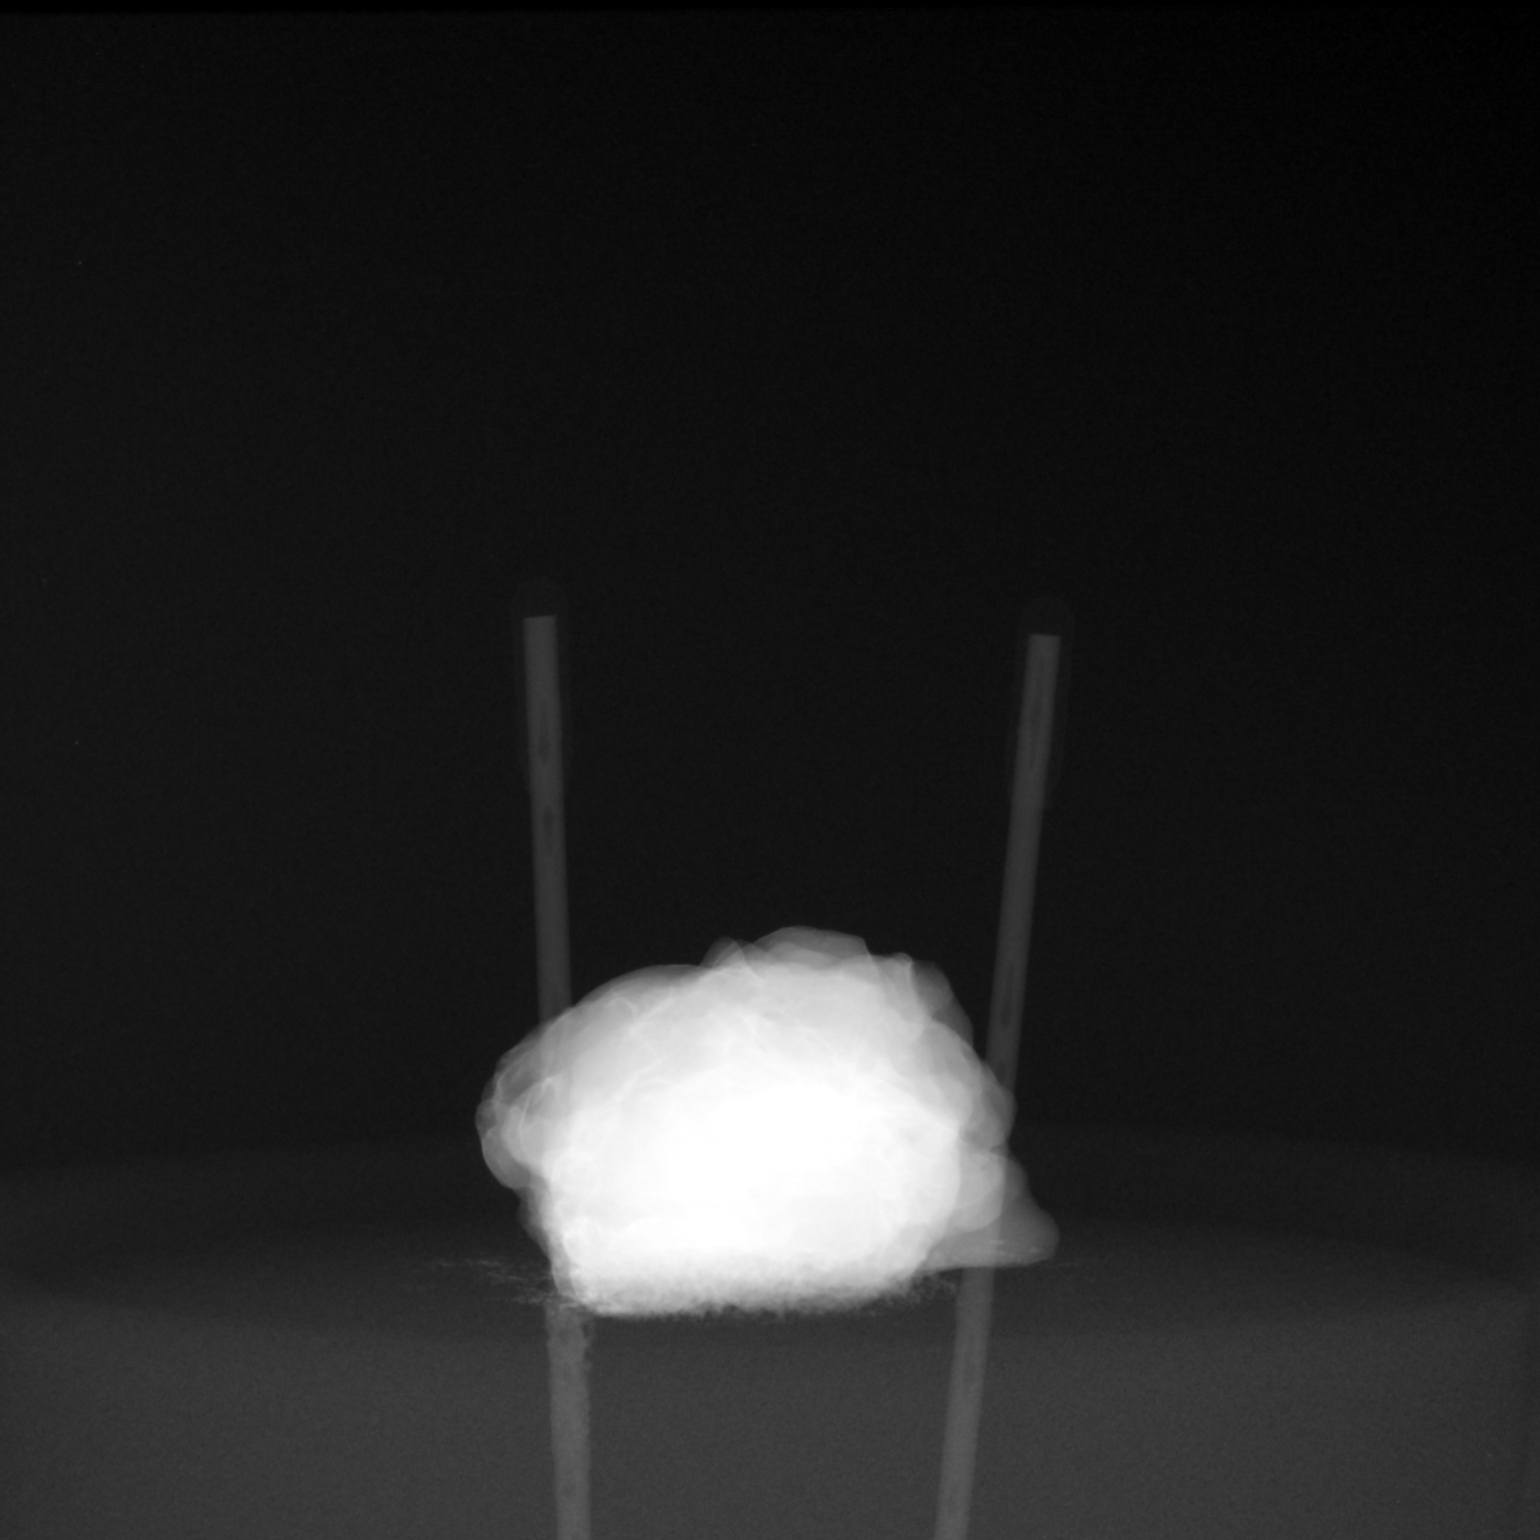
[im 2/2]
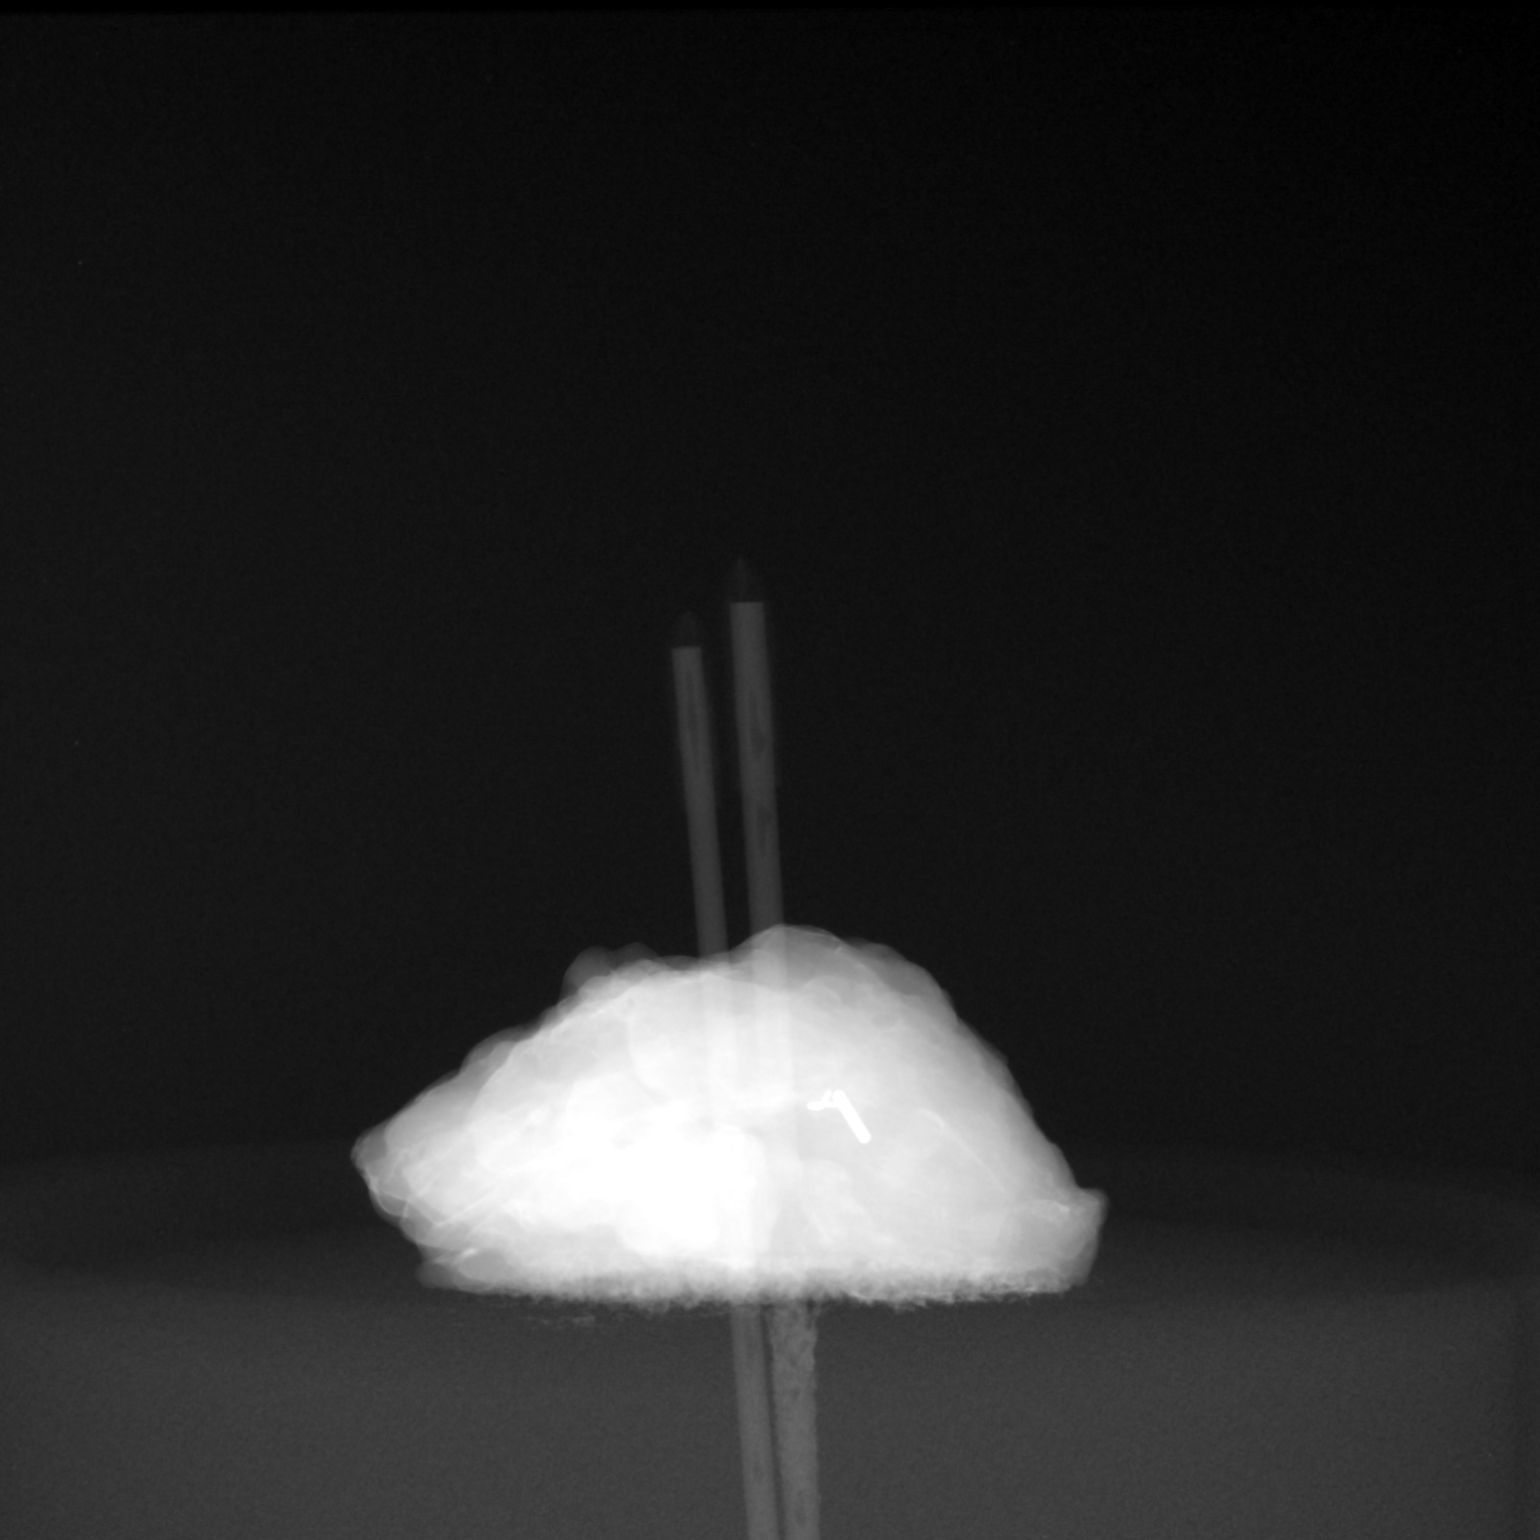

[2 of 2 positions shown; findings below may reference images not displayed]

FINDINGS: Status post excision of the left breast. The radioactive seed and
biopsy marker clip are present and completely intact within the
specimen. Findings discussed with the OR staff during the procedure.
IMPRESSION: Specimen radiograph of the left breast.

## 2023-04-14 ENCOUNTER — Ambulatory Visit (INDEPENDENT_AMBULATORY_CARE_PROVIDER_SITE_OTHER): Payer: BC Managed Care – PPO | Admitting: Family Medicine

## 2023-04-14 ENCOUNTER — Encounter: Payer: Self-pay | Admitting: Family Medicine

## 2023-04-14 ENCOUNTER — Ambulatory Visit: Payer: BC Managed Care – PPO | Attending: Family Medicine

## 2023-04-14 VITALS — BP 127/64 | Ht 63.0 in | Wt 168.0 lb

## 2023-04-14 DIAGNOSIS — M25521 Pain in right elbow: Secondary | ICD-10-CM

## 2023-04-14 DIAGNOSIS — M25522 Pain in left elbow: Secondary | ICD-10-CM | POA: Insufficient documentation

## 2023-04-14 DIAGNOSIS — M6281 Muscle weakness (generalized): Secondary | ICD-10-CM | POA: Diagnosis present

## 2023-04-14 DIAGNOSIS — R252 Cramp and spasm: Secondary | ICD-10-CM | POA: Diagnosis present

## 2023-04-14 NOTE — Therapy (Signed)
OUTPATIENT PHYSICAL THERAPY UPPER EXTREMITY TREATMENT   Patient Name: Wanda Castro MRN: [id number] DOB:[birth year], 57 y.o., female Today's Date: 04/14/2023  END OF SESSION:  PT End of Session - 04/14/23 1023     Visit Number 5    Date for PT Re-Evaluation 05/12/23    Authorization Type [employer] PPO    PT Start Time 1018    PT Stop Time 1100    PT Time Calculation (min) 42 min    Activity Tolerance Patient tolerated treatment well    Behavior During Therapy WFL for tasks assessed/performed             Past Medical History:  Diagnosis Date   Anxiety    GERD (gastroesophageal reflux disease)    Heart murmur    HTN (hypertension)    Hypertension    Migraine    Personal history of radiation therapy    Right ureteral stone    Wears contact lenses    Past Surgical History:  Procedure Laterality Date   BREAST BIOPSY Left 08/04/2021   BREAST LUMPECTOMY Left 09/05/2021   BREAST LUMPECTOMY WITH RADIOACTIVE SEED AND SENTINEL LYMPH NODE BIOPSY Left 09/05/2021   Procedure: LEFT BREAST LUMPECTOMY WITH RADIOACTIVE SEED AND SENTINEL LYMPH NODE BIOPSY;  Surgeon: Griselda Miner, MD;  Location: [facility];  Service: General;  Laterality: Left;   COLONOSCOPY  2015 approx   Patient Active Problem List   Diagnosis Date Noted   Heart murmur 05/05/2022   Monoallelic mutation of CDKN2A gene 09/08/2021   Genetic testing 08/25/2021   Family history of colon cancer 08/13/2021   Family history of ovarian cancer 08/13/2021   Malignant neoplasm of upper-outer quadrant of left breast in female, estrogen receptor positive (HCC) 08/11/2021   Hypertensive disorder 06/21/2019   Migraine 06/21/2019    PCP: [name], MD   REFERRING PROVIDER: Lenda Kelp, MD  REFERRING DIAG:  Diagnosis  M25.521,M25.522 (ICD-10-CM) - Pain of both elbows    THERAPY DIAG:  Cramp and spasm  Muscle weakness (generalized)  Pain in left elbow  Pain in right  elbow  Rationale for Evaluation and Treatment: Rehabilitation  ONSET DATE: 03/04/2023  SUBJECTIVE:                                                                                                                                                                                      SUBJECTIVE STATEMENT: Patient reports she had increased pain after last visit.  She felt this could have been the "scraping" (IASTM).     Hand dominance: Right  PERTINENT HISTORY: Left breast lumpectomy with radiation  PAIN:  03/31/23 Are you  having pain? Yes: NPRS scale: 2/10 Pain location: bilateral distal biceps Pain description: only with use, aching, sometimes sharp Aggravating factors: picking up something heavy Relieving factors: medication, nitro  patches  PRECAUTIONS: None  RED FLAGS: None   WEIGHT BEARING RESTRICTIONS: No  FALLS:  Has patient fallen in last 6 months? No  LIVING ENVIRONMENT: Lives with: lives with their family Lives in: House/apartment   OCCUPATION: Office support  PLOF: Independent, Independent with basic ADLs, Independent with household mobility without device, Independent with community mobility without device, Independent with homemaking with ambulation, Independent with gait, and Independent with transfers  PATIENT GOALS: She hopes to gain control over the pain and be able to do her usual level of activity.     NEXT MD VISIT: prn  OBJECTIVE:   DIAGNOSTIC FINDINGS: pt44 na  PATIENT SURVEYS :  FOTO 74, predicted 71 goal met  COGNITION: Overall cognitive status: Within functional limits for tasks assessed     SENSATION: WFL  POSTURE: Slightly rounded shoulders but overall good posture for the type work she does  UPPER EXTREMITY ROM:   WFL  UPPER EXTREMITY MMT:  All 5/5 with exception of bilateral shoulder ER 4/5.   SHOULDER SPECIAL TESTS: Impingement tests: Hawkins/Kennedy impingement test: negative  Rotator cuff assessment: Drop arm test:  negative and Empty can test: negative Biceps assessment: Speed's test: positive   JOINT MOBILITY TESTING:  na  PALPATION:  Tender at distal biceps only   TODAY'S TREATMENT:                                                                                                                                         DATE:04/14/23 UBE x 6 min fwd and reverse (3/3) only level 1 Ball squeezes (orange grip ball) x 20 each hand Thumb extension x 20 with orange gripper  Wrist extension x 5 each UE holding 10 sec each Supination stretch x 5 each UE holding 10 sec each Pronation stretch x 5 each UE holding 10 sec each Eccentric biceps loading 2 x 10 traditional with 3 lbs Forearm supination/pronation x 20 with 3 lbs Wrist flexion and extension with 3 lbs 2 x 10 each UE Ionto patch #2 to each distal biceps tendon with 4 hour wear time/dexamethasone.  (Patient had nitroglycerin patches in place.  Had her remove these and cleaned area with alcohol swab)  DATE: 04/06/23 UBE x 5 min fwd and reverse (3/3) only level 1 Eccentric biceps loading 2 x 10 traditional and 2 x 10 hammer curl with 5 lbs Standing shoulder flexion with 3 lbs x 20 Standing shoulder scaption with 3 lbs x 20 Seated with arms over bolster, IASTM to bilateral distal biceps x 5 min each along with manual STM Ionto patch to each distal biceps tendon with 4 hour wear time/dexamethasone.  (Patient had nitroglycerin patches in place.  Had her remove these and cleaned area with  alcohol swab)  DATE: 03/31/23 UBE x 6 min fwd and reverse (3/3) only level 1 3 way scapular stabilization x 10 each UE with blue loop 4D ball rolls x 20 each UE Prone shoulder ext, rows and horizontal abduction with 3 lbs x 20 each Side lying shoulder ER x 20 with 3 lbs Supine serratus punch with 3 lbs x 20 Seated flex bar in/out x 20 Seated flex bar with arms outstretched x 20 (like motorcycle gear) both Seated wrist flexion, extension, supination/pronation with 3  lbs x 20 each UE Velcro board key turns with large roller x 3 laps each UE Eccentric bicep curls with 2 lbs x 10 each with 5 second lowering  DATE: 03/17/23 Initial eval completed and initiated HEP  PATIENT EDUCATION: Education details: Educated on relation of shoulder weakness on elbow pain and initiated HEP Person educated: Patient Education method: Programmer, multimedia, Facilities manager, Verbal cues, and Handouts Education comprehension: verbalized understanding, returned demonstration, and verbal cues required  HOME EXERCISE PROGRAM: Access Code: [email or web address] URL: [email or web address] Date: 03/17/2023 Prepared by: Mikey Kirschner  Exercises - Prone Shoulder Extension - Single Arm  - 2 x daily - 7 x weekly - 2 sets - 10 reps - Prone Shoulder Row  - 2 x daily - 7 x weekly - 2 sets - 10 reps - Prone Single Arm Shoulder Horizontal Abduction with Scapular Retraction and Palm Down  - 2 x daily - 7 x weekly - 2 sets - 10 reps - Sidelying Shoulder External Rotation  - 2 x daily - 7 x weekly - 2 sets - 10 reps - Single Arm Serratus Punches in Supine with Dumbbell  - 2 x daily - 7 x weekly - 2 sets - 10 reps - Seated Alternating Bicep Curls with Rotation and Dumbbells  - 1 x daily - 7 x weekly - 3 sets - 10 reps  ASSESSMENT:  CLINICAL IMPRESSION: [name] had increased symptoms after IASTM.  She was able to do all tasks today with no increase in pain.  We focused on stretching, primarily, along with eccentric biceps with decreased weight and wrist and forearm strengthening.  We proceeded with ionto patch #2.  We will assess results at next appt and proceed accordingly.    OBJECTIVE IMPAIRMENTS: decreased ROM, decreased strength, increased fascial restrictions, increased muscle spasms, impaired flexibility, and pain.   ACTIVITY LIMITATIONS: carrying, lifting, sleeping, transfers, bed mobility, bathing, dressing, reach over head, and hygiene/grooming  PARTICIPATION LIMITATIONS: meal prep,  cleaning, laundry, driving, shopping, occupation, and yard work  PERSONAL FACTORS: Careers information officer are also affecting patient's functional outcome.   REHAB POTENTIAL: Good  CLINICAL DECISION MAKING: Stable/uncomplicated  EVALUATION COMPLEXITY: Low  GOALS: Goals reviewed with patient? Yes  SHORT TERM GOALS: Target date: 04/14/2023   Pain report to be no greater than 4/10  Baseline: Goal status: MET 03/24/23  2.  Patient will be independent with initial HEP  Baseline:  Goal status: MET 03/24/23  LONG TERM GOALS: Target date: 05/12/2023   Patient to be independent with advanced HEP  Baseline:  Goal status: INITIAL  2.  Patient to report pain no greater than 2/10  Baseline:  Goal status: INITIAL  3.  Patient will be able to reach overhead into cabinets and on top of shelves without pain  Baseline:  Goal status: INITIAL  4.  Patient to be able to pick up usual daily items such as dishes, pots/pans, purse without pain Baseline:  Goal status: INITIAL  5.  Patient to be  able to sleep through the night  Baseline:  Goal status: INITIAL  6.  Patient to report 85% improvement in overall symptoms  Baseline:  Goal status: INITIAL  PLAN: PT FREQUENCY: 1-2x/week  PT DURATION: 8 weeks  PLANNED INTERVENTIONS: Therapeutic exercises, Therapeutic activity, Neuromuscular re-education, Patient/Family education, Self Care, Joint mobilization, Aquatic Therapy, Dry Needling, Electrical stimulation, Spinal mobilization, Cryotherapy, Moist heat, scar mobilization, Splintting, Taping, Vasopneumatic device, Traction, Ultrasound, Ionotophoresis 4mg /ml Dexamethasone, Manual therapy, and Re-evaluation  PLAN FOR NEXT SESSION: Assess response to today's treatment and ionto patch #2.  Continue to progress HEP, UBE, shoulder stabilization, postural exercises, eccentric biceps, symptoms persist.   Ionto #3 if first treatment was effective.     Victorino Dike B. Lenola Lockner, PT 04/14/23 2:56  PM [facility] [address] [place], [place] [address] Phone # [phone] Fax [phone]

## 2023-04-14 NOTE — Progress Notes (Signed)
PCP: [name], MD  Subjective:   HPI: Patient is a 57 y.o. female here for bilateral elbow pain.  8/22: Pain started in January 2024.  At that time she followed-up with her oncologist, she is in remission from breast cancer currently taking anastrozole.  Oncology discontinued anastrozole for 2 weeks, which did not improve pain.  Afterward she was prescribed a short prednisone course, which provided minor benefit.  She was then sent to physical therapy, completed 2-3 visits with physical therapy and discontinued because she felt like it was not helping.  Today, she is not in pain at rest.  She says pain is primarily after lifting and moving arms.  Sometimes pain and stiffness is worse in the morning, but not consistently.  Denies weakness.  Having mild intermittent right shoulder pain, but no pain elsewhere in the body.  She takes Goody's powder and ibuprofen intermittently which improves pain.  10/2: Patient reports she's about 30% improved compared to last visit. Using nitroglycerin patches 1/4th to each side - had regular headaches that improved over a week.  Now only occasionally gets them. Has done 5 visits of physical therapy and doing home exercises. Some mild pain if she does a lot of gardening.  Past Medical History:  Diagnosis Date   Anxiety    GERD (gastroesophageal reflux disease)    Heart murmur    HTN (hypertension)    Hypertension    Migraine    Personal history of radiation therapy    Right ureteral stone    Wears contact lenses     Current Outpatient Medications on File Prior to Visit  Medication Sig Dispense Refill   ALPRAZolam (XANAX) 0.25 MG tablet Take 0.25 mg by mouth at bedtime as needed for anxiety.     anastrozole (ARIMIDEX) 1 MG tablet TAKE 1 TABLET BY MOUTH EVERY DAY 90 tablet 0   Calcium Carb-Cholecalciferol (CALCIUM 600 + D PO) Take 1 tablet by mouth daily.     losartan-hydrochlorothiazide (HYZAAR) 100-12.5 MG tablet Take by mouth daily.      methylPREDNISolone (MEDROL DOSEPAK) 4 MG TBPK tablet Taper 6,5,4,3,2,1 21 tablet 0   mometasone (ELOCON) 0.1 % cream mometasone 0.1 % topical cream  APPLY TO EXTERNAL EAR ONCE DAILY AS NEEDED ITCHING     nitroGLYCERIN (NITRODUR - DOSED IN MG/24 HR) 0.2 mg/hr patch Apply 1/4th patch to affected elbows, change daily 30 patch 3   Pyridoxine HCl (VITAMIN B6) 100 MG TABS Take 100 mg by mouth daily.     No current facility-administered medications on file prior to visit.    Past Surgical History:  Procedure Laterality Date   BREAST BIOPSY Left 08/04/2021   BREAST LUMPECTOMY Left 09/05/2021   BREAST LUMPECTOMY WITH RADIOACTIVE SEED AND SENTINEL LYMPH NODE BIOPSY Left 09/05/2021   Procedure: LEFT BREAST LUMPECTOMY WITH RADIOACTIVE SEED AND SENTINEL LYMPH NODE BIOPSY;  Surgeon: Griselda Miner, MD;  Location: [facility];  Service: General;  Laterality: Left;   COLONOSCOPY  2015 approx    Allergies  Allergen Reactions   Adhesive [Tape] Other (See Comments)    Red irritated skin    BP 127/64   Ht 5\' 3"  (1.6 m)   Wt 168 lb (76.2 kg)   BMI 29.76 kg/m       No data to display              No data to display              Objective:  Physical Exam:  Gen: NAD, comfortable in exam room  Bilateral elbows: No deformity. FROM with 5/5 strength - mild pain with supination. No tenderness to palpation. NVI distally. Collateral ligaments intact.   Assessment & Plan:  1. Bilateral distal biceps tendinopathy - slow improvement.  Continue with physical therapy, home exercises, nitroglycerin patches.  F/u in 6-8 weeks.

## 2023-04-17 ENCOUNTER — Other Ambulatory Visit: Payer: Self-pay | Admitting: Hematology

## 2023-04-28 ENCOUNTER — Ambulatory Visit: Payer: BC Managed Care – PPO | Attending: Family Medicine

## 2023-04-28 DIAGNOSIS — M25522 Pain in left elbow: Secondary | ICD-10-CM | POA: Diagnosis present

## 2023-04-28 DIAGNOSIS — R252 Cramp and spasm: Secondary | ICD-10-CM | POA: Insufficient documentation

## 2023-04-28 DIAGNOSIS — M25521 Pain in right elbow: Secondary | ICD-10-CM | POA: Diagnosis present

## 2023-04-28 DIAGNOSIS — M6281 Muscle weakness (generalized): Secondary | ICD-10-CM | POA: Insufficient documentation

## 2023-04-28 NOTE — Therapy (Signed)
OUTPATIENT PHYSICAL THERAPY UPPER EXTREMITY TREATMENT   Patient Name: Wanda Castro MRN: [id number] DOB:01/10/1966, 57 y.o., female Today's Date: 04/28/2023  END OF SESSION:  PT End of Session - 04/28/23 1313     Visit Number 6    Date for PT Re-Evaluation 05/12/23    Authorization Type [employer] PPO    PT Start Time 1145    PT Stop Time 1230    PT Time Calculation (min) 45 min    Activity Tolerance Patient tolerated treatment well    Behavior During Therapy WFL for tasks assessed/performed              Past Medical History:  Diagnosis Date   Anxiety    GERD (gastroesophageal reflux disease)    Heart murmur    HTN (hypertension)    Hypertension    Migraine    Personal history of radiation therapy    Right ureteral stone    Wears contact lenses    Past Surgical History:  Procedure Laterality Date   BREAST BIOPSY Left 08/04/2021   BREAST LUMPECTOMY Left 09/05/2021   BREAST LUMPECTOMY WITH RADIOACTIVE SEED AND SENTINEL LYMPH NODE BIOPSY Left 09/05/2021   Procedure: LEFT BREAST LUMPECTOMY WITH RADIOACTIVE SEED AND SENTINEL LYMPH NODE BIOPSY;  Surgeon: Griselda Miner, MD;  Location: [facility];  Service: General;  Laterality: Left;   COLONOSCOPY  2015 approx   Patient Active Problem List   Diagnosis Date Noted   Heart murmur 05/05/2022   Monoallelic mutation of CDKN2A gene 09/08/2021   Genetic testing 08/25/2021   Family history of colon cancer 08/13/2021   Family history of ovarian cancer 08/13/2021   Malignant neoplasm of upper-outer quadrant of left breast in female, estrogen receptor positive (HCC) 08/11/2021   Hypertensive disorder 06/21/2019   Migraine 06/21/2019    PCP: [name], MD   REFERRING PROVIDER: Lenda Kelp, MD  REFERRING DIAG:  Diagnosis  M25.521,M25.522 (ICD-10-CM) - Pain of both elbows    THERAPY DIAG:  Cramp and spasm  Muscle weakness (generalized)  Pain in left elbow  Pain in right  elbow  Rationale for Evaluation and Treatment: Rehabilitation  ONSET DATE: 03/04/2023  SUBJECTIVE:                                                                                                                                                                                      SUBJECTIVE STATEMENT: Patient reports she has been doing better.  She had an issue with her back last week and could not come in for her elbows.   Hand dominance: Right  PERTINENT HISTORY: Left breast lumpectomy with radiation  PAIN:  04/28/23 Are you having pain? Yes: NPRS scale: 2/10 Pain location: bilateral distal biceps Pain description: only with use, aching, sometimes sharp Aggravating factors: picking up something heavy Relieving factors: medication, nitro  patches  PRECAUTIONS: None  RED FLAGS: None   WEIGHT BEARING RESTRICTIONS: No  FALLS:  Has patient fallen in last 6 months? No  LIVING ENVIRONMENT: Lives with: lives with their family Lives in: House/apartment   OCCUPATION: Office support  PLOF: Independent, Independent with basic ADLs, Independent with household mobility without device, Independent with community mobility without device, Independent with homemaking with ambulation, Independent with gait, and Independent with transfers  PATIENT GOALS: She hopes to gain control over the pain and be able to do her usual level of activity.     NEXT MD VISIT: prn  OBJECTIVE:   DIAGNOSTIC FINDINGS: pt44 na  PATIENT SURVEYS :  FOTO 74, predicted 71 goal met  COGNITION: Overall cognitive status: Within functional limits for tasks assessed     SENSATION: WFL  POSTURE: Slightly rounded shoulders but overall good posture for the type work she does  UPPER EXTREMITY ROM:   WFL  UPPER EXTREMITY MMT:  All 5/5 with exception of bilateral shoulder ER 4/5.   SHOULDER SPECIAL TESTS: Impingement tests: Hawkins/Kennedy impingement test: negative  Rotator cuff assessment: Drop  arm test: negative and Empty can test: negative Biceps assessment: Speed's test: positive   JOINT MOBILITY TESTING:  na  PALPATION:  Tender at distal biceps only   TODAY'S TREATMENT:                                                                                                                                         DATE:04/28/23 UBE x 6 min fwd and reverse (3/3) only level 1 3 way scapular stabilization with yellow loop x 5 each side 4 D ball rolls with yellow plyo ball x 20 each UE Ball squeezes (orange grip ball) x 20 each hand Thumb extension x 20 with orange gripper  Wrist extension x 5 each UE holding 10 sec each Supination stretch x 5 each UE holding 10 sec each Pronation stretch x 5 each UE holding 10 sec each Eccentric biceps loading 2 x 10 traditional with 3 lbs Forearm supination/pronation x 20 with 3 lbs Wrist flexion and extension with 3 lbs 2 x 10 each UE Ionto patch #2 to each distal biceps tendon with 4 hour wear time/dexamethasone.  (Patient had nitroglycerin patches in place.  Had her remove these and cleaned area with alcohol swab)  DATE:04/14/23 UBE x 6 min fwd and reverse (3/3) only level 1 Ball squeezes (orange grip ball) x 20 each hand Thumb extension x 20 with orange gripper  Wrist extension x 5 each UE holding 10 sec each Supination stretch x 5 each UE holding 10 sec each Pronation stretch x 5 each UE holding 10 sec each Eccentric biceps loading 2  x 10 traditional with 3 lbs Forearm supination/pronation x 20 with 3 lbs Wrist flexion and extension with 3 lbs 2 x 10 each UE Ionto patch #2 to each distal biceps tendon with 4 hour wear time/dexamethasone.  (Patient had nitroglycerin patches in place.  Had her remove these and cleaned area with alcohol swab)  DATE: 04/06/23 UBE x 5 min fwd and reverse (3/3) only level 1 Eccentric biceps loading 2 x 10 traditional and 2 x 10 hammer curl with 5 lbs Standing shoulder flexion with 3 lbs x 20 Standing shoulder  scaption with 3 lbs x 20 Seated with arms over bolster, IASTM to bilateral distal biceps x 5 min each along with manual STM Ionto patch to each distal biceps tendon with 4 hour wear time/dexamethasone.  (Patient had nitroglycerin patches in place.  Had her remove these and cleaned area with alcohol swab)  DATE: 03/17/23 Initial eval completed and initiated HEP  PATIENT EDUCATION: Education details: Educated on relation of shoulder weakness on elbow pain and initiated HEP Person educated: Patient Education method: Programmer, multimedia, Facilities manager, Verbal cues, and Handouts Education comprehension: verbalized understanding, returned demonstration, and verbal cues required  HOME EXERCISE PROGRAM: Access Code: [email or web address] URL: [email or web address] Date: 03/17/2023 Prepared by: Mikey Kirschner  Exercises - Prone Shoulder Extension - Single Arm  - 2 x daily - 7 x weekly - 2 sets - 10 reps - Prone Shoulder Row  - 2 x daily - 7 x weekly - 2 sets - 10 reps - Prone Single Arm Shoulder Horizontal Abduction with Scapular Retraction and Palm Down  - 2 x daily - 7 x weekly - 2 sets - 10 reps - Sidelying Shoulder External Rotation  - 2 x daily - 7 x weekly - 2 sets - 10 reps - Single Arm Serratus Punches in Supine with Dumbbell  - 2 x daily - 7 x weekly - 2 sets - 10 reps - Seated Alternating Bicep Curls with Rotation and Dumbbells  - 1 x daily - 7 x weekly - 3 sets - 10 reps  ASSESSMENT:  CLINICAL IMPRESSION: [name] is beginning to respond well to current treatment approach.  We are focusing on wrist strength and mobility to reduce stress to the elbows.  She is tolerating increased resistance without increased pain.    We proceeded with ionto patch #3.  She should continue to do well.  She would benefit from skilled PT for wrist flexibility and strengthening along with shoulder strengthening and stability training.    OBJECTIVE IMPAIRMENTS: decreased ROM, decreased strength, increased fascial  restrictions, increased muscle spasms, impaired flexibility, and pain.   ACTIVITY LIMITATIONS: carrying, lifting, sleeping, transfers, bed mobility, bathing, dressing, reach over head, and hygiene/grooming  PARTICIPATION LIMITATIONS: meal prep, cleaning, laundry, driving, shopping, occupation, and yard work  PERSONAL FACTORS: Careers information officer are also affecting patient's functional outcome.   REHAB POTENTIAL: Good  CLINICAL DECISION MAKING: Stable/uncomplicated  EVALUATION COMPLEXITY: Low  GOALS: Goals reviewed with patient? Yes  SHORT TERM GOALS: Target date: 04/14/2023   Pain report to be no greater than 4/10  Baseline: Goal status: MET 03/24/23  2.  Patient will be independent with initial HEP  Baseline:  Goal status: MET 03/24/23  LONG TERM GOALS: Target date: 05/12/2023   Patient to be independent with advanced HEP  Baseline:  Goal status: INITIAL  2.  Patient to report pain no greater than 2/10  Baseline:  Goal status: INITIAL  3.  Patient will be able to reach overhead into cabinets  and on top of shelves without pain  Baseline:  Goal status: INITIAL  4.  Patient to be able to pick up usual daily items such as dishes, pots/pans, purse without pain Baseline:  Goal status: INITIAL  5.  Patient to be able to sleep through the night  Baseline:  Goal status: INITIAL  6.  Patient to report 85% improvement in overall symptoms  Baseline:  Goal status: INITIAL  PLAN: PT FREQUENCY: 1-2x/week  PT DURATION: 8 weeks  PLANNED INTERVENTIONS: Therapeutic exercises, Therapeutic activity, Neuromuscular re-education, Patient/Family education, Self Care, Joint mobilization, Aquatic Therapy, Dry Needling, Electrical stimulation, Spinal mobilization, Cryotherapy, Moist heat, scar mobilization, Splintting, Taping, Vasopneumatic device, Traction, Ultrasound, Ionotophoresis 4mg /ml Dexamethasone, Manual therapy, and Re-evaluation  PLAN FOR NEXT SESSION: Assess response to  today's treatment and ionto patch #3.  Continue to progress HEP, UBE, shoulder stabilization, postural exercises, eccentric biceps, symptoms persist.   Ionto #4 if this continues to be effective.     Victorino Dike B. Angalena Cousineau, PT 04/28/23 3:47 PM [facility] [address], [address] [place], [place] [address] Phone # [phone] Fax [phone]

## 2023-05-05 ENCOUNTER — Ambulatory Visit: Payer: BC Managed Care – PPO

## 2023-05-05 ENCOUNTER — Other Ambulatory Visit: Payer: Self-pay | Admitting: *Deleted

## 2023-05-05 DIAGNOSIS — Z170 Estrogen receptor positive status [ER+]: Secondary | ICD-10-CM

## 2023-05-05 DIAGNOSIS — R252 Cramp and spasm: Secondary | ICD-10-CM | POA: Diagnosis not present

## 2023-05-05 DIAGNOSIS — M25521 Pain in right elbow: Secondary | ICD-10-CM

## 2023-05-05 DIAGNOSIS — M6281 Muscle weakness (generalized): Secondary | ICD-10-CM

## 2023-05-05 DIAGNOSIS — M25522 Pain in left elbow: Secondary | ICD-10-CM

## 2023-05-05 NOTE — Therapy (Signed)
OUTPATIENT PHYSICAL THERAPY UPPER EXTREMITY TREATMENT   Patient Name: Wanda Castro MRN: [id number] DOB:1966/03/05, 57 y.o., female Today's Date: 05/05/2023  END OF SESSION:  PT End of Session - 05/05/23 1157     Visit Number 7    Date for PT Re-Evaluation 05/12/23    Authorization Type [employer] PPO    PT Start Time 1148    PT Stop Time 1230    PT Time Calculation (min) 42 min    Activity Tolerance Patient tolerated treatment well    Behavior During Therapy WFL for tasks assessed/performed              Past Medical History:  Diagnosis Date   Anxiety    GERD (gastroesophageal reflux disease)    Heart murmur    HTN (hypertension)    Hypertension    Migraine    Personal history of radiation therapy    Right ureteral stone    Wears contact lenses    Past Surgical History:  Procedure Laterality Date   BREAST BIOPSY Left 08/04/2021   BREAST LUMPECTOMY Left 09/05/2021   BREAST LUMPECTOMY WITH RADIOACTIVE SEED AND SENTINEL LYMPH NODE BIOPSY Left 09/05/2021   Procedure: LEFT BREAST LUMPECTOMY WITH RADIOACTIVE SEED AND SENTINEL LYMPH NODE BIOPSY;  Surgeon: Griselda Miner, MD;  Location: [facility];  Service: General;  Laterality: Left;   COLONOSCOPY  2015 approx   Patient Active Problem List   Diagnosis Date Noted   Heart murmur 05/05/2022   Monoallelic mutation of CDKN2A gene 09/08/2021   Genetic testing 08/25/2021   Family history of colon cancer 08/13/2021   Family history of ovarian cancer 08/13/2021   Malignant neoplasm of upper-outer quadrant of left breast in female, estrogen receptor positive (HCC) 08/11/2021   Hypertensive disorder 06/21/2019   Migraine 06/21/2019    PCP: [name], MD   REFERRING PROVIDER: Lenda Kelp, MD  REFERRING DIAG:  Diagnosis  M25.521,M25.522 (ICD-10-CM) - Pain of both elbows    THERAPY DIAG:  Cramp and spasm  Muscle weakness (generalized)  Pain in left elbow  Pain in right  elbow  Rationale for Evaluation and Treatment: Rehabilitation  ONSET DATE: 03/04/2023  SUBJECTIVE:                                                                                                                                                                                      SUBJECTIVE STATEMENT: Patient reports that her symptoms are not nearly as intense or as frequent.  She feels that the symptoms seem to slightly increase when she applies the nitroglycerin patches and she wonders if this aching may be associated with  the increased blood flow.   Hand dominance: Right  PERTINENT HISTORY: Left breast lumpectomy with radiation  PAIN:  04/28/23 Are you having pain? Yes: NPRS scale: 2/10 Pain location: bilateral distal biceps Pain description: only with use, aching, sometimes sharp Aggravating factors: picking up something heavy Relieving factors: medication, nitro  patches  PRECAUTIONS: None  RED FLAGS: None   WEIGHT BEARING RESTRICTIONS: No  FALLS:  Has patient fallen in last 6 months? No  LIVING ENVIRONMENT: Lives with: lives with their family Lives in: House/apartment   OCCUPATION: Office support  PLOF: Independent, Independent with basic ADLs, Independent with household mobility without device, Independent with community mobility without device, Independent with homemaking with ambulation, Independent with gait, and Independent with transfers  PATIENT GOALS: She hopes to gain control over the pain and be able to do her usual level of activity.     NEXT MD VISIT: prn  OBJECTIVE:   DIAGNOSTIC FINDINGS: pt44 na  PATIENT SURVEYS :  FOTO 74, predicted 71 goal met  COGNITION: Overall cognitive status: Within functional limits for tasks assessed     SENSATION: WFL  POSTURE: Slightly rounded shoulders but overall good posture for the type work she does  UPPER EXTREMITY ROM:   WFL  UPPER EXTREMITY MMT:  All 5/5 with exception of bilateral shoulder ER  4/5.   SHOULDER SPECIAL TESTS: Impingement tests: Hawkins/Kennedy impingement test: negative  Rotator cuff assessment: Drop arm test: negative and Empty can test: negative Biceps assessment: Speed's test: positive   JOINT MOBILITY TESTING:  na  PALPATION:  Tender at distal biceps only   TODAY'S TREATMENT:                                                                                                                                         DATE: 05/05/23 UBE x 3 min fwd only Standing wrist flexion stretch on wall x 5 hold 10 sec Standing wrist extension stretch on wall x 5 hold 10 sec Eccentric biceps loading 2 x 10 traditional with 3 lbs Forearm supination/pronation x 20 with 3 lbs Wrist flexion and extension with 3 lbs 2 x 10 each UE Ionto patch #2 to each distal biceps tendon with 4 hour wear time/dexamethasone.  (Patient had nitroglycerin patches in place.  Had her remove these and cleaned area with alcohol swab) Reviewed HEP verbally   DATE:04/14/23 UBE x 6 min fwd and reverse (3/3) only level 1 Ball squeezes (orange grip ball) x 20 each hand Thumb extension x 20 with orange gripper  Wrist extension x 5 each UE holding 10 sec each Supination stretch x 5 each UE holding 10 sec each Pronation stretch x 5 each UE holding 10 sec each Eccentric biceps loading 2 x 10 traditional with 3 lbs Forearm supination/pronation x 20 with 3 lbs Wrist flexion and extension with 3 lbs 2 x 10 each UE Ionto patch #2 to each  distal biceps tendon with 4 hour wear time/dexamethasone.  (Patient had nitroglycerin patches in place.  Had her remove these and cleaned area with alcohol swab)  DATE: 04/06/23 UBE x 5 min fwd and reverse (3/3) only level 1 Eccentric biceps loading 2 x 10 traditional and 2 x 10 hammer curl with 5 lbs Standing shoulder flexion with 3 lbs x 20 Standing shoulder scaption with 3 lbs x 20 Seated with arms over bolster, IASTM to bilateral distal biceps x 5 min each along with  manual STM Ionto patch to each distal biceps tendon with 4 hour wear time/dexamethasone.  (Patient had nitroglycerin patches in place.  Had her remove these and cleaned area with alcohol swab)  DATE: 03/17/23 Initial eval completed and initiated HEP  PATIENT EDUCATION: Education details: Educated on relation of shoulder weakness on elbow pain and initiated HEP Person educated: Patient Education method: Programmer, multimedia, Facilities manager, Verbal cues, and Handouts Education comprehension: verbalized understanding, returned demonstration, and verbal cues required  HOME EXERCISE PROGRAM: Access Code: [email or web address] URL: [email or web address] Date: 03/17/2023 Prepared by: Mikey Kirschner  Exercises - Prone Shoulder Extension - Single Arm  - 2 x daily - 7 x weekly - 2 sets - 10 reps - Prone Shoulder Row  - 2 x daily - 7 x weekly - 2 sets - 10 reps - Prone Single Arm Shoulder Horizontal Abduction with Scapular Retraction and Palm Down  - 2 x daily - 7 x weekly - 2 sets - 10 reps - Sidelying Shoulder External Rotation  - 2 x daily - 7 x weekly - 2 sets - 10 reps - Single Arm Serratus Punches in Supine with Dumbbell  - 2 x daily - 7 x weekly - 2 sets - 10 reps - Seated Alternating Bicep Curls with Rotation and Dumbbells  - 1 x daily - 7 x weekly - 3 sets - 10 reps  ASSESSMENT:  CLINICAL IMPRESSION: Wanda Castro has made good progress but has occasional symptoms.  She is responding well to current treatment plan.  She should continue to do well.  She is compliant and well motivated.  She should continue to do well    OBJECTIVE IMPAIRMENTS: decreased ROM, decreased strength, increased fascial restrictions, increased muscle spasms, impaired flexibility, and pain.   ACTIVITY LIMITATIONS: carrying, lifting, sleeping, transfers, bed mobility, bathing, dressing, reach over head, and hygiene/grooming  PARTICIPATION LIMITATIONS: meal prep, cleaning, laundry, driving, shopping, occupation, and yard  work  PERSONAL FACTORS: Careers information officer are also affecting patient's functional outcome.   REHAB POTENTIAL: Good  CLINICAL DECISION MAKING: Stable/uncomplicated  EVALUATION COMPLEXITY: Low  GOALS: Goals reviewed with patient? Yes  SHORT TERM GOALS: Target date: 04/14/2023   Pain report to be no greater than 4/10  Baseline: Goal status: MET 03/24/23  2.  Patient will be independent with initial HEP  Baseline:  Goal status: MET 03/24/23  LONG TERM GOALS: Target date: 05/12/2023   Patient to be independent with advanced HEP  Baseline:  Goal status: INITIAL  2.  Patient to report pain no greater than 2/10  Baseline:  Goal status: INITIAL  3.  Patient will be able to reach overhead into cabinets and on top of shelves without pain  Baseline:  Goal status: MET  4.  Patient to be able to pick up usual daily items such as dishes, pots/pans, purse without pain Baseline:  Goal status: INITIAL  5.  Patient to be able to sleep through the night  Baseline:  Goal status: INITIAL  6.  Patient  to report 85% improvement in overall symptoms  Baseline:  Goal status: INITIAL  PLAN: PT FREQUENCY: 1-2x/week  PT DURATION: 8 weeks  PLANNED INTERVENTIONS: Therapeutic exercises, Therapeutic activity, Neuromuscular re-education, Patient/Family education, Self Care, Joint mobilization, Aquatic Therapy, Dry Needling, Electrical stimulation, Spinal mobilization, Cryotherapy, Moist heat, scar mobilization, Splintting, Taping, Vasopneumatic device, Traction, Ultrasound, Ionotophoresis 4mg /ml Dexamethasone, Manual therapy, and Re-evaluation  PLAN FOR NEXT SESSION: Assess response to ionto patch #4.  Continue to progress HEP, UBE, shoulder stabilization, postural exercises, eccentric biceps, symptoms persist.   Ionto #4 if this continues to be effective.     Victorino Dike B. Shastina Rua, PT 05/05/23 10:00 PM [facility] [address] [place], [place]  [address] Phone # [phone] Fax [phone]

## 2023-05-07 ENCOUNTER — Encounter: Payer: Self-pay | Admitting: Adult Health

## 2023-05-07 ENCOUNTER — Inpatient Hospital Stay: Payer: BC Managed Care – PPO | Admitting: Adult Health

## 2023-05-07 ENCOUNTER — Other Ambulatory Visit: Payer: Self-pay

## 2023-05-07 ENCOUNTER — Inpatient Hospital Stay: Payer: BC Managed Care – PPO | Attending: Adult Health

## 2023-05-07 VITALS — BP 140/77 | HR 89 | Temp 97.3°F | Resp 18 | Ht 63.0 in | Wt 168.7 lb

## 2023-05-07 DIAGNOSIS — Z8049 Family history of malignant neoplasm of other genital organs: Secondary | ICD-10-CM | POA: Diagnosis not present

## 2023-05-07 DIAGNOSIS — C50412 Malignant neoplasm of upper-outer quadrant of left female breast: Secondary | ICD-10-CM | POA: Insufficient documentation

## 2023-05-07 DIAGNOSIS — Z1501 Genetic susceptibility to malignant neoplasm of breast: Secondary | ICD-10-CM | POA: Diagnosis not present

## 2023-05-07 DIAGNOSIS — M25522 Pain in left elbow: Secondary | ICD-10-CM | POA: Diagnosis not present

## 2023-05-07 DIAGNOSIS — Z1509 Genetic susceptibility to other malignant neoplasm: Secondary | ICD-10-CM

## 2023-05-07 DIAGNOSIS — Z800 Family history of malignant neoplasm of digestive organs: Secondary | ICD-10-CM | POA: Diagnosis not present

## 2023-05-07 DIAGNOSIS — Z801 Family history of malignant neoplasm of trachea, bronchus and lung: Secondary | ICD-10-CM | POA: Diagnosis not present

## 2023-05-07 DIAGNOSIS — Z170 Estrogen receptor positive status [ER+]: Secondary | ICD-10-CM | POA: Diagnosis not present

## 2023-05-07 DIAGNOSIS — Z79811 Long term (current) use of aromatase inhibitors: Secondary | ICD-10-CM | POA: Insufficient documentation

## 2023-05-07 LAB — CMP (CANCER CENTER ONLY)
ALT: 14 U/L (ref 0–44)
AST: 18 U/L (ref 15–41)
Albumin: 4.2 g/dL (ref 3.5–5.0)
Alkaline Phosphatase: 75 U/L (ref 38–126)
Anion gap: 5 (ref 5–15)
BUN: 14 mg/dL (ref 6–20)
CO2: 31 mmol/L (ref 22–32)
Calcium: 9.7 mg/dL (ref 8.9–10.3)
Chloride: 104 mmol/L (ref 98–111)
Creatinine: 0.97 mg/dL (ref 0.44–1.00)
GFR, Estimated: >60 mL/min (ref >60)
Glucose, Bld: 100 mg/dL — ABNORMAL HIGH (ref 70–99)
Potassium: 3.6 mmol/L (ref 3.5–5.1)
Sodium: 140 mmol/L (ref 135–145)
Total Bilirubin: 0.4 mg/dL (ref 0.3–1.2)
Total Protein: 6.9 g/dL (ref 6.5–8.1)

## 2023-05-07 LAB — CBC WITH DIFFERENTIAL (CANCER CENTER ONLY)
Abs Immature Granulocytes: 0 10*3/uL (ref 0.00–0.07)
Basophils Absolute: 0 10*3/uL (ref 0.0–0.1)
Basophils Relative: 1 %
Eosinophils Absolute: 0.3 10*3/uL (ref 0.0–0.5)
Eosinophils Relative: 6 %
HCT: 37.6 % (ref 36.0–46.0)
Hemoglobin: 13 g/dL (ref 12.0–15.0)
Immature Granulocytes: 0 %
Lymphocytes Relative: 38 %
Lymphs Abs: 2 10*3/uL (ref 0.7–4.0)
MCH: 31.4 pg (ref 26.0–34.0)
MCHC: 34.6 g/dL (ref 30.0–36.0)
MCV: 90.8 fL (ref 80.0–100.0)
Monocytes Absolute: 0.4 10*3/uL (ref 0.1–1.0)
Monocytes Relative: 7 %
Neutro Abs: 2.5 10*3/uL (ref 1.7–7.7)
Neutrophils Relative %: 48 %
Platelet Count: 219 10*3/uL (ref 150–400)
RBC: 4.14 MIL/uL (ref 3.87–5.11)
RDW: 11.8 % (ref 11.5–15.5)
WBC Count: 5.2 10*3/uL (ref 4.0–10.5)
nRBC: 0 % (ref 0.0–0.2)

## 2023-05-07 NOTE — Assessment & Plan Note (Signed)
Wanda Castro is a 57 year old woman with history of stage Ia ER/PR positive left-sided breast cancer diagnosed in January 2023 status post lumpectomy followed by adjuvant radiation and antiestrogen therapy with anastrozole.  Stage IA left sided ER/PR positive breast cancer: She has no clinical or radiographic signs of breast cancer recurrence.  She will continue on anastrozole daily.  Her next mammogram is due in December 2024.  Since her breast cancer was mammographically occult she will undergo a breast MRI in June 2025.  Bone health: Bone density completed with Dr. Arelia Sneddon She should undergo bone density testing every 2 years while taking anastrozole daily.  Bone health reviewed today. CDK2NA mutation: This increases her risk for melanoma and pancreatic cancer.  She is seeing dermatology every 6 months as recommended and I placed a referral to Dr. Barron Alvine and GI for evaluation. Health maintenance: I recommended that she continue to follow-up with her primary care provider and gynecologist.  Healthy diet and exercise was recommended. Left elbow pain: Continue follow-up with sports medicine.  Wanda Castro will RTC in 6 months for labs and f/u.

## 2023-05-07 NOTE — Progress Notes (Signed)
[facility] Cancer Follow up:    [name], MD [address] [place] [address]   DIAGNOSIS:  Cancer Staging  Malignant neoplasm of upper-outer quadrant of left breast in female, estrogen receptor positive (HCC) Staging form: Breast, AJCC 8th Edition - Clinical: Stage IA (cT1a, cN0, cM0, G1, ER+, PR+, HER2: Equivocal) - Unsigned Stage prefix: Initial diagnosis Method of lymph node assessment: Clinical Histologic grading system: 3 grade system - Pathologic stage from 09/05/2021: Stage IA (pT1a, pN0, cM0, G1, ER+, PR+, HER2-) - Signed by Malachy Mood, MD on 11/07/2021 Stage prefix: Initial diagnosis Histologic grading system: 3 grade system Residual tumor (R): R0 - None   SUMMARY OF ONCOLOGIC HISTORY: Oncology History Overview Note   Cancer Staging  Malignant neoplasm of upper-outer quadrant of left breast in female, estrogen receptor positive (HCC) Staging form: Breast, AJCC 8th Edition - Clinical: Stage IA (cT1a, cN0, cM0, G1, ER+, PR+, HER2: Equivocal) - Unsigned - Pathologic stage from 09/05/2021: Stage IA (pT1a, pN0, cM0, G1, ER+, PR+, HER2-) - Signed by Malachy Mood, MD on 11/07/2021    Malignant neoplasm of upper-outer quadrant of left breast in female, estrogen receptor positive (HCC)  07/31/2021 Imaging   CLINICAL DATA:  Screening recall for a left breast asymmetry.   EXAM: DIGITAL DIAGNOSTIC UNILATERAL LEFT MAMMOGRAM WITH TOMOSYNTHESIS AND CAD; ULTRASOUND LEFT BREAST LIMITED  IMPRESSION: 1. There is an indeterminate 3 mm mass in the left breast at 2 o'clock.   2.  No evidence of left axillary lymphadenopathy.   08/04/2021 Initial Biopsy   Diagnosis Breast, left, needle core biopsy, UOQ, 2 o'clock, 3cmfn, ribbon clip - INVASIVE DUCTAL CARCINOMA - DUCTAL CARCINOMA IN SITU - SEE COMMENT  Microscopic Comment Based on the biopsy, the carcinoma appears Nottingham grade 1 of 3 and measures 0.5 cm in greatest linear extent.  PROGNOSTIC  INDICATORS Results: The tumor cells are EQUIVOCAL for Her2 (2+). Her2 by FISH will be performed and results reported separately. 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 Of note, a variant of uncertain significance was detected in the RAD51C gene ( p.I52L). Report date is 08/27/2021.  [employer] CustomNext-Cancer+RNAinsight panel offered by [employer] includes sequencing and rearrangement analysis for the following 52 genes:  APC, ATM, AXIN2, BAP1, BARD1, BMPR1A, BRCA1, BRCA2, BRIP1, CDH1, CDK4, CDKN2A, CHEK2, CTNNA1, DICER1, EPCAM, FH, FLCN, GREM1, HOXB13, KIT, MEN1, MET, MITF, MLH1, MSH2, MSH3, MSH6, MUTYH, NBN, NF1, NTHL1, PALB2, PDGFRA, PMS2, POLD1, POLE, PTEN, RAD50, RAD51C, RAD51D, SDHA, SDHB, SDHC, SDHD, SMAD4, SMARCA4, STK11, TP53, TSC1, TSC2, and VHL.  RNA data is routinely analyzed for use in variant interpretation for all genes.    09/05/2021 Cancer Staging   Staging form: Breast, AJCC 8th Edition - Pathologic stage from 09/05/2021: Stage IA (pT1a, pN0, cM0, G1, ER+, PR+, HER2-) - Signed by Malachy Mood, MD on 11/07/2021 Stage prefix: Initial diagnosis Histologic grading system: 3 grade system Residual tumor (R): R0 - None   09/05/2021 Definitive Surgery   FINAL MICROSCOPIC DIAGNOSIS:   A. LYMPH NODE, LEFT AXILLARY #1, SENTINEL, EXCISION:  -  No carcinoma identified in one lymph node (0/1)   B. LYMPH NODE, LEFT AXILLARY, SENTINEL, EXCISION:  -  No carcinoma identified in  one lymph node (0/1)   C. LYMPH NODE, LEFT AXILLARY, SENTINEL, EXCISION:  -  No carcinoma identified in one lymph node (0/1)   D. LYMPH NODE, LEFT AXILLARY, SENTINEL,  EXCISION:  -  No carcinoma identified in one lymph node (0/1)   E. BREAST, LEFT, LUMPECTOMY:  -  Invasive ductal carcinoma, Nottingham grade 1 of 3, 0.3 cm  -  Ductal carcinoma in-situ, low-grade  -  Calcifications associated with carcinoma  -  Margins uninvolved by carcinoma (0.25 cm; superior margin; see part F for final margin status)  -  Previous biopsy site changes present  -  See oncology table and comment below   F. BREAST, LEFT SUPERIOR AND DEEP MARGIN, EXCISION:  -  No residual carcinoma identified   G. BREAST, LEFT LATERAL MARGIN, EXCISION:  -  No residual carcinoma identified    10/13/2021 - 11/07/2021 Radiation Therapy   Site Technique Total Dose (Gy) Dose per Fx (Gy) Completed Fx Beam Energies  Breast, Left: Breast_L 3D 42.56/42.56 2.66 16/16 6XFFF  Breast, Left: Breast_L_Bst 3D 8/8 2 4/4 6X     11/2021 -  Anti-estrogen oral therapy   Anastrozole     CURRENT THERAPY: Anastrozole  INTERVAL HISTORY: Danetta A Sanchez 57 y.o. female returns for f/u of her breast cancer on treatment with anastrozole.  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  This recent breast MRI occurred in June 2024 demonstrating no abnormal enhancement and breast density category B.  Repeat MRI 1 year was recommended.  Of note mag trace was obscuring some of the left breast.  Prior to this she underwent bilateral breast diagnostic mammogram June 22, 2022 demonstrating no mammographic evidence of malignancy and breast density category B.  She has been experiencing elbow pain and sees Dr. Pearletha Forge in sports medicine for this.She reports that the pain is not necessarily better, but she is managing it. The patient has had multiple physical therapies, with varying degrees of success. She notes that some days she doesn't notice the pain at all, while on other days it is more noticeable.  The patient has been trying to maintain an active lifestyle, but has been feeling  discouraged due to the elbow pain. She has considered hiring a Systems analyst to help with her fitness goals.  Due to her increased risk for melanoma with her CDK2NA genetic mutation and is regularly seeing a dermatologist. She has been prescribed Tretinoin for her skin, which she is using without any issues.  Additionally, the patient has been dealing with a persistent fungal infection under her arm. Despite using two different creams, the infection has not cleared up. She has been prescribed an oral medication to help treat the infection.  The CDK2NA mutation also puts her at risk for pancreatic cancer. She has been undergoing regular MRIs as a preventative measure. The patient also has a family history of esophageal cancer and would love to be able to undergo EUS with GI if they would agree since she has now 2 first degree cousins with esophageal cancer.  Lastly, the patient has been experiencing back pain, which she attributes to long-standing back issues rather than a new symptom. She is aware of the potential link between back pain and pancreatic cancer, but is reassured by the fact that her most recent MRI showed no abnormalities in the pancreas.  Patient Active Problem List   Diagnosis Date Noted   Heart murmur 05/05/2022   Monoallelic mutation of CDKN2A gene 09/08/2021  Genetic testing 08/25/2021   Family history of colon cancer 08/13/2021   Family history of ovarian cancer 08/13/2021   Malignant neoplasm of upper-outer quadrant of left breast in female, estrogen receptor positive (HCC) 08/11/2021   Hypertensive disorder 06/21/2019   Migraine 06/21/2019    is allergic to adhesive [tape].  MEDICAL HISTORY: Past Medical History:  Diagnosis Date   Anxiety    GERD (gastroesophageal reflux disease)    Heart murmur    HTN (hypertension)    Hypertension    Migraine    Personal history of radiation therapy    Right ureteral stone    Wears contact lenses     SURGICAL  HISTORY: Past Surgical History:  Procedure Laterality Date   BREAST BIOPSY Left 08/04/2021   BREAST LUMPECTOMY Left 09/05/2021   BREAST LUMPECTOMY WITH RADIOACTIVE SEED AND SENTINEL LYMPH NODE BIOPSY Left 09/05/2021   Procedure: LEFT BREAST LUMPECTOMY WITH RADIOACTIVE SEED AND SENTINEL LYMPH NODE BIOPSY;  Surgeon: Griselda Miner, MD;  Location: [facility];  Service: General;  Laterality: Left;   COLONOSCOPY  2015 approx    SOCIAL HISTORY: Social History   Socioeconomic History   Marital status: Single    Spouse name: Not on file   Number of children: 0   Years of education: Not on file   Highest education level: Not on file  Occupational History   Occupation: She works in administration in [employer]  Tobacco Use   Smoking status: Never   Smokeless tobacco: Never  Substance and Sexual Activity   Alcohol use: No   Drug use: No   Sexual activity: Not on file  Other Topics Concern   Not on file  Social History Narrative   Not on file   Social Determinants of Health   Financial Resource Strain: Not on file  Food Insecurity: Not on file  Transportation Needs: Not on file  Physical Activity: Not on file  Stress: Not on file  Social Connections: Not on file  Intimate Partner Violence: Not on file    FAMILY HISTORY: Family History  Problem Relation Age of Onset   Atrial fibrillation Mother    COPD Mother    Hypertension Father    Renal cancer Father 45   Cancer Maternal Uncle 55       colon cancer   Cancer Paternal Aunt        small cell lung cancer   Prostate cancer Paternal Uncle    Prostate cancer Paternal Uncle    Ovarian cancer Cousin 64       maternal first cousin   Lung cancer Cousin        paternal first cousin   Lung cancer Cousin        paternal first cousin   Uterine cancer Cousin        paternal first cousin, dx. 30s   Ovarian cancer Cousin        dx. 30s   Colon cancer Cousin        paternal first cousin, dx. 57s    Esophageal cancer Cousin     Review of Systems  Constitutional:  Negative for appetite change, chills, fatigue, fever and unexpected weight change.  HENT:   Negative for hearing loss, lump/mass and trouble swallowing.   Eyes:  Negative for eye problems and icterus.  Respiratory:  Negative for chest tightness, cough and shortness of breath.   Cardiovascular:  Negative for chest pain, leg swelling and palpitations.  Gastrointestinal:  Negative  for abdominal distention, abdominal pain, constipation, diarrhea, nausea and vomiting.  Endocrine: Negative for hot flashes.  Genitourinary:  Negative for difficulty urinating.   Musculoskeletal:  Negative for arthralgias.  Skin:  Negative for itching and rash.  Neurological:  Negative for dizziness, extremity weakness, headaches and numbness.  Hematological:  Negative for adenopathy. Does not bruise/bleed easily.  Psychiatric/Behavioral:  Negative for depression. The patient is not nervous/anxious.       PHYSICAL EXAMINATION    Vitals:   05/07/23 1500  BP: (!) 140/77  Pulse: 89  Resp: 18  Temp: (!) 97.3 F (36.3 C)  SpO2: 99%    Physical Exam Constitutional:      General: She is not in acute distress.    Appearance: Normal appearance. She is not toxic-appearing.  HENT:     Head: Normocephalic and atraumatic.     Mouth/Throat:     Mouth: Mucous membranes are moist.     Pharynx: Oropharynx is clear. No oropharyngeal exudate or posterior oropharyngeal erythema.  Eyes:     General: No scleral icterus. Cardiovascular:     Rate and Rhythm: Normal rate and regular rhythm.     Pulses: Normal pulses.     Heart sounds: Normal heart sounds.  Pulmonary:     Effort: Pulmonary effort is normal.     Breath sounds: Normal breath sounds.  Chest:     Comments: Left breast status postlumpectomy and radiation no sign of local recurrence right breast is benign. Abdominal:     General: Abdomen is flat. Bowel sounds are normal. There is no  distension.     Palpations: Abdomen is soft.     Tenderness: There is no abdominal tenderness.  Musculoskeletal:        General: No swelling.     Cervical back: Neck supple.  Lymphadenopathy:     Cervical: No cervical adenopathy.     Upper Body:     Right upper body: No axillary adenopathy.     Left upper body: No axillary adenopathy.  Skin:    General: Skin is warm and dry.     Findings: No rash.  Neurological:     General: No focal deficit present.     Mental Status: She is alert.  Psychiatric:        Mood and Affect: Mood normal.        Behavior: Behavior normal.     LABORATORY DATA:  CBC    Component Value Date/Time   WBC 5.2 05/07/2023 1444   WBC 11.2 (H) 11/28/2015 2326   RBC 4.14 05/07/2023 1444   HGB 13.0 05/07/2023 1444   HCT 37.6 05/07/2023 1444   PLT 219 05/07/2023 1444   MCV 90.8 05/07/2023 1444   MCH 31.4 05/07/2023 1444   MCHC 34.6 05/07/2023 1444   RDW 11.8 05/07/2023 1444   LYMPHSABS 2.0 05/07/2023 1444   MONOABS 0.4 05/07/2023 1444   EOSABS 0.3 05/07/2023 1444   BASOSABS 0.0 05/07/2023 1444    CMP     Component Value Date/Time   NA 140 05/07/2023 1444   K 3.6 05/07/2023 1444   CL 104 05/07/2023 1444   CO2 31 05/07/2023 1444   GLUCOSE 100 (H) 05/07/2023 1444   BUN 14 05/07/2023 1444   CREATININE 0.97 05/07/2023 1444   CALCIUM 9.7 05/07/2023 1444   PROT 6.9 05/07/2023 1444   ALBUMIN 4.2 05/07/2023 1444   AST 18 05/07/2023 1444   ALT 14 05/07/2023 1444   ALKPHOS 75 05/07/2023 1444   BILITOT  0.4 05/07/2023 1444   GFRNONAA >60 05/07/2023 1444   GFRAA >60 11/28/2015 2326       ASSESSMENT and THERAPY PLAN:   Malignant neoplasm of upper-outer quadrant of left breast in female, estrogen receptor positive (HCC) Lowanna is a 57 year old woman with history of stage Ia ER/PR positive left-sided breast cancer diagnosed in January 2023 status post lumpectomy followed by adjuvant radiation and antiestrogen therapy with anastrozole.  Stage IA  left sided ER/PR positive breast cancer: She has no clinical or radiographic signs of breast cancer recurrence.  She will continue on anastrozole daily.  Her next mammogram is due in December 2024.  Since her breast cancer was mammographically occult she will undergo a breast MRI in June 2025.  Bone health: Bone density completed with Dr. Arelia Sneddon She should undergo bone density testing every 2 years while taking anastrozole daily.  Bone health reviewed today. CDK2NA mutation: This increases her risk for melanoma and pancreatic cancer.  She is seeing dermatology every 6 months as recommended and I placed a referral to Dr. Barron Alvine and GI for evaluation. Health maintenance: I recommended that she continue to follow-up with her primary care provider and gynecologist.  Healthy diet and exercise was recommended. Left elbow pain: Continue follow-up with sports medicine.  Aniesa will RTC in 6 months for labs and f/u.     All questions were answered. The patient knows to call the clinic with any problems, questions or concerns. We can certainly see the patient much sooner if necessary.  Total encounter time:45 minutes*in face-to-face visit time, chart review, lab review, care coordination, order entry, and documentation of the encounter time.    Lillard Anes, NP 05/07/23 4:38 PM Medical Oncology and Hematology [facility] [address] [place], [place] [address] Tel. [phone]    Fax. [phone]  *Total Encounter Time as defined by the Centers for Medicare and Medicaid Services includes, in addition to the face-to-face time of a patient visit (documented in the note above) non-face-to-face time: obtaining and reviewing outside history, ordering and reviewing medications, tests or procedures, care coordination (communications with other health care professionals or caregivers) and documentation in the medical record.

## 2023-05-10 ENCOUNTER — Telehealth: Payer: Self-pay | Admitting: Adult Health

## 2023-05-10 NOTE — Telephone Encounter (Signed)
Spoke with patient confirming upcoming appointment  

## 2023-05-12 ENCOUNTER — Ambulatory Visit: Payer: BC Managed Care – PPO

## 2023-05-12 DIAGNOSIS — R252 Cramp and spasm: Secondary | ICD-10-CM | POA: Diagnosis not present

## 2023-05-12 DIAGNOSIS — M25521 Pain in right elbow: Secondary | ICD-10-CM

## 2023-05-12 DIAGNOSIS — M6281 Muscle weakness (generalized): Secondary | ICD-10-CM

## 2023-05-12 DIAGNOSIS — M25522 Pain in left elbow: Secondary | ICD-10-CM

## 2023-05-12 NOTE — Therapy (Signed)
OUTPATIENT PHYSICAL THERAPY UPPER EXTREMITY TREATMENT PHYSICAL THERAPY DISCHARGE SUMMARY  Visits from Start of Care: 8  Current functional level related to goals / functional outcomes: See below   Remaining deficits: See below   Education / Equipment: See below   Patient agrees to discharge. Patient goals were met. Patient is being discharged due to meeting the stated rehab goals.    Patient Name: Wanda Castro MRN: [id number] DOB:[birth year], 57 y.o., female Today's Date: 05/12/2023  END OF SESSION:  PT End of Session - 05/12/23 1156     Visit Number 8    Date for PT Re-Evaluation 05/12/23    Authorization Type [employer] PPO    PT Start Time 1145    PT Stop Time 1215    PT Time Calculation (min) 30 min    Activity Tolerance Patient tolerated treatment well    Behavior During Therapy WFL for tasks assessed/performed              Past Medical History:  Diagnosis Date   Anxiety    GERD (gastroesophageal reflux disease)    Heart murmur    HTN (hypertension)    Hypertension    Migraine    Personal history of radiation therapy    Right ureteral stone    Wears contact lenses    Past Surgical History:  Procedure Laterality Date   BREAST BIOPSY Left 08/04/2021   BREAST LUMPECTOMY Left 09/05/2021   BREAST LUMPECTOMY WITH RADIOACTIVE SEED AND SENTINEL LYMPH NODE BIOPSY Left 09/05/2021   Procedure: LEFT BREAST LUMPECTOMY WITH RADIOACTIVE SEED AND SENTINEL LYMPH NODE BIOPSY;  Surgeon: Griselda Miner, MD;  Location: [facility];  Service: General;  Laterality: Left;   COLONOSCOPY  2015 approx   Patient Active Problem List   Diagnosis Date Noted   Heart murmur 05/05/2022   Monoallelic mutation of CDKN2A gene 09/08/2021   Genetic testing 08/25/2021   Family history of colon cancer 08/13/2021   Family history of ovarian cancer 08/13/2021   Malignant neoplasm of upper-outer quadrant of left breast in female, estrogen receptor positive (HCC)  08/11/2021   Hypertensive disorder 06/21/2019   Migraine 06/21/2019    PCP: [name], MD   REFERRING PROVIDER: Lenda Kelp, MD  REFERRING DIAG:  Diagnosis  M25.521,M25.522 (ICD-10-CM) - Pain of both elbows    THERAPY DIAG:  Cramp and spasm  Muscle weakness (generalized)  Pain in left elbow  Pain in right elbow  Rationale for Evaluation and Treatment: Rehabilitation  ONSET DATE: 03/04/2023  SUBJECTIVE:  SUBJECTIVE STATEMENT: Patient reports that she started the Meloxicam and she has had very little to no pain.  She feels she will be able to continue with her HEP and the meloxicam on her own.  She will see provider for f/u in early November.   Hand dominance: Right  PERTINENT HISTORY: Left breast lumpectomy with radiation  PAIN:  04/28/23 Are you having pain? Yes: NPRS scale: 2/10 Pain location: bilateral distal biceps Pain description: only with use, aching, sometimes sharp Aggravating factors: picking up something heavy Relieving factors: medication, nitro  patches  PRECAUTIONS: None  RED FLAGS: None   WEIGHT BEARING RESTRICTIONS: No  FALLS:  Has patient fallen in last 6 months? No  LIVING ENVIRONMENT: Lives with: lives with their family Lives in: House/apartment   OCCUPATION: Office support  PLOF: Independent, Independent with basic ADLs, Independent with household mobility without device, Independent with community mobility without device, Independent with homemaking with ambulation, Independent with gait, and Independent with transfers  PATIENT GOALS: She hopes to gain control over the pain and be able to do her usual level of activity.     NEXT MD VISIT: prn  OBJECTIVE:   DIAGNOSTIC FINDINGS: pt44 na  PATIENT SURVEYS :  FOTO 74, predicted 71 goal  met  COGNITION: Overall cognitive status: Within functional limits for tasks assessed     SENSATION: WFL  POSTURE: Slightly rounded shoulders but overall good posture for the type work she does  UPPER EXTREMITY ROM:   WFL  UPPER EXTREMITY MMT:  All 5/5 with exception of bilateral shoulder ER 4/5.   SHOULDER SPECIAL TESTS: Impingement tests: Hawkins/Kennedy impingement test: negative  Rotator cuff assessment: Drop arm test: negative and Empty can test: negative Biceps assessment: Speed's test: positive   (05/12/23 still positive but at distal biceps)  JOINT MOBILITY TESTING:  na  PALPATION:  Tender at distal biceps only   TODAY'S TREATMENT:                                                                                                                                         DATE: 05/12/23 UBE x 3 min fwd only Standing wrist flexion stretch on wall x 5 hold 10 sec Standing wrist extension stretch on wall x 5 hold 10 sec Lat pull down 45 lbs 2 x 10 Seated matrix row 25 lbs 2 x 10 Eccentric biceps loading 2 x 10 traditional with 4 lbs Forearm supination/pronation x 20 with 4 lbs Wrist flexion and extension with 4 lbs 2 x 10 each UE Reviewed HEP  DC assessment completed   DATE: 05/05/23 UBE x 3 min fwd only Standing wrist flexion stretch on wall x 5 hold 10 sec Standing wrist extension stretch on wall x 5 hold 10 sec Eccentric biceps loading 2 x 10 traditional with 3 lbs Forearm supination/pronation x 20 with 3 lbs Wrist flexion and extension with 3 lbs 2  x 10 each UE Ionto patch #2 to each distal biceps tendon with 4 hour wear time/dexamethasone.  (Patient had nitroglycerin patches in place.  Had her remove these and cleaned area with alcohol swab) Reviewed HEP verbally   DATE:04/14/23 UBE x 6 min fwd and reverse (3/3) only level 1 Ball squeezes (orange grip ball) x 20 each hand Thumb extension x 20 with orange gripper  Wrist extension x 5 each UE holding 10 sec  each Supination stretch x 5 each UE holding 10 sec each Pronation stretch x 5 each UE holding 10 sec each Eccentric biceps loading 2 x 10 traditional with 3 lbs Forearm supination/pronation x 20 with 3 lbs Wrist flexion and extension with 3 lbs 2 x 10 each UE Ionto patch #2 to each distal biceps tendon with 4 hour wear time/dexamethasone.  (Patient had nitroglycerin patches in place.  Had her remove these and cleaned area with alcohol swab)   DATE: 03/17/23 Initial eval completed and initiated HEP  PATIENT EDUCATION: Education details: Educated on relation of shoulder weakness on elbow pain and initiated HEP Person educated: Patient Education method: Programmer, multimedia, Facilities manager, Verbal cues, and Handouts Education comprehension: verbalized understanding, returned demonstration, and verbal cues required  HOME EXERCISE PROGRAM: Access Code: [email or web address] URL: [email or web address] Date: 03/17/2023 Prepared by: Mikey Kirschner  Exercises - Prone Shoulder Extension - Single Arm  - 2 x daily - 7 x weekly - 2 sets - 10 reps - Prone Shoulder Row  - 2 x daily - 7 x weekly - 2 sets - 10 reps - Prone Single Arm Shoulder Horizontal Abduction with Scapular Retraction and Palm Down  - 2 x daily - 7 x weekly - 2 sets - 10 reps - Sidelying Shoulder External Rotation  - 2 x daily - 7 x weekly - 2 sets - 10 reps - Single Arm Serratus Punches in Supine with Dumbbell  - 2 x daily - 7 x weekly - 2 sets - 10 reps - Seated Alternating Bicep Curls with Rotation and Dumbbells  - 1 x daily - 7 x weekly - 3 sets - 10 reps  ASSESSMENT:  CLINICAL IMPRESSION: Wanda Castro's symptoms seem to have resolved with resuming the Meloxicam.  All objective findings are normal with exception of positive speeds test which is unusually positive at distal biceps, not proximal.  This could have been due to administering the test immediately after several exercises.  She has met all goals and is independent and compliant with  her HEP.  She should continue to do well.  We will DC at this time.    OBJECTIVE IMPAIRMENTS: decreased ROM, decreased strength, increased fascial restrictions, increased muscle spasms, impaired flexibility, and pain.   ACTIVITY LIMITATIONS: carrying, lifting, sleeping, transfers, bed mobility, bathing, dressing, reach over head, and hygiene/grooming  PARTICIPATION LIMITATIONS: meal prep, cleaning, laundry, driving, shopping, occupation, and yard work  PERSONAL FACTORS: Careers information officer are also affecting patient's functional outcome.   REHAB POTENTIAL: Good  CLINICAL DECISION MAKING: Stable/uncomplicated  EVALUATION COMPLEXITY: Low  GOALS: Goals reviewed with patient? Yes  SHORT TERM GOALS: Target date: 04/14/2023   Pain report to be no greater than 4/10  Baseline: Goal status: MET 03/24/23  2.  Patient will be independent with initial HEP  Baseline:  Goal status: MET 03/24/23  LONG TERM GOALS: Target date: 05/12/2023   Patient to be independent with advanced HEP  Baseline:  Goal status: MET  2.  Patient to report pain no greater than 2/10  Baseline:  Goal  status: MET  3.  Patient will be able to reach overhead into cabinets and on top of shelves without pain  Baseline:  Goal status: MET  4.  Patient to be able to pick up usual daily items such as dishes, pots/pans, purse without pain Baseline:  Goal status: MET  5.  Patient to be able to sleep through the night  Baseline:  Goal status: MET  6.  Patient to report 85% improvement in overall symptoms  Baseline:  Goal status: MET  PLAN: PT FREQUENCY: 1-2x/week  PT DURATION: 8 weeks  PLANNED INTERVENTIONS: Therapeutic exercises, Therapeutic activity, Neuromuscular re-education, Patient/Family education, Self Care, Joint mobilization, Aquatic Therapy, Dry Needling, Electrical stimulation, Spinal mobilization, Cryotherapy, Moist heat, scar mobilization, Splintting, Taping, Vasopneumatic device, Traction,  Ultrasound, Ionotophoresis 4mg /ml Dexamethasone, Manual therapy, and Re-evaluation  PLAN FOR NEXT SESSION: DC to HEP.     Victorino Dike B. Estephany Perot, PT 05/12/23 12:35 PM [facility] [address] [place], [place] [address] Phone # [phone] Fax [phone]

## 2023-06-01 ENCOUNTER — Telehealth: Payer: Self-pay | Admitting: Gastroenterology

## 2023-06-01 NOTE — Telephone Encounter (Signed)
Good morning Dr. Barron Alvine,    We received a referral from this patient wishing to schedule an appointment for Monoallelic mutation of CDKN2A gene. Patient recently had a colonoscopy in 01/2023 with Dr. Matthias Hughs with [facility]. Patient states she would like to get screening done with you. Records from colonoscopy were scanned into Media for you to review. Would you please advise on scheduling?  Thank you.

## 2023-06-14 ENCOUNTER — Encounter: Payer: Self-pay | Admitting: Gastroenterology

## 2023-06-16 ENCOUNTER — Encounter: Payer: Self-pay | Admitting: Family Medicine

## 2023-06-16 ENCOUNTER — Ambulatory Visit (INDEPENDENT_AMBULATORY_CARE_PROVIDER_SITE_OTHER): Payer: BC Managed Care – PPO | Admitting: Family Medicine

## 2023-06-16 VITALS — BP 136/84 | Ht 63.0 in | Wt 168.0 lb

## 2023-06-16 DIAGNOSIS — M7521 Bicipital tendinitis, right shoulder: Secondary | ICD-10-CM | POA: Diagnosis not present

## 2023-06-16 DIAGNOSIS — M7522 Bicipital tendinitis, left shoulder: Secondary | ICD-10-CM | POA: Diagnosis not present

## 2023-06-16 MED ORDER — PREDNISONE 10 MG PO TABS: ORAL_TABLET | ORAL | 0 refills | Status: DC

## 2023-06-16 NOTE — Progress Notes (Signed)
PCP: [name], MD  Chief Complaint: bilateral distal biceps pain  Subjective:   HPI: Patient is a 57 y.o. female here for Follow-up of her bilateral distal biceps tendinopathy.  Patient has been doing physical therapy as well as the meloxicam treatment.  Patient states that the physical therapist has discharged her because she states that they felt like they had reached the end of their sessions.  Patient notes that she is improved approximately 30% but the other day she was holding a 5-year-old baby for extended periods of time and did note that the pain felt like it came back/is more present now.  Patient has not been doing her physical therapy exercises at home as much.  Patient does note that she took 2 weeks of meloxicam and that did feel like it knocked out the pain completely.  Patient still has some pain at the distal biceps tendon near its insertion as well just to inch proximal to that. Patient notes that she has been using her nitro patches with minimal improvement. No other concerns at this time.   Past Medical History:  Diagnosis Date   Anxiety    GERD (gastroesophageal reflux disease)    Heart murmur    HTN (hypertension)    Hypertension    Migraine    Personal history of radiation therapy    Right ureteral stone    Wears contact lenses     Current Outpatient Medications on File Prior to Visit  Medication Sig Dispense Refill   ALPRAZolam (XANAX) 0.25 MG tablet Take 0.25 mg by mouth at bedtime as needed for anxiety.     anastrozole (ARIMIDEX) 1 MG tablet TAKE 1 TABLET BY MOUTH EVERY DAY 90 tablet 0   Calcium Carb-Cholecalciferol (CALCIUM 600 + D PO) Take 1 tablet by mouth daily.     losartan-hydrochlorothiazide (HYZAAR) 100-12.5 MG tablet Take by mouth daily.     mometasone (ELOCON) 0.1 % cream mometasone 0.1 % topical cream  APPLY TO EXTERNAL EAR ONCE DAILY AS NEEDED ITCHING     nitroGLYCERIN (NITRODUR - DOSED IN MG/24 HR) 0.2 mg/hr patch Apply 1/4th patch to  affected elbows, change daily 30 patch 3   No current facility-administered medications on file prior to visit.    Past Surgical History:  Procedure Laterality Date   BREAST BIOPSY Left 08/04/2021   BREAST LUMPECTOMY Left 09/05/2021   BREAST LUMPECTOMY WITH RADIOACTIVE SEED AND SENTINEL LYMPH NODE BIOPSY Left 09/05/2021   Procedure: LEFT BREAST LUMPECTOMY WITH RADIOACTIVE SEED AND SENTINEL LYMPH NODE BIOPSY;  Surgeon: Griselda Miner, MD;  Location: [facility];  Service: General;  Laterality: Left;   COLONOSCOPY  2015 approx    Allergies  Allergen Reactions   Adhesive [Tape] Other (See Comments)    Red irritated skin    BP 136/84   Ht 5\' 3"  (1.6 m)   Wt 168 lb (76.2 kg)   BMI 29.76 kg/m       No data to display              No data to display              Objective:  Physical Exam:  Gen: NAD, comfortable in exam room Inspection reveals no signs of acute abnormality, swelling, erythema, deformity.  There is tenderness to palpation over the distal biceps tendon at its insertion as well as 1 inch proximally to that.  No tenderness over the biceps itself Full range of motion with 5 out of  5 strength with some pain noted against resistance.  Mild pain noted with supination. Neurovascularly intact.    Assessment & Plan:  1. 1. Bilateral biceps tendinitis -Patient symptoms continue to be consistent with distal biceps tendinopathy.  Discussed with patient options at this time, given the patient has done meloxicam as well as physical therapy, patient would benefit from a stronger anti-inflammatory.  Will go ahead and do a Medrol Dosepak at this time. -Goal is to decrease the inflammation going on in the distal bicipital tendon and have patient continue doing her at home physical therapy for further benefit.  Discussed with patient the option of doing shockwave therapy over the left distal biceps tendon.  Patient will let us know in approximately 1 to 2 weeks  time if she would like to go ahead with this.    Brenton Grills MD, PGY-4  Sports Medicine Fellow [facility]

## 2023-06-24 ENCOUNTER — Telehealth: Payer: Self-pay | Admitting: Adult Health

## 2023-06-24 ENCOUNTER — Ambulatory Visit: Payer: BC Managed Care – PPO

## 2023-06-24 ENCOUNTER — Ambulatory Visit
Admission: RE | Admit: 2023-06-24 | Discharge: 2023-06-24 | Disposition: A | Discharge status: Home / Self Care | Payer: BC Managed Care – PPO | Source: Ambulatory Visit | Attending: Adult Health | Admitting: Adult Health

## 2023-06-24 DIAGNOSIS — C50412 Malignant neoplasm of upper-outer quadrant of left female breast: Secondary | ICD-10-CM

## 2023-06-24 DIAGNOSIS — Z1501 Genetic susceptibility to malignant neoplasm of breast: Secondary | ICD-10-CM

## 2023-07-05 ENCOUNTER — Ambulatory Visit: Payer: BC Managed Care – PPO | Attending: Adult Health

## 2023-07-05 VITALS — Wt 168.1 lb

## 2023-07-05 DIAGNOSIS — Z483 Aftercare following surgery for neoplasm: Secondary | ICD-10-CM | POA: Insufficient documentation

## 2023-07-05 NOTE — Therapy (Signed)
  OUTPATIENT PHYSICAL THERAPY SOZO SCREENING NOTE   Patient Name: Wanda Castro MRN: [id number] DOB:03-30-1966, 57 y.o., female Today's Date: 07/05/2023  PCP: [name], MD REFERRING PROVIDER: Lillard Anes Cornett*   PT End of Session - 07/05/23 1619     Visit Number 8   # unchanged due to screen only   PT Start Time 1617    PT Stop Time 1621    PT Time Calculation (min) 4 min    Activity Tolerance Patient tolerated treatment well    Behavior During Therapy [facility] for tasks assessed/performed             Past Medical History:  Diagnosis Date   Anxiety    GERD (gastroesophageal reflux disease)    Heart murmur    HTN (hypertension)    Hypertension    Migraine    Personal history of radiation therapy    Right ureteral stone    Wears contact lenses    Past Surgical History:  Procedure Laterality Date   BREAST BIOPSY Left 08/04/2021   BREAST LUMPECTOMY Left 09/05/2021   BREAST LUMPECTOMY WITH RADIOACTIVE SEED AND SENTINEL LYMPH NODE BIOPSY Left 09/05/2021   Procedure: LEFT BREAST LUMPECTOMY WITH RADIOACTIVE SEED AND SENTINEL LYMPH NODE BIOPSY;  Surgeon: Griselda Miner, MD;  Location: [facility];  Service: General;  Laterality: Left;   COLONOSCOPY  2015 approx   Patient Active Problem List   Diagnosis Date Noted   Heart murmur 05/05/2022   Monoallelic mutation of CDKN2A gene 09/08/2021   Genetic testing 08/25/2021   Family history of colon cancer 08/13/2021   Family history of ovarian cancer 08/13/2021   Malignant neoplasm of upper-outer quadrant of left breast in female, estrogen receptor positive (HCC) 08/11/2021   Hypertensive disorder 06/21/2019   Migraine 06/21/2019    REFERRING DIAG: left breast cancer at risk for lymphedema  THERAPY DIAG:  Aftercare following surgery for neoplasm  PERTINENT HISTORY: Left lumpectomy and SLNB 09/05/21 with 0/3 LN positive. GERD, HTN, migraines   PRECAUTIONS: left UE Lymphedema risk,  None  SUBJECTIVE: Pt returns for her 3 month L-Dex screen.   PAIN:  Are you having pain? No.  SOZO SCREENING: Patient was assessed today using the SOZO machine to determine the lymphedema index score. This was compared to her baseline score. It was determined that she is within the recommended range when compared to her baseline and no further action is needed at this time. She will continue SOZO screenings. These are done every 3 months for 2 years post operatively followed by every 6 months for 2 years, and then annually.   L-DEX FLOWSHEETS - 07/05/23 1600       L-DEX LYMPHEDEMA SCREENING   Measurement Type Unilateral    L-DEX MEASUREMENT EXTREMITY Upper Extremity    POSITION  Standing    DOMINANT SIDE Right    At Risk Side Left    BASELINE SCORE (UNILATERAL) -0.5    L-DEX SCORE (UNILATERAL) -2.6    VALUE CHANGE (UNILAT) -2.1               Hermenia Bers, PTA 07/05/2023, 4:20 PM

## 2023-07-13 ENCOUNTER — Other Ambulatory Visit: Payer: Self-pay | Admitting: Hematology

## 2023-07-16 ENCOUNTER — Encounter: Payer: Self-pay | Admitting: Adult Health

## 2023-08-20 ENCOUNTER — Encounter: Payer: Self-pay | Admitting: Adult Health

## 2023-08-26 ENCOUNTER — Ambulatory Visit: Payer: BC Managed Care – PPO | Admitting: Gastroenterology

## 2023-09-20 ENCOUNTER — Ambulatory Visit: Payer: BC Managed Care – PPO | Attending: Adult Health

## 2023-09-20 VITALS — Wt 170.4 lb

## 2023-09-20 DIAGNOSIS — Z483 Aftercare following surgery for neoplasm: Secondary | ICD-10-CM | POA: Insufficient documentation

## 2023-09-20 NOTE — Therapy (Signed)
  OUTPATIENT PHYSICAL THERAPY SOZO SCREENING NOTE   Patient Name: Wanda Castro MRN: [id number] DOB:06-Jun-1966, 58 y.o., female Today's Date: 09/20/2023  PCP: [name], MD REFERRING PROVIDER: Lillard Anes Cornett*   PT End of Session - 09/20/23 1627     Visit Number 8   # unchanged due to screen only   PT Start Time 1625    PT Stop Time 1629    PT Time Calculation (min) 4 min    Activity Tolerance Patient tolerated treatment well    Behavior During Therapy [facility] for tasks assessed/performed             Past Medical History:  Diagnosis Date   Anxiety    GERD (gastroesophageal reflux disease)    Heart murmur    HTN (hypertension)    Hypertension    Migraine    Personal history of radiation therapy    Right ureteral stone    Wears contact lenses    Past Surgical History:  Procedure Laterality Date   BREAST BIOPSY Left 08/04/2021   BREAST LUMPECTOMY Left 09/05/2021   BREAST LUMPECTOMY WITH RADIOACTIVE SEED AND SENTINEL LYMPH NODE BIOPSY Left 09/05/2021   Procedure: LEFT BREAST LUMPECTOMY WITH RADIOACTIVE SEED AND SENTINEL LYMPH NODE BIOPSY;  Surgeon: Griselda Miner, MD;  Location: [facility];  Service: General;  Laterality: Left;   COLONOSCOPY  2015 approx   Patient Active Problem List   Diagnosis Date Noted   Heart murmur 05/05/2022   Monoallelic mutation of CDKN2A gene 09/08/2021   Genetic testing 08/25/2021   Family history of colon cancer 08/13/2021   Family history of ovarian cancer 08/13/2021   Malignant neoplasm of upper-outer quadrant of left breast in female, estrogen receptor positive (HCC) 08/11/2021   Hypertensive disorder 06/21/2019   Migraine 06/21/2019    REFERRING DIAG: left breast cancer at risk for lymphedema  THERAPY DIAG:  Aftercare following surgery for neoplasm  PERTINENT HISTORY: Left lumpectomy and SLNB 09/05/21 with 0/3 LN positive. GERD, HTN, migraines   PRECAUTIONS: left UE Lymphedema risk,  None  SUBJECTIVE: Pt returns for her last 3 month L-Dex screen.   PAIN:  Are you having pain? No.  SOZO SCREENING: Patient was assessed today using the SOZO machine to determine the lymphedema index score. This was compared to her baseline score. It was determined that she is within the recommended range when compared to her baseline and no further action is needed at this time. She will continue SOZO screenings. These are done every 3 months for 2 years post operatively followed by every 6 months for 2 years, and then annually.   L-DEX FLOWSHEETS - 09/20/23 1600       L-DEX LYMPHEDEMA SCREENING   Measurement Type Unilateral    L-DEX MEASUREMENT EXTREMITY Upper Extremity    POSITION  Standing    DOMINANT SIDE Right    At Risk Side Left    BASELINE SCORE (UNILATERAL) -0.5    L-DEX SCORE (UNILATERAL) -3    VALUE CHANGE (UNILAT) -2.5            P:Begin 6 month L-Dex screen.    Hermenia Bers, PTA 09/20/2023, 4:28 PM

## 2023-10-01 ENCOUNTER — Telehealth (INDEPENDENT_AMBULATORY_CARE_PROVIDER_SITE_OTHER): Payer: Self-pay | Admitting: Otolaryngology

## 2023-10-01 NOTE — Telephone Encounter (Signed)
 Confirmed appt & location [id number] afm

## 2023-10-04 ENCOUNTER — Encounter (INDEPENDENT_AMBULATORY_CARE_PROVIDER_SITE_OTHER): Payer: Self-pay

## 2023-10-04 ENCOUNTER — Ambulatory Visit (INDEPENDENT_AMBULATORY_CARE_PROVIDER_SITE_OTHER): Payer: BC Managed Care – PPO | Admitting: Otolaryngology

## 2023-10-04 VITALS — BP 122/75 | HR 86 | Ht 63.0 in | Wt 170.0 lb

## 2023-10-04 DIAGNOSIS — H6123 Impacted cerumen, bilateral: Secondary | ICD-10-CM | POA: Insufficient documentation

## 2023-10-04 NOTE — Progress Notes (Signed)
 Patient ID: Wanda Castro, female   DOB: 19-Aug-1965, 58 y.o.   MRN: [id number]  Follow-up: Recurrent cerumen impaction  Procedure: Bilateral cerumen disimpaction.   Indication: Cerumen impaction, resulting in ear discomfort and conductive hearing loss.   Description: The patient is placed supine on the operating table. Under the operating microscope, the right ear canal is examined and is noted to be impacted with cerumen. The cerumen is carefully removed with a combination of suction catheters, cerumen curette, and alligator forceps. After the cerumen removal, the ear canal and tympanic membrane are noted to be normal. No middle ear effusion is noted. The same procedure is then repeated on the left side without exception. The patient tolerated the procedure well.  Follow-up care:  The patient is instructed not to use Q-tips to clean the ear canals. The patient will follow up in 9 months.

## 2023-10-10 ENCOUNTER — Other Ambulatory Visit: Payer: Self-pay | Admitting: Nurse Practitioner

## 2023-10-20 ENCOUNTER — Ambulatory Visit: Payer: BC Managed Care – PPO | Admitting: Gastroenterology

## 2023-10-20 ENCOUNTER — Other Ambulatory Visit (INDEPENDENT_AMBULATORY_CARE_PROVIDER_SITE_OTHER)

## 2023-10-20 ENCOUNTER — Encounter: Payer: Self-pay | Admitting: Gastroenterology

## 2023-10-20 VITALS — BP 128/80 | HR 88 | Ht 62.0 in | Wt 168.4 lb

## 2023-10-20 DIAGNOSIS — Z800 Family history of malignant neoplasm of digestive organs: Secondary | ICD-10-CM

## 2023-10-20 DIAGNOSIS — Z129 Encounter for screening for malignant neoplasm, site unspecified: Secondary | ICD-10-CM

## 2023-10-20 DIAGNOSIS — Z1509 Genetic susceptibility to other malignant neoplasm: Secondary | ICD-10-CM

## 2023-10-20 DIAGNOSIS — Z7183 Encounter for nonprocreative genetic counseling: Secondary | ICD-10-CM

## 2023-10-20 DIAGNOSIS — Z1501 Genetic susceptibility to malignant neoplasm of breast: Secondary | ICD-10-CM | POA: Diagnosis not present

## 2023-10-20 DIAGNOSIS — Z1211 Encounter for screening for malignant neoplasm of colon: Secondary | ICD-10-CM

## 2023-10-20 LAB — HEMOGLOBIN A1C: Hgb A1c MFr Bld: 6.1 % (ref 4.6–6.5)

## 2023-10-20 NOTE — Progress Notes (Signed)
 GASTROENTEROLOGY OUTPATIENT CLINIC VISIT   Primary Care Provider [name], MD [address] [place] [address] [phone]  Referring Provider [name], MD [address] [place],  [place] [address] [phone]  Patient Profile: Wanda Castro is a 58 y.o. female with a pmh significant for hypertension, migraines, anxiety, breast cancer, CDNK2A mutation, nephrolithiasis, GERD.  The patient presents to the [facility] for an evaluation and management of problem(s) noted below:  Problem List 1. Monoallelic mutation of CDKN2A gene   2. Special screening for cancer   3. Colon cancer screening    Discussed the use of AI scribe software for clinical note transcription with the patient, who gave verbal consent to proceed.  History of Present Illness This is the patient's first visit to the [facility] GI clinic.  She has followed with [facility] in the past for her Colon Cancer screening with full results as below.  She has a history of breast cancer and part of her genetic workup/evaluation showed that she has CDNK2A mutation, which increases her risk for pancreatic cancer.  It is for this main reason that she is being evaluated currently for consideration of high-risk screening.  She has no family history of pancreatic cancer.  She has no history of underlying diabetes.  She states that she has undergone three colonoscopies, the first before age 75 due to a family history of colon cancer in a second degree relative. She recalls having polyps in the past, but they were not precancerous. Her most recent colonoscopy was in the fall of 2024.  She experiences rare acid reflux episodes using Pepcid to help with these episodes.  She denies other gastrointestinal symptoms such as abdominal pain, nausea, vomiting, jaundice, dark urine, or generalized itching.  Her family history includes two paternal cousins with esophageal cancer, one of whom recently passed  away and the other is currently on a feeding tube and undergoing chemotherapy.  She is open to screening practices.   GI Review of Systems Positive as above Negative for dysphagia, odynophagia, change in bowel habits, melena, hematochezia  Review of Systems General: Denies fevers/chills/weight loss unintentionally HEENT: Denies oral lesions Cardiovascular: Denies chest pain Pulmonary: Denies shortness of breath Gastroenterological: See HPI Genitourinary: Denies darkened urine Hematological: Denies easy bruising/bleeding Dermatological: Denies jaundice Psychological: Mood is stable   Medications Current Outpatient Medications  Medication Sig Dispense Refill   ALPRAZolam (XANAX) 0.25 MG tablet Take 0.25 mg by mouth at bedtime as needed for anxiety.     anastrozole (ARIMIDEX) 1 MG tablet TAKE 1 TABLET BY MOUTH EVERY DAY 90 tablet 0   calcipotriene (DOVONOX) 0.005 % ointment Apply 1 Application topically daily.     Calcium Carb-Cholecalciferol (CALCIUM 600 + D PO) Take 1 tablet by mouth daily.     losartan-hydrochlorothiazide (HYZAAR) 100-12.5 MG tablet Take by mouth daily.     mometasone (ELOCON) 0.1 % cream mometasone 0.1 % topical cream  APPLY TO EXTERNAL EAR ONCE DAILY AS NEEDED ITCHING     nitroGLYCERIN (NITRODUR - DOSED IN MG/24 HR) 0.2 mg/hr patch Apply 1/4th patch to affected elbows, change daily 30 patch 3   tretinoin (RETIN-A) 0.05 % cream Apply 1 Application topically as needed.     predniSONE (DELTASONE) 10 MG tablet 6 tabs by mouth day 1, 5 tabs by mouth day 2, 4 tabs by mouth day 3, 3 tabs by mouth day 4, 2 tabs by mouth day 5, 1 tab by mouth day 6 (Patient not taking: Reported  on 10/20/2023) 21 tablet 0   No current facility-administered medications for this visit.    Allergies Allergies  Allergen Reactions   Adhesive [Tape] Other (See Comments)    Red irritated skin    Histories Past Medical History:  Diagnosis Date   Anxiety    Breast cancer (HCC)    left    GERD (gastroesophageal reflux disease)    Heart murmur    Hypertension    Migraine    Personal history of radiation therapy    Right ureteral stone    Wears contact lenses    Past Surgical History:  Procedure Laterality Date   BASAL CELL CARCINOMA EXCISION     face   BREAST BIOPSY Left 08/04/2021   BREAST LUMPECTOMY Left 09/05/2021   BREAST LUMPECTOMY WITH RADIOACTIVE SEED AND SENTINEL LYMPH NODE BIOPSY Left 09/05/2021   Procedure: LEFT BREAST LUMPECTOMY WITH RADIOACTIVE SEED AND SENTINEL LYMPH NODE BIOPSY;  Surgeon: Caralyn Chandler, MD;  Location: [facility];  Service: General;  Laterality: Left;   COLONOSCOPY  2015 approx   Social History   Socioeconomic History   Marital status: Single    Spouse name: Not on file   Number of children: 0   Years of education: Not on file   Highest education level: Not on file  Occupational History   Occupation: She works in administration in [employer]  Tobacco Use   Smoking status: Never   Smokeless tobacco: Never  Substance and Sexual Activity   Alcohol use: No   Drug use: No   Sexual activity: Not on file  Other Topics Concern   Not on file  Social History Narrative   Not on file   Social Drivers of Health   Financial Resource Strain: Not on file  Food Insecurity: Not on file  Transportation Needs: Not on file  Physical Activity: Not on file  Stress: Not on file  Social Connections: Not on file  Intimate Partner Violence: Not on file   Family History  Problem Relation Age of Onset   Atrial fibrillation Mother    COPD Mother    Hypertension Father    Renal cancer Father 17   Diabetes Sister    Diabetes Brother    Colon cancer Maternal Uncle 55   Lung cancer Paternal Aunt        small cell lung cancer   Prostate cancer Paternal Uncle    Prostate cancer Paternal Uncle    Ovarian cancer Cousin 68       maternal first cousin   Lung cancer Cousin        paternal first cousin   Lung cancer  Cousin        paternal first cousin   Uterine cancer Cousin        paternal first cousin, dx. 30s   Ovarian cancer Cousin        dx. 30s   Colon cancer Cousin        paternal first cousin, dx. 87s   Esophageal cancer Cousin    Inflammatory bowel disease Neg Hx    Liver disease Neg Hx    Pancreatic cancer Neg Hx    Stomach cancer Neg Hx    Rectal cancer Neg Hx    I have reviewed her medical, social, and family history in detail and updated the electronic medical record as necessary.    PHYSICAL EXAMINATION  BP 128/80 (BP Location: Right Arm, Patient Position: Sitting, Cuff Size: Large)  Pulse 88   Ht 5\' 2"  (1.575 m) Comment: height measured without shoes  Wt 168 lb 6 oz (76.4 kg)   BMI 30.80 kg/m  Wt Readings from Last 3 Encounters:  10/20/23 168 lb 6 oz (76.4 kg)  10/04/23 170 lb (77.1 kg)  09/20/23 170 lb 6 oz (77.3 kg)  GEN: NAD, appears stated age, doesn't appear chronically ill PSYCH: Cooperative, without pressured speech EYE: Conjunctivae pink, sclerae anicteric ENT: MMM CV: Nontachycardic RESP: No audible wheezing GI: NABS, soft, NT/ND, without rebound or guarding MSK/EXT: No significant lower extremity edema SKIN: No jaundice NEURO:  Alert & Oriented x 3, no focal deficits   REVIEW OF DATA  I reviewed the following data at the time of this encounter:  GI Procedures and Studies  2024 colonoscopy outside The entire examined colon was normal. The proximal colon was reinspected. The distal rectum and anal verge were normal. Reinspection of the rectum showed no additional findings. 10-year colonoscopy recall recommended  Laboratory Studies  Reviewed those in epic  Imaging Studies  2024 MRI/MRCP IMPRESSION: 1. 18 mm segment 2 liver lesion is a benign hepatic cyst. No worrisome hepatic lesions to suggest metastatic disease. 2. No findings suspicious for abdominal metastatic disease. 3. No bone lesions are identified.   ASSESSMENT  Ms. Atayde is a 58  y.o. female with a pmh significant for hypertension, migraines, anxiety, breast cancer, CDNK2A mutation, nephrolithiasis, GERD.  The patient is seen today for evaluation and management of:  1. Monoallelic mutation of CDKN2A gene   2. Special screening for cancer   3. Colon cancer screening    The patient is seen today for consideration of high risk pancreas cancer screening.  Based on known mutation in [place], the patient meets criteria for high risk pancreas cancer screening.  The patient was educated that while we are hopeful that screening will allow us  to catch a pancreatic cancer at an early stage, and that earlier staging would likely improve long term outcomes, data continues to evolve.  Our high risk pancreatic cancer guidelines follow typical guidelines across country.  We establish a baseline status and alternate between MRI/MRCP plus endoscopic ultrasound.  Assuming all is well on the initial baseline, the next study would be 1 year later.  The risks of an EUS including intestinal perforation, bleeding, infection, aspiration, and medication effects were discussed as was the possibility it may not give a definitive diagnosis if a biopsy is performed.  When a biopsy of the pancreas is done as part of the EUS, there is an additional risk of pancreatitis at the rate of about 1-2%.  It was explained that procedure related pancreatitis is typically mild, although it can be severe and even life threatening, which is why we do not perform random pancreatic biopsies and only biopsy a lesion/area we feel is concerning enough to warrant the risk.  With this being said, it must also be understood that patients who are in the high risk pancreas cancer screening cohort, are at increased risk of undergoing further imaging studies and procedures, where in which patients in average risk/normal population may be able to be followed with longer intervals or have less endoscopic biopsies performed, thus placing them at  slightly increased risk for potential complications.  We recommend initiating the protocol 10 years younger than the youngest individual diagnosed with pancreatic cancer or at age 41 if no family history and just based on genetics.  Is recommended that patient have hemoglobin A1c done yearly (can be  done via PCP).  If new onset diabetes is found, recommend consideration of earlier imaging or EUS within 3 months.  Tobacco use increases risk for pancreas cancer, so avoiding tobacco is critical.  High risk screening can be considered for ending at age 39 but can individualized based on patient's overall medical conditions.  The patient would like to move forward with high risk screening.  She has had an MRI/MRCP in 2024, thus we will move forward with an endoscopic ultrasound this year.  All patient questions were answered to the best of my ability, and the patient agrees to the aforementioned plan of action with follow-up as indicated.   PLAN  Patient will enter high risk pancreas cancer screening cohort Proceed with upper EUS Hemoglobin A1c to be drawn (should be drawn yearly by PCP) Colonoscopy for surveillance in 2034   Orders Placed This Encounter  Procedures   Procedural/ Surgical Case Request: ULTRASOUND, UPPER GI TRACT, ENDOSCOPIC   HgB A1c   Ambulatory referral to Gastroenterology    New Prescriptions   No medications on file   Modified Medications   No medications on file    Planned Follow Up No follow-ups on file.   Total Time in Face-to-Face and in Coordination of Care for patient including independent/personal interpretation/review of prior testing, medical history, examination, medication adjustment, communicating results with the patient directly, and documentation within the EHR is 45 minutes.   Yong Henle, MD [facility] Advanced Endoscopy Office # [phone]

## 2023-10-20 NOTE — Patient Instructions (Addendum)
 You will be due for a recall colonoscopy in 2034. We will send you a reminder in the mail when it gets closer to that time.  Your provider has requested that you go to the basement level for lab work before leaving today. Press "B" on the elevator. The lab is located at the first door on the left as you exit the elevator.  You have been scheduled for an endoscopy. Please follow written instructions given to you at your visit today.  If you use inhalers (even only as needed), please bring them with you on the day of your procedure.  If you take any of the following medications, they will need to be adjusted prior to your procedure:   DO NOT TAKE 7 DAYS PRIOR TO TEST- Trulicity (dulaglutide) Ozempic, Wegovy (semaglutide) Mounjaro (tirzepatide) Bydureon Bcise (exanatide extended release)  DO NOT TAKE 1 DAY PRIOR TO YOUR TEST Rybelsus (semaglutide) Adlyxin (lixisenatide) Victoza (liraglutide) Byetta (exanatide) ________________________________________________________   Thank you for choosing me and [facility].  Dr. Meridee Score

## 2023-10-23 ENCOUNTER — Encounter: Payer: Self-pay | Admitting: Gastroenterology

## 2023-10-23 DIAGNOSIS — Z129 Encounter for screening for malignant neoplasm, site unspecified: Secondary | ICD-10-CM | POA: Insufficient documentation

## 2023-10-23 DIAGNOSIS — Z1211 Encounter for screening for malignant neoplasm of colon: Secondary | ICD-10-CM | POA: Insufficient documentation

## 2023-10-25 ENCOUNTER — Encounter: Payer: Self-pay | Admitting: Gastroenterology

## 2023-11-08 ENCOUNTER — Other Ambulatory Visit: Payer: Self-pay | Admitting: *Deleted

## 2023-11-08 DIAGNOSIS — C50412 Malignant neoplasm of upper-outer quadrant of left female breast: Secondary | ICD-10-CM

## 2023-11-09 ENCOUNTER — Ambulatory Visit: Payer: BC Managed Care – PPO | Admitting: Adult Health

## 2023-11-09 ENCOUNTER — Inpatient Hospital Stay: Payer: BC Managed Care – PPO | Admitting: Adult Health

## 2023-11-09 ENCOUNTER — Inpatient Hospital Stay: Payer: BC Managed Care – PPO | Attending: Adult Health

## 2023-11-09 ENCOUNTER — Other Ambulatory Visit: Payer: BC Managed Care – PPO

## 2023-11-12 ENCOUNTER — Encounter: Payer: Self-pay | Admitting: Adult Health

## 2023-11-12 ENCOUNTER — Inpatient Hospital Stay: Attending: Adult Health

## 2023-11-12 ENCOUNTER — Inpatient Hospital Stay (HOSPITAL_BASED_OUTPATIENT_CLINIC_OR_DEPARTMENT_OTHER): Admitting: Adult Health

## 2023-11-12 VITALS — BP 141/71 | HR 78 | Temp 98.6°F | Resp 18 | Ht 62.0 in | Wt 170.5 lb

## 2023-11-12 DIAGNOSIS — Z8049 Family history of malignant neoplasm of other genital organs: Secondary | ICD-10-CM | POA: Insufficient documentation

## 2023-11-12 DIAGNOSIS — Z801 Family history of malignant neoplasm of trachea, bronchus and lung: Secondary | ICD-10-CM | POA: Diagnosis not present

## 2023-11-12 DIAGNOSIS — Z170 Estrogen receptor positive status [ER+]: Secondary | ICD-10-CM | POA: Insufficient documentation

## 2023-11-12 DIAGNOSIS — Z800 Family history of malignant neoplasm of digestive organs: Secondary | ICD-10-CM | POA: Diagnosis not present

## 2023-11-12 DIAGNOSIS — Z79811 Long term (current) use of aromatase inhibitors: Secondary | ICD-10-CM | POA: Diagnosis not present

## 2023-11-12 DIAGNOSIS — Z8041 Family history of malignant neoplasm of ovary: Secondary | ICD-10-CM | POA: Insufficient documentation

## 2023-11-12 DIAGNOSIS — C50412 Malignant neoplasm of upper-outer quadrant of left female breast: Secondary | ICD-10-CM

## 2023-11-12 LAB — CMP (CANCER CENTER ONLY)
ALT: 19 U/L (ref 0–44)
AST: 21 U/L (ref 15–41)
Albumin: 4.3 g/dL (ref 3.5–5.0)
Alkaline Phosphatase: 78 U/L (ref 38–126)
Anion gap: 6 (ref 5–15)
BUN: 13 mg/dL (ref 6–20)
CO2: 29 mmol/L (ref 22–32)
Calcium: 9.5 mg/dL (ref 8.9–10.3)
Chloride: 105 mmol/L (ref 98–111)
Creatinine: 0.89 mg/dL (ref 0.44–1.00)
GFR, Estimated: >60 mL/min (ref >60)
Glucose, Bld: 91 mg/dL (ref 70–99)
Potassium: 3.8 mmol/L (ref 3.5–5.1)
Sodium: 140 mmol/L (ref 135–145)
Total Bilirubin: 0.5 mg/dL (ref 0.0–1.2)
Total Protein: 6.7 g/dL (ref 6.5–8.1)

## 2023-11-12 LAB — CBC WITH DIFFERENTIAL (CANCER CENTER ONLY)
Abs Immature Granulocytes: 0.01 10*3/uL (ref 0.00–0.07)
Basophils Absolute: 0.1 10*3/uL (ref 0.0–0.1)
Basophils Relative: 1 %
Eosinophils Absolute: 0.4 10*3/uL (ref 0.0–0.5)
Eosinophils Relative: 7 %
HCT: 38.3 % (ref 36.0–46.0)
Hemoglobin: 13.3 g/dL (ref 12.0–15.0)
Immature Granulocytes: 0 %
Lymphocytes Relative: 34 %
Lymphs Abs: 1.9 10*3/uL (ref 0.7–4.0)
MCH: 30.9 pg (ref 26.0–34.0)
MCHC: 34.7 g/dL (ref 30.0–36.0)
MCV: 89.1 fL (ref 80.0–100.0)
Monocytes Absolute: 0.4 10*3/uL (ref 0.1–1.0)
Monocytes Relative: 7 %
Neutro Abs: 2.9 10*3/uL (ref 1.7–7.7)
Neutrophils Relative %: 51 %
Platelet Count: 217 10*3/uL (ref 150–400)
RBC: 4.3 MIL/uL (ref 3.87–5.11)
RDW: 12 % (ref 11.5–15.5)
WBC Count: 5.7 10*3/uL (ref 4.0–10.5)
nRBC: 0 % (ref 0.0–0.2)

## 2023-11-12 NOTE — Progress Notes (Unsigned)
 [facility] Cancer Follow up:    [name], MD [address] [place] [address]   DIAGNOSIS: Cancer Staging  Malignant neoplasm of upper-outer quadrant of left breast in female, estrogen receptor positive (HCC) Staging form: Breast, AJCC 8th Edition - Clinical: Stage IA (cT1a, cN0, cM0, G1, ER+, PR+, HER2: Equivocal) - Unsigned Stage prefix: Initial diagnosis Method of lymph node assessment: Clinical Histologic grading system: 3 grade system - Pathologic stage from 09/05/2021: Stage IA (pT1a, pN0, cM0, G1, ER+, PR+, HER2-) - Signed by [name], MD on 11/07/2021 Stage prefix: Initial diagnosis Histologic grading system: 3 grade system Residual tumor (R): R0    SUMMARY OF ONCOLOGIC HISTORY: Oncology History Overview Note   Cancer Staging  Malignant neoplasm of upper-outer quadrant of left breast in female, estrogen receptor positive (HCC) Staging form: Breast, AJCC 8th Edition - Clinical: Stage IA (cT1a, cN0, cM0, G1, ER+, PR+, HER2: Equivocal) - Unsigned - Pathologic stage from 09/05/2021: Stage IA (pT1a, pN0, cM0, G1, ER+, PR+, HER2-) - Signed by Sonja [place], MD on 11/07/2021    Malignant neoplasm of upper-outer quadrant of left breast in female, estrogen receptor positive (HCC)  07/31/2021 Imaging   CLINICAL DATA:  Screening recall for a left breast asymmetry.   EXAM: DIGITAL DIAGNOSTIC UNILATERAL LEFT MAMMOGRAM WITH TOMOSYNTHESIS AND CAD; ULTRASOUND LEFT BREAST LIMITED  IMPRESSION: 1. There is an indeterminate 3 mm mass in the left breast at 2 o'clock.   2.  No evidence of left axillary lymphadenopathy.   08/04/2021 Initial Biopsy   Diagnosis Breast, left, needle core biopsy, UOQ, 2 o'clock, 3cmfn, ribbon clip - INVASIVE DUCTAL CARCINOMA - DUCTAL CARCINOMA IN SITU - SEE COMMENT  Microscopic Comment Based on the biopsy, the carcinoma appears Nottingham grade 1 of 3 and measures 0.5 cm in greatest linear extent.  PROGNOSTIC  INDICATORS Results: The tumor cells are EQUIVOCAL for Her2 (2+). Her2 by FISH will be performed and results reported separately. 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 Of note, a variant of uncertain significance was detected in the RAD51C gene ( p.I52L). Report date is 08/27/2021.  [employer] CustomNext-Cancer+RNAinsight panel offered by [employer] includes sequencing and rearrangement analysis for the following 52 genes:  APC, ATM, AXIN2, BAP1, BARD1, BMPR1A, BRCA1, BRCA2, BRIP1, CDH1, CDK4, CDKN2A, CHEK2, CTNNA1, DICER1, EPCAM, FH, FLCN, GREM1, HOXB13, KIT, MEN1, MET, MITF, MLH1, MSH2, MSH3, MSH6, MUTYH, NBN, NF1, NTHL1, PALB2, PDGFRA, PMS2, POLD1, POLE, PTEN, RAD50, RAD51C, RAD51D, SDHA, SDHB, SDHC, SDHD, SMAD4, SMARCA4, STK11, TP53, TSC1, TSC2, and VHL.  RNA data is routinely analyzed for use in variant interpretation for all genes.    09/05/2021 Cancer Staging   Staging form: Breast, AJCC 8th Edition - Pathologic stage from 09/05/2021: Stage IA (pT1a, pN0, cM0, G1, ER+, PR+, HER2-) - Signed by [name], MD on 11/07/2021 Stage prefix: Initial diagnosis Histologic grading system: 3 grade system Residual tumor (R): R0 - None   09/05/2021 Definitive Surgery   FINAL MICROSCOPIC DIAGNOSIS:   A. LYMPH NODE, LEFT AXILLARY #1, SENTINEL, EXCISION:  -  No carcinoma identified in one lymph node (0/1)   B. LYMPH NODE, LEFT AXILLARY, SENTINEL, EXCISION:  -  No carcinoma identified in one lymph  node (0/1)   C. LYMPH NODE, LEFT AXILLARY, SENTINEL, EXCISION:  -  No carcinoma identified in one lymph node (0/1)   D. LYMPH NODE, LEFT AXILLARY, SENTINEL,  EXCISION:  -  No carcinoma identified in one lymph node (0/1)   E. BREAST, LEFT, LUMPECTOMY:  -  Invasive ductal carcinoma, Nottingham grade 1 of 3, 0.3 cm  -  Ductal carcinoma in-situ, low-grade  -  Calcifications associated with carcinoma  -  Margins uninvolved by carcinoma (0.25 cm; superior margin; see part F for final margin status)  -  Previous biopsy site changes present  -  See oncology table and comment below   F. BREAST, LEFT SUPERIOR AND DEEP MARGIN, EXCISION:  -  No residual carcinoma identified   G. BREAST, LEFT LATERAL MARGIN, EXCISION:  -  No residual carcinoma identified    10/13/2021 - 11/07/2021 Radiation Therapy   Site Technique Total Dose (Gy) Dose per Fx (Gy) Completed Fx Beam Energies  Breast, Left: Breast_L 3D 42.56/42.56 2.66 16/16 6XFFF  Breast, Left: Breast_L_Bst 3D 8/8 2 4/4 6X     11/2021 -  Anti-estrogen oral therapy   Anastrozole      CURRENT THERAPY:  INTERVAL HISTORY:  Discussed the use of AI scribe software for clinical note transcription with the patient, who gave verbal consent to proceed.  Wanda Castro 58 y.o. female returns for    Patient Active Problem List   Diagnosis Date Noted   Special screening for cancer 10/23/2023   Colon cancer screening 10/23/2023   Impacted cerumen of both ears 10/04/2023   Heart murmur 05/05/2022   Monoallelic mutation of CDKN2A gene 09/08/2021   Genetic testing 08/25/2021   Family history of colon cancer 08/13/2021   Family history of ovarian cancer 08/13/2021   Malignant neoplasm of upper-outer quadrant of left breast in female, estrogen receptor positive (HCC) 08/11/2021   Hypertensive disorder 06/21/2019   Migraine 06/21/2019    is allergic to adhesive [tape].  MEDICAL HISTORY: Past Medical History:  Diagnosis Date   Anxiety    Breast cancer (HCC)    left   GERD (gastroesophageal reflux disease)    Heart murmur    Hypertension    Migraine    Personal history of radiation therapy     Right ureteral stone    Wears contact lenses     SURGICAL HISTORY: Past Surgical History:  Procedure Laterality Date   BASAL CELL CARCINOMA EXCISION     face   BREAST BIOPSY Left 08/04/2021   BREAST LUMPECTOMY Left 09/05/2021   BREAST LUMPECTOMY WITH RADIOACTIVE SEED AND SENTINEL LYMPH NODE BIOPSY Left 09/05/2021   Procedure: LEFT BREAST LUMPECTOMY WITH RADIOACTIVE SEED AND SENTINEL LYMPH NODE BIOPSY;  Surgeon: Caralyn Chandler, MD;  Location: [facility];  Service: General;  Laterality: Left;   COLONOSCOPY  2015 approx    SOCIAL HISTORY: Social History   Socioeconomic History   Marital status: Single    Spouse name: Not on file   Number of children: 0   Years of education: Not on file   Highest education level: Not on file  Occupational History   Occupation: She works in administration in [employer]  Tobacco Use   Smoking status: Never   Smokeless tobacco: Never  Substance and Sexual Activity   Alcohol use: No   Drug use: No   Sexual activity: Not on file  Other Topics Concern   Not on file  Social History Narrative   Not on file  Social Drivers of Corporate investment banker Strain: Not on file  Food Insecurity: Not on file  Transportation Needs: Not on file  Physical Activity: Not on file  Stress: Not on file  Social Connections: Not on file  Intimate Partner Violence: Not on file    FAMILY HISTORY: Family History  Problem Relation Age of Onset   Atrial fibrillation Mother    COPD Mother    Hypertension Father    Renal cancer Father 38   Diabetes Sister    Diabetes Brother    Colon cancer Maternal Uncle 55   Lung cancer Paternal Aunt        small cell lung cancer   Prostate cancer Paternal Uncle    Prostate cancer Paternal Uncle    Ovarian cancer Cousin 17       maternal first cousin   Lung cancer Cousin        paternal first cousin   Lung cancer Cousin        paternal first cousin   Uterine cancer Cousin         paternal first cousin, dx. 30s   Ovarian cancer Cousin        dx. 30s   Colon cancer Cousin        paternal first cousin, dx. 73s   Esophageal cancer Cousin    Inflammatory bowel disease Neg Hx    Liver disease Neg Hx    Pancreatic cancer Neg Hx    Stomach cancer Neg Hx    Rectal cancer Neg Hx     Review of Systems - Oncology    PHYSICAL EXAMINATION    Vitals:   11/12/23 1401  BP: (!) 141/71  Pulse: 78  Resp: 18  Temp: 98.6 F (37 C)  SpO2: 99%    Physical Exam  LABORATORY DATA:  CBC    Component Value Date/Time   WBC 5.7 11/12/2023 1337   WBC 11.2 (H) 11/28/2015 2326   RBC 4.30 11/12/2023 1337   HGB 13.3 11/12/2023 1337   HCT 38.3 11/12/2023 1337   PLT 217 11/12/2023 1337   MCV 89.1 11/12/2023 1337   MCH 30.9 11/12/2023 1337   MCHC 34.7 11/12/2023 1337   RDW 12.0 11/12/2023 1337   LYMPHSABS 1.9 11/12/2023 1337   MONOABS 0.4 11/12/2023 1337   EOSABS 0.4 11/12/2023 1337   BASOSABS 0.1 11/12/2023 1337    CMP     Component Value Date/Time   NA 140 11/12/2023 1337   K 3.8 11/12/2023 1337   CL 105 11/12/2023 1337   CO2 29 11/12/2023 1337   GLUCOSE 91 11/12/2023 1337   BUN 13 11/12/2023 1337   CREATININE 0.89 11/12/2023 1337   CALCIUM 9.5 11/12/2023 1337   PROT 6.7 11/12/2023 1337   ALBUMIN 4.3 11/12/2023 1337   AST 21 11/12/2023 1337   ALT 19 11/12/2023 1337   ALKPHOS 78 11/12/2023 1337   BILITOT 0.5 11/12/2023 1337   GFRNONAA >60 11/12/2023 1337   GFRAA >60 11/28/2015 2326     ASSESSMENT and THERAPY PLAN:   No problem-specific Assessment & Plan notes found for this encounter.     All questions were answered. The patient knows to call the clinic with any problems, questions or concerns. We can certainly see the patient much sooner if necessary.  Total encounter time:*** minutes*in face-to-face visit time, chart review, lab review, care coordination, order entry, and documentation of the encounter time.    Alwin Baars, NP 11/12/23  2:20 PM Medical Oncology  and Hematology [facility] [address] [place], [place] [address] Tel. [phone]    Fax. [phone]  *Total Encounter Time as defined by the Centers for Medicare and Medicaid Services includes, in addition to the face-to-face time of a patient visit (documented in the note above) non-face-to-face time: obtaining and reviewing outside history, ordering and reviewing medications, tests or procedures, care coordination (communications with other health care professionals or caregivers) and documentation in the medical record.

## 2023-11-16 NOTE — Assessment & Plan Note (Signed)
 Wanda Castro is a 58 year old woman with history of stage Ia ER/PR positive left-sided breast cancer diagnosed in January 2023 status post lumpectomy followed by adjuvant radiation and antiestrogen therapy with anastrozole .  Stage IA left sided ER/PR positive breast cancer: She has no clinical or radiographic sign of breast cancer recurrence.  She is tolerating anastrozole  daily and will continue this. Bone health: Stable.  Will continue with every 2-year bone density testing with her primary care provider and I continue to recommend calcium vitamin D and weightbearing exercises. CDK2NA mutation: We reviewed updated genetic recommendations based on March.2025 NCCN guidelines.  She is up-to-date with the recommendations with pancreatic cancer screening and skin cancer screening with dermatology. Health maintenance: I recommended that she continue to follow-up with her primary care provider and gynecologist.  Healthy diet and exercise was recommended.   Wanda Castro will RTC in 6 months for labs and f/u.

## 2023-11-19 ENCOUNTER — Encounter: Payer: Self-pay | Admitting: Adult Health

## 2023-11-30 ENCOUNTER — Telehealth: Payer: Self-pay | Admitting: Gastroenterology

## 2023-11-30 NOTE — Telephone Encounter (Signed)
 The patient is scheduled for US  on 5/29 at [facility] and the times dont match as far as the letter is concerned. Please verify information for the patient.

## 2023-11-30 NOTE — Telephone Encounter (Signed)
 The pt has been advised to follow the instructions given to her by the hospital. I did make her aware that she will get several phone calls from [facility] in the next few days

## 2023-12-01 ENCOUNTER — Telehealth: Payer: Self-pay | Admitting: Gastroenterology

## 2023-12-01 NOTE — Telephone Encounter (Signed)
 Procedure:Upper Endoscopic Ultrasound (EUS) Procedure date: 12/09/23 Procedure location: [facility] Arrival Time: 2:30 pm Spoke with the patient Y/N: Yes Any prep concerns? No  Has the patient obtained the prep from the pharmacy ? No prep needed Do you have a care partner and transportation: Yes Any additional concerns? No

## 2023-12-03 ENCOUNTER — Encounter (HOSPITAL_COMMUNITY): Payer: Self-pay | Admitting: Gastroenterology

## 2023-12-09 ENCOUNTER — Encounter (HOSPITAL_COMMUNITY)
Admission: RE | Disposition: A | Discharge status: Home / Self Care | Payer: Self-pay | Source: Home / Self Care | Attending: Gastroenterology

## 2023-12-09 ENCOUNTER — Other Ambulatory Visit: Payer: Self-pay

## 2023-12-09 ENCOUNTER — Encounter (HOSPITAL_COMMUNITY): Payer: Self-pay | Admitting: Gastroenterology

## 2023-12-09 ENCOUNTER — Ambulatory Visit (HOSPITAL_COMMUNITY): Admitting: Certified Registered Nurse Anesthetist

## 2023-12-09 ENCOUNTER — Ambulatory Visit (HOSPITAL_COMMUNITY)
Admission: RE | Admit: 2023-12-09 | Discharge: 2023-12-09 | Disposition: A | Discharge status: Home / Self Care | Attending: Gastroenterology | Admitting: Gastroenterology

## 2023-12-09 DIAGNOSIS — K7689 Other specified diseases of liver: Secondary | ICD-10-CM | POA: Insufficient documentation

## 2023-12-09 DIAGNOSIS — K2950 Unspecified chronic gastritis without bleeding: Secondary | ICD-10-CM | POA: Diagnosis not present

## 2023-12-09 DIAGNOSIS — K3189 Other diseases of stomach and duodenum: Secondary | ICD-10-CM | POA: Insufficient documentation

## 2023-12-09 DIAGNOSIS — Z1509 Genetic susceptibility to other malignant neoplasm: Secondary | ICD-10-CM | POA: Diagnosis not present

## 2023-12-09 DIAGNOSIS — K2960 Other gastritis without bleeding: Secondary | ICD-10-CM

## 2023-12-09 DIAGNOSIS — Z1289 Encounter for screening for malignant neoplasm of other sites: Secondary | ICD-10-CM | POA: Diagnosis present

## 2023-12-09 DIAGNOSIS — K449 Diaphragmatic hernia without obstruction or gangrene: Secondary | ICD-10-CM

## 2023-12-09 DIAGNOSIS — I10 Essential (primary) hypertension: Secondary | ICD-10-CM | POA: Diagnosis not present

## 2023-12-09 DIAGNOSIS — I899 Noninfective disorder of lymphatic vessels and lymph nodes, unspecified: Secondary | ICD-10-CM

## 2023-12-09 DIAGNOSIS — K2289 Other specified disease of esophagus: Secondary | ICD-10-CM | POA: Diagnosis not present

## 2023-12-09 DIAGNOSIS — Z1501 Genetic susceptibility to malignant neoplasm of breast: Secondary | ICD-10-CM

## 2023-12-09 DIAGNOSIS — K259 Gastric ulcer, unspecified as acute or chronic, without hemorrhage or perforation: Secondary | ICD-10-CM | POA: Diagnosis not present

## 2023-12-09 DIAGNOSIS — Z129 Encounter for screening for malignant neoplasm, site unspecified: Secondary | ICD-10-CM

## 2023-12-09 HISTORY — DX: Other specified postprocedural states: Z98.890

## 2023-12-09 HISTORY — DX: Other specified postprocedural states: R11.2

## 2023-12-09 HISTORY — PX: ESOPHAGOGASTRODUODENOSCOPY: SHX5428

## 2023-12-09 HISTORY — PX: EUS: SHX5427

## 2023-12-09 SURGERY — ULTRASOUND, UPPER GI TRACT, ENDOSCOPIC
Anesthesia: Monitor Anesthesia Care

## 2023-12-09 MED ORDER — LIDOCAINE 2% (20 MG/ML) 5 ML SYRINGE
INTRAMUSCULAR | Status: DC | PRN
  Administered 2023-12-09: 40 mg via INTRAVENOUS

## 2023-12-09 MED ORDER — MIDAZOLAM HCL 2 MG/2ML IJ SOLN
INTRAMUSCULAR | Status: AC
Start: 2023-12-09 — End: ?
  Filled 2023-12-09: qty 2

## 2023-12-09 MED ORDER — MIDAZOLAM HCL 2 MG/2ML IJ SOLN
INTRAMUSCULAR | Status: DC | PRN
  Administered 2023-12-09: 2 mg via INTRAVENOUS

## 2023-12-09 MED ORDER — PANTOPRAZOLE SODIUM 40 MG PO TBEC: 40.0000 mg | DELAYED_RELEASE_TABLET | Freq: Every day | ORAL | 1 refills | Status: DC

## 2023-12-09 MED ORDER — SODIUM CHLORIDE 0.9 % IV SOLN: INTRAVENOUS | Status: DC

## 2023-12-09 MED ORDER — PROPOFOL 500 MG/50ML IV EMUL
INTRAVENOUS | Status: DC | PRN
  Administered 2023-12-09: 125 ug/kg/min via INTRAVENOUS

## 2023-12-09 MED ORDER — PROPOFOL 10 MG/ML IV BOLUS
INTRAVENOUS | Status: DC | PRN
Start: 2023-12-09 — End: 2023-12-09
  Administered 2023-12-09: 30 mg via INTRAVENOUS

## 2023-12-09 NOTE — Op Note (Signed)
 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 Estimated Blood Loss:     Estimated blood loss was minimal. Procedure:                Pre-Anesthesia Assessment:                           - Prior to the procedure, a History and Physical                            was performed, and patient medications and                            allergies were reviewed. The patient's tolerance of                            previous anesthesia was also reviewed. The risks                            and benefits of the procedure and the sedation                            options and risks were discussed with the patient.                            All questions were answered, and informed consent                            was obtained. Prior Anticoagulants: The patient has                            taken no anticoagulant or  antiplatelet agents. ASA                            Grade Assessment: II - A patient with mild systemic                            disease. After reviewing the risks and benefits,                            the patient was deemed in satisfactory condition to                            undergo the procedure.                           After obtaining informed  consent, the endoscope was                            passed under direct vision. Throughout the                            procedure, the patient's blood pressure, pulse, and                            oxygen saturations were monitored continuously. The                            [id number]-[id number] ([id number]) Olympus endoscope was introduced                            through the mouth, and advanced to the second part                            of duodenum. The [id number]-[id number] ([id number]) Olympus                            duodenoscope was introduced through the mouth, and                            advanced to the area of papilla. The [id number]                            ([id number]) Olympus linear ultrasound scope was                            introduced through the mouth, and advanced to the                            duodenum for ultrasound examination from the                            stomach and duodenum. The upper EUS was  accomplished without difficulty. The patient                            tolerated the procedure. Scope In: Scope Out: Findings:      ENDOSCOPIC FINDING: :      No gross lesions were noted in the entire esophagus.      The Z-line was irregular and was found 35 cm from the incisors.      A 3 cm hiatal hernia was present.      Multiple dispersed small erosions with no bleeding and no stigmata of       recent bleeding were found in the entire examined stomach. Biopsies were       taken with a cold forceps for histology and Helicobacter pylori testing.      No gross lesions were noted in the duodenal bulb, in the first portion       of the duodenum and in the second portion of the duodenum.      The major papilla was normal.      ENDOSONOGRAPHIC FINDING: :      There was no sign of significant endosonographic abnormality in the       pancreatic head (PD = 2.3 mm), genu of the pancreas (PD = 1.4 mm),       pancreatic body (PD = 1.3 mm) and pancreatic tail  (PD = 0.4 mm). No       masses, no cysts, no calcifications, the pancreatic duct was thin in       caliber.      There was no sign of significant endosonographic abnormality in the       common bile duct and in the common hepatic duct. An unremarkable       gallbladder and ducts of normal caliber were identified.      Endosonographic imaging of the ampulla showed no intramural       (subepithelial) lesion.      A cyst was found in the visualized portion of the liver and measured 18       mm by 17 mm in maximal cross-sectional diameter. The cyst was anechoic.       It was without septae. The outer wall of the lesion was thin. No other       masses or lesions were noted in the visualized portion of the liver.      No malignant-appearing lymph nodes were visualized in the celiac region       (level 20), peripancreatic region and porta hepatis region.      The celiac region was visualized. Impression:               EGD impression:                           - No gross lesions in the entire esophagus. Z-line                            irregular, 35 cm from the incisors.                           - 3 cm hiatal hernia.                           - Erosive gastropathy  with no bleeding and no                            stigmata of recent bleeding. Biopsied.                           - No gross lesions in the duodenal bulb, in the                            first portion of the duodenum and in the second                            portion of the duodenum.                           - Normal major papilla.                           EUS impression:                           - There was no sign of significant pathology in the                            pancreatic head, genu of the pancreas, pancreatic                            body and pancreatic tail.                           - There was no sign of significant pathology in the                            common bile duct and in the common hepatic  duct.                            The gallbladder was unremarkable.                           - A cyst was found in the visualized portion of the                            liver and measured 18 mm by 17 mm. No other masses                            or lesions noted.                           - No malignant-appearing lymph nodes were                            visualized in the celiac region (level 20),  peripancreatic region and porta hepatis region. Moderate Sedation:      Not Applicable - Patient had care per Anesthesia. Recommendation:           - The patient will be observed post-procedure,                            until all discharge criteria are met.                           - Discharge patient to home.                           - Patient has a contact number available for                            emergencies. The signs and symptoms of potential                            delayed complications were discussed with the                            patient. Return to normal activities tomorrow.                            Written discharge instructions were provided to the                            patient.                           - Resume previous diet.                           - Observe patient's clinical course.                           - Await path results.                           - For future surveillance for pancreatic cancer,                            cross-sectional imaging is recommended. The timing                            of future studies Will be in 1 year for MRI/MRCP.                            Pending this is okay EUS 1 year later.                           - The findings and recommendations were discussed                            with the patient.                           -  The findings and recommendations were discussed                            with the patient's family. Procedure Code(s):        --- Professional ---                            [phone], Esophagogastroduodenoscopy, flexible,                            transoral; with endoscopic ultrasound examination                            limited to the esophagus, stomach or duodenum, and                            adjacent structures                           43239, Esophagogastroduodenoscopy, flexible,                            transoral; with biopsy, single or multiple Diagnosis Code(s):        --- Professional ---                           K22.89, Other specified disease of esophagus                           K44.9, Diaphragmatic hernia without obstruction or                            gangrene                           K31.89, Other diseases of stomach and duodenum                           K76.89, Other specified diseases of liver                           I89.9, Noninfective disorder of lymphatic vessels                            and lymph nodes, unspecified                           Z12.89, Encounter for screening for malignant                            neoplasm of other sites                           Z15.09, Genetic susceptibility to other malignant                            neoplasm CPT copyright 2022 [employer] Association. All rights  reserved. The codes documented in this report are preliminary and upon coder review may  be revised to meet current compliance requirements. Yong Henle, MD 12/09/2023 3:56:38 PM Number of Addenda: 0

## 2023-12-09 NOTE — Discharge Instructions (Signed)
YOU HAD AN ENDOSCOPIC PROCEDURE TODAY: Refer to the procedure report and other information in the discharge instructions given to you for any specific questions about what was found during the examination. If this information does not answer your questions, please call [place] office at [phone] to clarify.   YOU SHOULD EXPECT: Some feelings of bloating in the abdomen. Passage of more gas than usual. Walking can help get rid of the air that was put into your GI tract during the procedure and reduce the bloating. If you had a lower endoscopy (such as a colonoscopy or flexible sigmoidoscopy) you may notice spotting of blood in your stool or on the toilet paper. Some abdominal soreness may be present for a day or two, also.  DIET: Your first meal following the procedure should be a light meal and then it is ok to progress to your normal diet. A half-sandwich or bowl of soup is an example of a good first meal. Heavy or fried foods are harder to digest and may make you feel nauseous or bloated. Drink plenty of fluids but you should avoid alcoholic beverages for 24 hours. If you had a esophageal dilation, please see attached instructions for diet.    ACTIVITY: Your care partner should take you home directly after the procedure. You should plan to take it easy, moving slowly for the rest of the day. You can resume normal activity the day after the procedure however YOU SHOULD NOT DRIVE, use power tools, machinery or perform tasks that involve climbing or major physical exertion for 24 hours (because of the sedation medicines used during the test).   SYMPTOMS TO REPORT IMMEDIATELY: A gastroenterologist can be reached at any hour. Please call [phone]  for any of the following symptoms:  Following lower endoscopy (colonoscopy, flexible sigmoidoscopy) Excessive amounts of blood in the stool  Significant tenderness, worsening of abdominal pains  Swelling of the abdomen that is new, acute  Fever of 100 or  higher  Following upper endoscopy (EGD, EUS, ERCP, esophageal dilation) Vomiting of blood or coffee ground material  New, significant abdominal pain  New, significant chest pain or pain under the shoulder blades  Painful or persistently difficult swallowing  New shortness of breath  Black, tarry-looking or red, bloody stools  FOLLOW UP:  If any biopsies were taken you will be contacted by phone or by letter within the next 1-3 weeks. Call [phone]  if you have not heard about the biopsies in 3 weeks.  Please also call with any specific questions about appointments or follow up tests.YOU HAD AN ENDOSCOPIC PROCEDURE TODAY: Refer to the procedure report and other information in the discharge instructions given to you for any specific questions about what was found during the examination. If this information does not answer your questions, please call [place] office at [phone] to clarify.   YOU SHOULD EXPECT: Some feelings of bloating in the abdomen. Passage of more gas than usual. Walking can help get rid of the air that was put into your GI tract during the procedure and reduce the bloating. If you had a lower endoscopy (such as a colonoscopy or flexible sigmoidoscopy) you may notice spotting of blood in your stool or on the toilet paper. Some abdominal soreness may be present for a day or two, also.  DIET: Your first meal following the procedure should be a light meal and then it is ok to progress to your normal diet. A half-sandwich or bowl of soup is an example of a  good first meal. Heavy or fried foods are harder to digest and may make you feel nauseous or bloated. Drink plenty of fluids but you should avoid alcoholic beverages for 24 hours. If you had a esophageal dilation, please see attached instructions for diet.    ACTIVITY: Your care partner should take you home directly after the procedure. You should plan to take it easy, moving slowly for the rest of the day. You can resume  normal activity the day after the procedure however YOU SHOULD NOT DRIVE, use power tools, machinery or perform tasks that involve climbing or major physical exertion for 24 hours (because of the sedation medicines used during the test).   SYMPTOMS TO REPORT IMMEDIATELY: A gastroenterologist can be reached at any hour. Please call [phone]  for any of the following symptoms:  Following upper endoscopy (EGD, EUS, ERCP, esophageal dilation) Vomiting of blood or coffee ground material  New, significant abdominal pain  New, significant chest pain or pain under the shoulder blades  Painful or persistently difficult swallowing  New shortness of breath  Black, tarry-looking or red, bloody stools  FOLLOW UP:  If any biopsies were taken you will be contacted by phone or by letter within the next 1-3 weeks. Call [phone]  if you have not heard about the biopsies in 3 weeks.  Please also call with any specific questions about appointments or follow up tests.

## 2023-12-09 NOTE — Anesthesia Postprocedure Evaluation (Signed)
 Anesthesia Post Note  Patient: Wanda Castro  Procedure(s) Performed: ULTRASOUND, UPPER GI TRACT, ENDOSCOPIC     Patient location during evaluation: Endoscopy Anesthesia Type: MAC Level of consciousness: awake Pain management: pain level controlled Vital Signs Assessment: post-procedure vital signs reviewed and stable Respiratory status: spontaneous breathing, nonlabored ventilation and respiratory function stable Cardiovascular status: blood pressure returned to baseline and stable Postop Assessment: no apparent nausea or vomiting Anesthetic complications: no   No notable events documented.  Last Vitals:  Vitals:   12/09/23 1550 12/09/23 1600  BP: (!) 124/58 124/82  Pulse: 94 91  Resp: 19 18  Temp:    SpO2: 98% 96%    Last Pain:  Vitals:   12/09/23 1600  TempSrc:   PainSc: 0-No pain                 Meleni Delahunt P Orlin Kann

## 2023-12-09 NOTE — H&P (Signed)
 GASTROENTEROLOGY PROCEDURE H&P NOTE   Primary Care Physician: [name], MD  HPI: Wanda Castro is a 58 y.o. 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  Past Medical History:  Diagnosis Date   Anxiety    Breast cancer (HCC)    left   GERD (gastroesophageal reflux disease)    Heart murmur    Hypertension    Migraine    Personal history of radiation therapy    PONV (postoperative nausea and vomiting)    Right ureteral stone    Wears contact lenses    Past Surgical History:  Procedure Laterality Date   BASAL CELL CARCINOMA EXCISION     face   BREAST BIOPSY Left 08/04/2021   BREAST LUMPECTOMY Left 09/05/2021   BREAST LUMPECTOMY WITH RADIOACTIVE SEED AND SENTINEL LYMPH NODE BIOPSY Left 09/05/2021   Procedure: LEFT BREAST LUMPECTOMY WITH RADIOACTIVE SEED AND SENTINEL LYMPH NODE BIOPSY;  Surgeon: Caralyn Chandler, MD;  Location: [facility];  Service: General;  Laterality: Left;   COLONOSCOPY  2015 approx   Current Facility-Administered Medications  Medication Dose Route Frequency Provider Last Rate Last Admin   0.9 %  sodium chloride  infusion   Intravenous Continuous Mansouraty, Albino Alu., MD 20 mL/hr at 12/09/23 1428 Restarted at 12/09/23 1434    Current Facility-Administered Medications:    0.9 %  sodium chloride  infusion, , Intravenous, Continuous, Mansouraty, Albino Alu., MD, Last Rate: 20 mL/hr at 12/09/23 1428, Restarted at 12/09/23 1434 Allergies  Allergen Reactions   Adhesive [Tape] Other (See Comments)    Red irritated skin   Family History  Problem Relation Age of Onset   Atrial fibrillation Mother    COPD Mother    Hypertension Father    Renal cancer Father 25   Diabetes Sister    Diabetes Brother    Colon cancer Maternal Uncle 55   Lung cancer Paternal Aunt        small cell lung cancer   Prostate cancer Paternal Uncle    Prostate cancer Paternal Uncle    Ovarian cancer  Cousin 35       maternal first cousin   Lung cancer Cousin        paternal first cousin   Lung cancer Cousin        paternal first cousin   Uterine cancer Cousin        paternal first cousin, dx. 30s   Ovarian cancer Cousin        dx. 30s   Colon cancer Cousin        paternal first cousin, dx. 35s   Esophageal cancer Cousin    Inflammatory bowel disease Neg Hx    Liver disease Neg Hx    Pancreatic cancer Neg Hx    Stomach cancer Neg Hx    Rectal cancer Neg Hx    Social History   Socioeconomic History   Marital status: Single    Spouse name: Not on file   Number of children: 0   Years of education: Not on file   Highest education level: Not on file  Occupational History   Occupation: She works in administration in [employer]  Tobacco Use   Smoking status: Never   Smokeless tobacco: Never  Substance and Sexual Activity   Alcohol use: No   Drug use: No   Sexual activity: Not on file  Other Topics Concern   Not on file  Social History Narrative  Not on file   Social Drivers of Health   Financial Resource Strain: Not on file  Food Insecurity: Not on file  Transportation Needs: Not on file  Physical Activity: Not on file  Stress: Not on file  Social Connections: Not on file  Intimate Partner Violence: Not on file    Physical Exam: Today's Vitals   12/09/23 1425  BP: 134/67  Pulse: 81  Resp: 13  Temp: 97.7 F (36.5 C)  TempSrc: Temporal  SpO2: 99%  Weight: 75.3 kg  Height: 5\' 2"  (1.575 m)  PainSc: 0-No pain   Body mass index is 30.36 kg/m. GEN: NAD EYE: Sclerae anicteric ENT: MMM CV: Non-tachycardic GI: Soft, NT/ND NEURO:  Alert & Oriented x 3  Lab Results: No results for input(s): "WBC", "HGB", "HCT", "PLT" in the last 72 hours. BMET No results for input(s): "NA", "K", "CL", "CO2", "GLUCOSE", "BUN", "CREATININE", "CALCIUM" in the last 72 hours. LFT No results for input(s): "PROT", "ALBUMIN", "AST", "ALT", "ALKPHOS", "BILITOT",  "BILIDIR", "IBILI" in the last 72 hours. PT/INR No results for input(s): "LABPROT", "INR" in the last 72 hours.   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  The risks of an EUS including intestinal perforation, bleeding, infection, aspiration, and medication effects were discussed as was the possibility it may not give a definitive diagnosis if a biopsy is performed.  When a biopsy of the pancreas is done as part of the EUS, there is an additional risk of pancreatitis at the rate of about 1-2%.  It was explained that procedure related pancreatitis is typically mild, although it can be severe and even life threatening, which is why we do not perform random pancreatic biopsies and only biopsy a lesion/area we feel is concerning enough to warrant the risk.  The risks and benefits of endoscopic evaluation/treatment were discussed with the patient and/or family; these include but are not limited to the risk of perforation, infection, bleeding, missed lesions, lack of diagnosis, severe illness requiring hospitalization, as well as anesthesia and sedation related illnesses.  The patient's history has been reviewed, patient examined, no change in status, and deemed stable for procedure.  The patient and/or family is agreeable to proceed.    Yong Henle, MD [facility] Office # [phone]

## 2023-12-09 NOTE — Anesthesia Preprocedure Evaluation (Addendum)
 Anesthesia Evaluation  Patient identified by MRN, date of birth, ID band Patient awake    Reviewed: Allergy & Precautions, NPO status , Patient's Chart, lab work & pertinent test results  History of Anesthesia Complications (+) PONV and history of anesthetic complications  Airway Mallampati: I     Mouth opening: Limited Mouth Opening  Dental  (+) Teeth Intact, Dental Advisory Given   Pulmonary neg pulmonary ROS   breath sounds clear to auscultation       Cardiovascular hypertension, Pt. on medications + Valvular Problems/Murmurs  Rhythm:Regular Rate:Normal     Neuro/Psych  Headaches  Anxiety        GI/Hepatic Neg liver ROS,GERD  ,,  Endo/Other  negative endocrine ROS    Renal/GU negative Renal ROS     Musculoskeletal negative musculoskeletal ROS (+)    Abdominal   Peds  Hematology negative hematology ROS (+)   Anesthesia Other Findings   Reproductive/Obstetrics                             Anesthesia Physical Anesthesia Plan  ASA: 2  Anesthesia Plan: MAC   Post-op Pain Management: Minimal or no pain anticipated   Induction: Intravenous  PONV Risk Score and Plan: 0 and Propofol  infusion  Airway Management Planned: Natural Airway and Nasal Cannula  Additional Equipment: None  Intra-op Plan:   Post-operative Plan:   Informed Consent: I have reviewed the patients History and Physical, chart, labs and discussed the procedure including the risks, benefits and alternatives for the proposed anesthesia with the patient or authorized representative who has indicated his/her understanding and acceptance.       Plan Discussed with: CRNA  Anesthesia Plan Comments:        Anesthesia Quick Evaluation

## 2023-12-09 NOTE — Transfer of Care (Signed)
 Immediate Anesthesia Transfer of Care Note  Patient: Wanda Castro  Procedure(s) Performed: ULTRASOUND, UPPER GI TRACT, ENDOSCOPIC  Patient Location: Endoscopy Unit  Anesthesia Type:MAC  Level of Consciousness: awake  Airway & Oxygen Therapy: Patient Spontanous Breathing and Patient connected to nasal cannula oxygen  Post-op Assessment: Report given to RN and Post -op Vital signs reviewed and stable  Post vital signs: Reviewed and stable  Last Vitals:  Vitals Value Taken Time  BP    Temp    Pulse 82 12/09/23 1538  Resp 17 12/09/23 1538  SpO2 93 % 12/09/23 1538  Vitals shown include unfiled device data.  Last Pain:  Vitals:   12/09/23 1425  TempSrc: Temporal  PainSc: 0-No pain         Complications: No notable events documented.

## 2023-12-10 ENCOUNTER — Encounter (HOSPITAL_COMMUNITY): Payer: Self-pay | Admitting: Gastroenterology

## 2023-12-13 LAB — SURGICAL PATHOLOGY

## 2023-12-15 ENCOUNTER — Ambulatory Visit: Payer: Self-pay | Admitting: Gastroenterology

## 2023-12-20 ENCOUNTER — Ambulatory Visit
Admission: RE | Admit: 2023-12-20 | Discharge: 2023-12-20 | Disposition: A | Discharge status: Home / Self Care | Payer: BC Managed Care – PPO | Source: Ambulatory Visit | Attending: Adult Health | Admitting: Adult Health

## 2023-12-20 DIAGNOSIS — C50412 Malignant neoplasm of upper-outer quadrant of left female breast: Secondary | ICD-10-CM

## 2023-12-20 DIAGNOSIS — Z1501 Genetic susceptibility to malignant neoplasm of breast: Secondary | ICD-10-CM

## 2023-12-20 MED ORDER — GADOPICLENOL 0.5 MMOL/ML IV SOLN
7.0000 mL | Freq: Once | INTRAVENOUS | Status: AC | PRN
  Administered 2023-12-20: 7 mL via INTRAVENOUS

## 2023-12-21 ENCOUNTER — Ambulatory Visit: Payer: Self-pay | Admitting: *Deleted

## 2024-01-05 ENCOUNTER — Ambulatory Visit: Admitting: Family Medicine

## 2024-01-05 VITALS — BP 130/72 | Ht 63.0 in | Wt 165.0 lb

## 2024-01-05 DIAGNOSIS — M25562 Pain in left knee: Secondary | ICD-10-CM

## 2024-01-05 MED ORDER — CELECOXIB 200 MG PO CAPS: 200.0000 mg | ORAL_CAPSULE | Freq: Two times a day (BID) | ORAL | 0 refills | Status: DC

## 2024-01-05 NOTE — Patient Instructions (Signed)
 Your pain is due to a flare of arthritis vs a small meniscus tear. Both are treated similarly. These are the different medications you can take for this: Tylenol  500mg  1-2 tabs three times a day for pain. Voltaren gel, capsaicin, aspercreme, or biofreeze topically up to four times a day may also help with pain. Some supplements that may help for arthritis: Boswellia extract, curcumin, pycnogenol Celebrex  200mg  twice a day with food for 7 days then as needed. Cortisone injections are an option if pain is severe. It's important that you continue to stay active. Do home exercises daily. Consider physical therapy to strengthen muscles around the joint that hurts to take pressure off of the joint itself. Follow up with me in 1 month.

## 2024-01-06 ENCOUNTER — Encounter: Payer: Self-pay | Admitting: Family Medicine

## 2024-01-06 NOTE — Progress Notes (Signed)
 PCP: Yolande Toribio MATSU, MD  Subjective:   HPI: Patient is a 58 y.o. female here for left knee pain.  Patient denies known injury or trauma. About 2 months ago she went to get up and was hobbling due to left knee pain. Since that time she's had difficulty turning when walking, getting up from seating position causes more pain. Bothers with flexion of the knee also. Sometimes pain at night. No catching, locking, giving out.  Past Medical History:  Diagnosis Date   Anxiety    Breast cancer (HCC)    left   GERD (gastroesophageal reflux disease)    Heart murmur    Hypertension    Migraine    Personal history of radiation therapy    PONV (postoperative nausea and vomiting)    Right ureteral stone    Wears contact lenses     Current Outpatient Medications on File Prior to Visit  Medication Sig Dispense Refill   ALPRAZolam (XANAX) 0.25 MG tablet Take 0.25 mg by mouth at bedtime as needed for anxiety.     anastrozole  (ARIMIDEX ) 1 MG tablet TAKE 1 TABLET BY MOUTH EVERY DAY 90 tablet 0   calcipotriene (DOVONOX) 0.005 % ointment Apply 1 Application topically daily.     Calcium Carb-Cholecalciferol (CALCIUM 600 + D PO) Take 1 tablet by mouth daily.     losartan-hydrochlorothiazide (HYZAAR) 100-12.5 MG tablet Take by mouth daily.     mometasone (ELOCON) 0.1 % cream mometasone 0.1 % topical cream  APPLY TO EXTERNAL EAR ONCE DAILY AS NEEDED ITCHING     nitroGLYCERIN  (NITRODUR - DOSED IN MG/24 HR) 0.2 mg/hr patch Apply 1/4th patch to affected elbows, change daily 30 patch 3   pantoprazole  (PROTONIX ) 40 MG tablet Take 1 tablet (40 mg total) by mouth daily. For 2 months then may stop. 30 tablet 1   tretinoin (RETIN-A) 0.05 % cream Apply 1 Application topically as needed.     No current facility-administered medications on file prior to visit.    Past Surgical History:  Procedure Laterality Date   BASAL CELL CARCINOMA EXCISION     face   BREAST BIOPSY Left 08/04/2021   BREAST  LUMPECTOMY Left 09/05/2021   BREAST LUMPECTOMY WITH RADIOACTIVE SEED AND SENTINEL LYMPH NODE BIOPSY Left 09/05/2021   Procedure: LEFT BREAST LUMPECTOMY WITH RADIOACTIVE SEED AND SENTINEL LYMPH NODE BIOPSY;  Surgeon: Curvin Deward MOULD, MD;  Location: [facility];  Service: General;  Laterality: Left;   COLONOSCOPY  2015 approx   ESOPHAGOGASTRODUODENOSCOPY N/A 12/09/2023   Procedure: EGD (ESOPHAGOGASTRODUODENOSCOPY);  Surgeon: Wilhelmenia Aloha Raddle., MD;  Location: [facility];  Service: Gastroenterology;  Laterality: N/A;   EUS N/A 12/09/2023   Procedure: ULTRASOUND, UPPER GI TRACT, ENDOSCOPIC;  Surgeon: Wilhelmenia Aloha Raddle., MD;  Location: [facility];  Service: Gastroenterology;  Laterality: N/A;    Allergies  Allergen Reactions   Adhesive [Tape] Other (See Comments)    Red irritated skin    BP 130/72   Ht 5' 3 (1.6 m)   Wt 165 lb (74.8 kg)   BMI 29.23 kg/m       No data to display              No data to display              Objective:  Physical Exam:  Gen: NAD, comfortable in exam room  Left knee: No gross deformity, ecchymoses, swelling. No TTP. ROM 0 - 110 degrees with normal strength. Laxity bilaterally with anterior drawer  and lachman but negative lever.  Negative post drawer. Negative valgus/varus testing.  Negative mcmurrays, apleys. Equivocal thessalys. NV intact distally.   Assessment & Plan:  1. Left knee pain - due to flare of arthritis vs small meniscus tear.  Both treated similarly.  Tylenol , topical medications, supplements reviewed.  Celebrex  (found to have some mild gastropathy on an endoscopy).  Home exercises reveiwed.  Consider injection, physical therapy in the future.  Follow up in 1 month.

## 2024-01-16 ENCOUNTER — Other Ambulatory Visit: Payer: Self-pay | Admitting: Nurse Practitioner

## 2024-01-19 ENCOUNTER — Encounter: Payer: Self-pay | Admitting: Gastroenterology

## 2024-01-28 ENCOUNTER — Encounter: Payer: Self-pay | Admitting: Advanced Practice Midwife

## 2024-01-29 ENCOUNTER — Other Ambulatory Visit: Payer: Self-pay | Admitting: Gastroenterology

## 2024-02-07 ENCOUNTER — Ambulatory Visit: Admitting: Family Medicine

## 2024-03-08 ENCOUNTER — Ambulatory Visit (INDEPENDENT_AMBULATORY_CARE_PROVIDER_SITE_OTHER): Admitting: Family Medicine

## 2024-03-08 VITALS — BP 130/68 | Ht 63.0 in | Wt 164.0 lb

## 2024-03-08 DIAGNOSIS — M6530 Trigger finger, unspecified finger: Secondary | ICD-10-CM

## 2024-03-08 DIAGNOSIS — M25562 Pain in left knee: Secondary | ICD-10-CM

## 2024-03-08 MED ORDER — CELECOXIB 200 MG PO CAPS: 200.0000 mg | ORAL_CAPSULE | Freq: Two times a day (BID) | ORAL | 2 refills | Status: AC

## 2024-03-08 MED ORDER — CYCLOBENZAPRINE HCL 10 MG PO TABS: 10.0000 mg | ORAL_TABLET | Freq: Three times a day (TID) | ORAL | 1 refills | Status: AC | PRN

## 2024-03-08 NOTE — Patient Instructions (Signed)
 Your exam is reassuring. Celebrex  twice a day with food as needed. Try the flexeril  as needed at bedtime (can use 3 times a day if it doesn't make you too sleepy). Try the band-aid splinting of the IP joint of your thumb for the trigger thumb. Follow up with me in 6 weeks but call me sooner if you're struggling.

## 2024-03-09 ENCOUNTER — Encounter: Payer: Self-pay | Admitting: Family Medicine

## 2024-03-09 NOTE — Progress Notes (Signed)
 PCP: Yolande Toribio MATSU, MD  Subjective:   HPI: Patient is a 58 y.o. female here for left knee pain.  6/25: Patient denies known injury or trauma. About 2 months ago she went to get up and was hobbling due to left knee pain. Since that time she's had difficulty turning when walking, getting up from seating position causes more pain. Bothers with flexion of the knee also. Sometimes pain at night. No catching, locking, giving out.  8/27: Patient reports she's overall been doing well. However she went to push something a couple days ago and felt worsened pain in both knees but especially the left. Today the left feels about 95% better, right 99%. Still with stiffness though in the left knee. Getting up and down and at nighttime pain is ok. Knees did feel tight yesterday when getting up. Some radiation up and down the leg from the knee. Also reports right thumb nodule with some catching of the thumb on flexion.  Past Medical History:  Diagnosis Date   Anxiety    Breast cancer (HCC)    left   GERD (gastroesophageal reflux disease)    Heart murmur    Hypertension    Migraine    Personal history of radiation therapy    PONV (postoperative nausea and vomiting)    Right ureteral stone    Wears contact lenses     Current Outpatient Medications on File Prior to Visit  Medication Sig Dispense Refill   ALPRAZolam (XANAX) 0.25 MG tablet Take 0.25 mg by mouth at bedtime as needed for anxiety.     anastrozole  (ARIMIDEX ) 1 MG tablet TAKE 1 TABLET BY MOUTH EVERY DAY 90 tablet 0   calcipotriene (DOVONOX) 0.005 % ointment Apply 1 Application topically daily.     Calcium Carb-Cholecalciferol (CALCIUM 600 + D PO) Take 1 tablet by mouth daily.     losartan-hydrochlorothiazide (HYZAAR) 100-12.5 MG tablet Take by mouth daily.     mometasone (ELOCON) 0.1 % cream mometasone 0.1 % topical cream  APPLY TO EXTERNAL EAR ONCE DAILY AS NEEDED ITCHING     nitroGLYCERIN  (NITRODUR - DOSED IN MG/24 HR)  0.2 mg/hr patch Apply 1/4th patch to affected elbows, change daily 30 patch 3   pantoprazole  (PROTONIX ) 40 MG tablet TAKE 1 TABLET (40 MG TOTAL) BY MOUTH DAILY. FOR 2 MONTHS THEN MAY STOP. 30 tablet 1   tretinoin (RETIN-A) 0.05 % cream Apply 1 Application topically as needed.     No current facility-administered medications on file prior to visit.    Past Surgical History:  Procedure Laterality Date   BASAL CELL CARCINOMA EXCISION     face   BREAST BIOPSY Left 08/04/2021   BREAST LUMPECTOMY Left 09/05/2021   BREAST LUMPECTOMY WITH RADIOACTIVE SEED AND SENTINEL LYMPH NODE BIOPSY Left 09/05/2021   Procedure: LEFT BREAST LUMPECTOMY WITH RADIOACTIVE SEED AND SENTINEL LYMPH NODE BIOPSY;  Surgeon: Curvin Deward MOULD, MD;  Location: [facility];  Service: General;  Laterality: Left;   COLONOSCOPY  2015 approx   ESOPHAGOGASTRODUODENOSCOPY N/A 12/09/2023   Procedure: EGD (ESOPHAGOGASTRODUODENOSCOPY);  Surgeon: Wilhelmenia Aloha Raddle., MD;  Location: [facility];  Service: Gastroenterology;  Laterality: N/A;   EUS N/A 12/09/2023   Procedure: ULTRASOUND, UPPER GI TRACT, ENDOSCOPIC;  Surgeon: Wilhelmenia Aloha Raddle., MD;  Location: [facility];  Service: Gastroenterology;  Laterality: N/A;    Allergies  Allergen Reactions   Adhesive [Tape] Other (See Comments)    Red irritated skin    BP 130/68   Ht 5'  3 (1.6 m)   Wt 164 lb (74.4 kg)   BMI 29.05 kg/m       No data to display              No data to display              Objective:  Physical Exam:  Gen: NAD, comfortable in exam room  Bilateral knees: No gross deformity, ecchymoses, swelling. No TTP currently. FROM with normal strength. Negative ant/post drawers. Negative valgus/varus testing. Negative lachman. Negative mcmurrays, apleys. NV intact distally.  Right thumb: Nodule over A1 pulley.  No other deformity.  Not catching/locking with IP flexion currently.   Assessment & Plan:  1. Left knee pain -  exam is reassuring today.  Suspect a flare of the arthritis.  She can take celebrex  twice a day as needed.  Can take flexeril  (has had before) to help with the tightness also.  2. Right trigger thumb - reviewed the band-aid splinting of the 1st IP joint.  Not bothersome enough to her to pursue injection at this time.

## 2024-03-20 ENCOUNTER — Ambulatory Visit: Attending: Adult Health

## 2024-03-20 VITALS — Wt 166.4 lb

## 2024-03-20 DIAGNOSIS — Z483 Aftercare following surgery for neoplasm: Secondary | ICD-10-CM | POA: Insufficient documentation

## 2024-03-20 NOTE — Therapy (Signed)
 OUTPATIENT PHYSICAL THERAPY SOZO SCREENING NOTE   Patient Name: Wanda Castro MRN: [id number] DOB:September 04, 1965, 58 y.o., female Today's Date: 03/20/2024  PCP: Yolande Toribio MATSU, MD REFERRING PROVIDER: Crawford Jacobsen Cornett*   PT End of Session - 03/20/24 1642     Visit Number 8   # unchanged due to screen only   PT Start Time 1641    PT Stop Time 1645    PT Time Calculation (min) 4 min    Activity Tolerance Patient tolerated treatment well    Behavior During Therapy [facility] for tasks assessed/performed          Past Medical History:  Diagnosis Date   Anxiety    Breast cancer (HCC)    left   GERD (gastroesophageal reflux disease)    Heart murmur    Hypertension    Migraine    Personal history of radiation therapy    PONV (postoperative nausea and vomiting)    Right ureteral stone    Wears contact lenses    Past Surgical History:  Procedure Laterality Date   BASAL CELL CARCINOMA EXCISION     face   BREAST BIOPSY Left 08/04/2021   BREAST LUMPECTOMY Left 09/05/2021   BREAST LUMPECTOMY WITH RADIOACTIVE SEED AND SENTINEL LYMPH NODE BIOPSY Left 09/05/2021   Procedure: LEFT BREAST LUMPECTOMY WITH RADIOACTIVE SEED AND SENTINEL LYMPH NODE BIOPSY;  Surgeon: Curvin Deward MOULD, MD;  Location: [facility];  Service: General;  Laterality: Left;   COLONOSCOPY  2015 approx   ESOPHAGOGASTRODUODENOSCOPY N/A 12/09/2023   Procedure: EGD (ESOPHAGOGASTRODUODENOSCOPY);  Surgeon: Wilhelmenia Aloha Raddle., MD;  Location: [facility];  Service: Gastroenterology;  Laterality: N/A;   EUS N/A 12/09/2023   Procedure: ULTRASOUND, UPPER GI TRACT, ENDOSCOPIC;  Surgeon: Wilhelmenia Aloha Raddle., MD;  Location: [facility];  Service: Gastroenterology;  Laterality: N/A;   Patient Active Problem List   Diagnosis Date Noted   Erosive gastritis 12/09/2023   Screening for cancer 12/09/2023   Special screening for cancer 10/23/2023   Colon cancer screening 10/23/2023   Impacted cerumen of both  ears 10/04/2023   Heart murmur 05/05/2022   Monoallelic mutation of CDKN2A gene 09/08/2021   Genetic testing 08/25/2021   Family history of colon cancer 08/13/2021   Family history of ovarian cancer 08/13/2021   Malignant neoplasm of upper-outer quadrant of left breast in female, estrogen receptor positive (HCC) 08/11/2021   Hypertensive disorder 06/21/2019   Migraine 06/21/2019    REFERRING DIAG: left breast cancer at risk for lymphedema  THERAPY DIAG:  Aftercare following surgery for neoplasm  PERTINENT HISTORY: Left lumpectomy and SLNB 09/05/21 with 0/3 LN positive. GERD, HTN, migraines   PRECAUTIONS: left UE Lymphedema risk, None  SUBJECTIVE: Pt returns for her first 6 month L-Dex screen.   PAIN:  Are you having pain? No.  SOZO SCREENING: Patient was assessed today using the SOZO machine to determine the lymphedema index score. This was compared to her baseline score. It was determined that she is within the recommended range when compared to her baseline and no further action is needed at this time. She will continue SOZO screenings. These are done every 3 months for 2 years post operatively followed by every 6 months for 2 years, and then annually.   L-DEX FLOWSHEETS - 03/20/24 1600       L-DEX LYMPHEDEMA SCREENING   Measurement Type Unilateral    L-DEX MEASUREMENT EXTREMITY Upper Extremity    POSITION  Standing    DOMINANT SIDE Right  At Risk Side Left    BASELINE SCORE (UNILATERAL) -0.5    L-DEX SCORE (UNILATERAL) -2    VALUE CHANGE (UNILAT) -1.5         P:Cont 6 month L-Dex screen.    Aden Berwyn Caldron, PTA 03/20/2024, 4:44 PM

## 2024-04-17 ENCOUNTER — Other Ambulatory Visit: Payer: Self-pay | Admitting: Nurse Practitioner

## 2024-05-01 ENCOUNTER — Ambulatory Visit (INDEPENDENT_AMBULATORY_CARE_PROVIDER_SITE_OTHER): Admitting: Family Medicine

## 2024-05-01 ENCOUNTER — Encounter: Payer: Self-pay | Admitting: Family Medicine

## 2024-05-01 ENCOUNTER — Ambulatory Visit
Admission: RE | Admit: 2024-05-01 | Discharge: 2024-05-01 | Disposition: A | Discharge status: Home / Self Care | Source: Ambulatory Visit | Attending: Family Medicine | Admitting: Family Medicine

## 2024-05-01 VITALS — BP 134/74 | Ht 63.0 in | Wt 164.0 lb

## 2024-05-01 DIAGNOSIS — M25562 Pain in left knee: Secondary | ICD-10-CM

## 2024-05-01 NOTE — Progress Notes (Signed)
 PCP: Yolande Toribio MATSU, MD  Subjective:   HPI: Patient is a 58 y.o. female here for left knee pain.  6/25: Patient denies known injury or trauma. About 2 months ago she went to get up and was hobbling due to left knee pain. Since that time she's had difficulty turning when walking, getting up from seating position causes more pain. Bothers with flexion of the knee also. Sometimes pain at night. No catching, locking, giving out.  8/27: Patient reports she's overall been doing well. However she went to push something a couple days ago and felt worsened pain in both knees but especially the left. Today the left feels about 95% better, right 99%. Still with stiffness though in the left knee. Getting up and down and at nighttime pain is ok. Knees did feel tight yesterday when getting up. Some radiation up and down the leg from the knee. Also reports right thumb nodule with some catching of the thumb on flexion.  10/20: Patient returns as left knee has been bothering her still. She had been getting tightness more in this knee than the right. Feels popping/clicking anterior knee at times. Has been icing, returned to taking celebrex  twice a day which have helped. Knee swells at times as well. Worse with full flexion. Pain noticed medially around joint line and superolateral to patella.  Past Medical History:  Diagnosis Date   Anxiety    Breast cancer (HCC)    left   GERD (gastroesophageal reflux disease)    Heart murmur    Hypertension    Migraine    Personal history of radiation therapy    PONV (postoperative nausea and vomiting)    Right ureteral stone    Wears contact lenses     Current Outpatient Medications on File Prior to Visit  Medication Sig Dispense Refill   ALPRAZolam (XANAX) 0.25 MG tablet Take 0.25 mg by mouth at bedtime as needed for anxiety.     anastrozole  (ARIMIDEX ) 1 MG tablet TAKE 1 TABLET BY MOUTH EVERY DAY 90 tablet 0   calcipotriene (DOVONOX) 0.005  % ointment Apply 1 Application topically daily.     Calcium Carb-Cholecalciferol (CALCIUM 600 + D PO) Take 1 tablet by mouth daily.     celecoxib  (CELEBREX ) 200 MG capsule Take 1 capsule (200 mg total) by mouth 2 (two) times daily with a meal. 60 capsule 2   cyclobenzaprine  (FLEXERIL ) 10 MG tablet Take 1 tablet (10 mg total) by mouth 3 (three) times daily as needed for muscle spasms. 30 tablet 1   losartan-hydrochlorothiazide (HYZAAR) 100-12.5 MG tablet Take by mouth daily.     mometasone (ELOCON) 0.1 % cream mometasone 0.1 % topical cream  APPLY TO EXTERNAL EAR ONCE DAILY AS NEEDED ITCHING     nitroGLYCERIN  (NITRODUR - DOSED IN MG/24 HR) 0.2 mg/hr patch Apply 1/4th patch to affected elbows, change daily 30 patch 3   pantoprazole  (PROTONIX ) 40 MG tablet TAKE 1 TABLET (40 MG TOTAL) BY MOUTH DAILY. FOR 2 MONTHS THEN MAY STOP. 30 tablet 1   tretinoin (RETIN-A) 0.05 % cream Apply 1 Application topically as needed.     No current facility-administered medications on file prior to visit.    Past Surgical History:  Procedure Laterality Date   BASAL CELL CARCINOMA EXCISION     face   BREAST BIOPSY Left 08/04/2021   BREAST LUMPECTOMY Left 09/05/2021   BREAST LUMPECTOMY WITH RADIOACTIVE SEED AND SENTINEL LYMPH NODE BIOPSY Left 09/05/2021   Procedure: LEFT BREAST LUMPECTOMY WITH  RADIOACTIVE SEED AND SENTINEL LYMPH NODE BIOPSY;  Surgeon: Curvin Deward MOULD, MD;  Location: [facility];  Service: General;  Laterality: Left;   COLONOSCOPY  2015 approx   ESOPHAGOGASTRODUODENOSCOPY N/A 12/09/2023   Procedure: EGD (ESOPHAGOGASTRODUODENOSCOPY);  Surgeon: Wilhelmenia Aloha Raddle., MD;  Location: [facility];  Service: Gastroenterology;  Laterality: N/A;   EUS N/A 12/09/2023   Procedure: ULTRASOUND, UPPER GI TRACT, ENDOSCOPIC;  Surgeon: Wilhelmenia Aloha Raddle., MD;  Location: [facility];  Service: Gastroenterology;  Laterality: N/A;    Allergies  Allergen Reactions   Adhesive [Tape] Other (See  Comments)    Red irritated skin    BP 134/74   Ht 5' 3 (1.6 m)   Wt 164 lb (74.4 kg)   BMI 29.05 kg/m       No data to display              No data to display              Objective:  Physical Exam:  Gen: NAD, comfortable in exam room  Left knee: Mild effusion.  No bruising, other deformity. Mild TTP medial joint line.  No lateral joint line, post patellar facet, other tenderness. FROM with normal strength. Negative ant/post drawers. Negative valgus/varus testing. Negative lachman. Negative mcmurrays, apleys, thessalys NV intact distally.   Assessment & Plan:  1. Left knee pain - with small effusion.  Consistent with flare of medial arthritis.  Obtain radiographs to assess.  Start physical therapy.  Discussed tylenol , topical medications, supplements.  Celebrex  twice a day as needed.  Consider injection if not improving. Follow up in 1 month.

## 2024-05-01 NOTE — Patient Instructions (Addendum)
 Your pain is due to arthritis. Get x-rays after you leave today. These are the different medications you can take for this: Tylenol  500mg  1-2 tabs three times a day for pain. Voltaren gel, capsaicin, aspercreme, or biofreeze topically up to four times a day may also help with pain. Some supplements that may help for arthritis: Boswellia extract, curcumin, pycnogenol Celebrex  twice a day with food - typically for 7 days if you have a flare then as needed. Cortisone injections are an option if pain is severe. If cortisone injections do not help, there are different types of shots that may help but they take longer to take effect. It's important that you continue to stay active. Start physical therapy to strengthen muscles around the joint that hurts to take pressure off of the joint itself.  Do home exercises on days you don't go to therapy. [facility] will call you to sched your PT appt.  Shoe inserts with good arch support may be helpful. Heat or ice 15 minutes at a time 3-4 times a day as needed to help with pain. Water aerobics and cycling with low resistance are the best two types of exercise for arthritis though any exercise is ok as long as it doesn't worsen the pain. Follow up with me in 1 month.

## 2024-05-02 ENCOUNTER — Ambulatory Visit: Payer: Self-pay | Admitting: Family Medicine

## 2024-05-02 DIAGNOSIS — Z1379 Encounter for other screening for genetic and chromosomal anomalies: Secondary | ICD-10-CM

## 2024-05-03 ENCOUNTER — Ambulatory Visit: Admitting: Family Medicine

## 2024-05-04 NOTE — Progress Notes (Signed)
 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 Amended report date is 05/01/2024.

## 2024-05-05 ENCOUNTER — Telehealth: Payer: Self-pay

## 2024-05-05 NOTE — Telephone Encounter (Signed)
 [name] called in regards to Wanda amended genetic testing report. 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 Discussed how this amended report does not change any current management recommendations. She should still follow the same management for CDKN2A.

## 2024-05-07 ENCOUNTER — Other Ambulatory Visit: Payer: Self-pay | Admitting: Gastroenterology

## 2024-05-12 ENCOUNTER — Other Ambulatory Visit: Payer: Self-pay

## 2024-05-12 DIAGNOSIS — Z170 Estrogen receptor positive status [ER+]: Secondary | ICD-10-CM

## 2024-05-15 ENCOUNTER — Inpatient Hospital Stay

## 2024-05-15 ENCOUNTER — Inpatient Hospital Stay: Attending: Adult Health | Admitting: Adult Health

## 2024-05-15 VITALS — BP 150/78 | HR 86 | Temp 98.5°F | Resp 16 | Wt 166.3 lb

## 2024-05-15 DIAGNOSIS — Z8049 Family history of malignant neoplasm of other genital organs: Secondary | ICD-10-CM | POA: Insufficient documentation

## 2024-05-15 DIAGNOSIS — Z801 Family history of malignant neoplasm of trachea, bronchus and lung: Secondary | ICD-10-CM | POA: Insufficient documentation

## 2024-05-15 DIAGNOSIS — Z923 Personal history of irradiation: Secondary | ICD-10-CM | POA: Insufficient documentation

## 2024-05-15 DIAGNOSIS — C50412 Malignant neoplasm of upper-outer quadrant of left female breast: Secondary | ICD-10-CM | POA: Diagnosis present

## 2024-05-15 DIAGNOSIS — Z79811 Long term (current) use of aromatase inhibitors: Secondary | ICD-10-CM | POA: Diagnosis not present

## 2024-05-15 DIAGNOSIS — K219 Gastro-esophageal reflux disease without esophagitis: Secondary | ICD-10-CM | POA: Insufficient documentation

## 2024-05-15 DIAGNOSIS — Z170 Estrogen receptor positive status [ER+]: Secondary | ICD-10-CM | POA: Insufficient documentation

## 2024-05-15 DIAGNOSIS — Z8041 Family history of malignant neoplasm of ovary: Secondary | ICD-10-CM | POA: Diagnosis not present

## 2024-05-15 DIAGNOSIS — Z85828 Personal history of other malignant neoplasm of skin: Secondary | ICD-10-CM | POA: Insufficient documentation

## 2024-05-15 DIAGNOSIS — M25569 Pain in unspecified knee: Secondary | ICD-10-CM | POA: Diagnosis not present

## 2024-05-15 DIAGNOSIS — Z800 Family history of malignant neoplasm of digestive organs: Secondary | ICD-10-CM | POA: Diagnosis not present

## 2024-05-15 LAB — CBC WITH DIFFERENTIAL (CANCER CENTER ONLY)
Abs Immature Granulocytes: 0.02 K/uL (ref 0.00–0.07)
Basophils Absolute: 0 K/uL (ref 0.0–0.1)
Basophils Relative: 1 %
Eosinophils Absolute: 0.3 K/uL (ref 0.0–0.5)
Eosinophils Relative: 5 %
HCT: 39.6 % (ref 36.0–46.0)
Hemoglobin: 13.7 g/dL (ref 12.0–15.0)
Immature Granulocytes: 0 %
Lymphocytes Relative: 28 %
Lymphs Abs: 1.9 K/uL (ref 0.7–4.0)
MCH: 31.3 pg (ref 26.0–34.0)
MCHC: 34.6 g/dL (ref 30.0–36.0)
MCV: 90.4 fL (ref 80.0–100.0)
Monocytes Absolute: 0.5 K/uL (ref 0.1–1.0)
Monocytes Relative: 7 %
Neutro Abs: 3.9 K/uL (ref 1.7–7.7)
Neutrophils Relative %: 59 %
Platelet Count: 253 K/uL (ref 150–400)
RBC: 4.38 MIL/uL (ref 3.87–5.11)
RDW: 11.8 % (ref 11.5–15.5)
WBC Count: 6.6 K/uL (ref 4.0–10.5)
nRBC: 0 % (ref 0.0–0.2)

## 2024-05-15 LAB — CMP (CANCER CENTER ONLY)
ALT: 17 U/L (ref 0–44)
AST: 24 U/L (ref 15–41)
Albumin: 4.4 g/dL (ref 3.5–5.0)
Alkaline Phosphatase: 82 U/L (ref 38–126)
Anion gap: 6 (ref 5–15)
BUN: 13 mg/dL (ref 6–20)
CO2: 29 mmol/L (ref 22–32)
Calcium: 9.7 mg/dL (ref 8.9–10.3)
Chloride: 104 mmol/L (ref 98–111)
Creatinine: 0.94 mg/dL (ref 0.44–1.00)
GFR, Estimated: >60 mL/min (ref >60)
Glucose, Bld: 96 mg/dL (ref 70–99)
Potassium: 4 mmol/L (ref 3.5–5.1)
Sodium: 139 mmol/L (ref 135–145)
Total Bilirubin: 0.4 mg/dL (ref 0.0–1.2)
Total Protein: 7.2 g/dL (ref 6.5–8.1)

## 2024-05-15 NOTE — Progress Notes (Unsigned)
 [facility] Cancer Follow up:    Wanda Toribio MATSU, MD [address] [place] [address]   DIAGNOSIS: Cancer Staging  Malignant neoplasm of upper-outer quadrant of left breast in female, estrogen receptor positive (HCC) Staging form: Breast, AJCC 8th Edition - Clinical: Stage IA (cT1a, cN0, cM0, G1, ER+, PR+, HER2: Equivocal) - Unsigned Stage prefix: Initial diagnosis Method of lymph node assessment: Clinical Histologic grading system: 3 grade system - Pathologic stage from 09/05/2021: Stage IA (pT1a, pN0, cM0, G1, ER+, PR+, HER2-) - Signed by Lanny Callander, MD on 11/07/2021 Stage prefix: Initial diagnosis Histologic grading system: 3 grade system Residual tumor (R): R0    SUMMARY OF ONCOLOGIC HISTORY: Oncology History Overview Note   Cancer Staging  Malignant neoplasm of upper-outer quadrant of left breast in female, estrogen receptor positive (HCC) Staging form: Breast, AJCC 8th Edition - Clinical: Stage IA (cT1a, cN0, cM0, G1, ER+, PR+, HER2: Equivocal) - Unsigned - Pathologic stage from 09/05/2021: Stage IA (pT1a, pN0, cM0, G1, ER+, PR+, HER2-) - Signed by Lanny Callander, MD on 11/07/2021    Malignant neoplasm of upper-outer quadrant of left breast in female, estrogen receptor positive (HCC)  07/31/2021 Imaging   CLINICAL DATA:  Screening recall for a left breast asymmetry.   EXAM: DIGITAL DIAGNOSTIC UNILATERAL LEFT MAMMOGRAM WITH TOMOSYNTHESIS AND CAD; ULTRASOUND LEFT BREAST LIMITED  IMPRESSION: 1. There is an indeterminate 3 mm mass in the left breast at 2 o'clock.   2.  No evidence of left axillary lymphadenopathy.   08/04/2021 Initial Biopsy   Diagnosis Breast, left, needle core biopsy, UOQ, 2 o'clock, 3cmfn, ribbon clip - INVASIVE DUCTAL CARCINOMA - DUCTAL CARCINOMA IN SITU - SEE COMMENT  Microscopic Comment Based on the biopsy, the carcinoma appears Nottingham grade 1 of 3 and measures 0.5 cm in greatest linear extent.  PROGNOSTIC  INDICATORS Results: The tumor cells are EQUIVOCAL for Her2 (2+). Her2 by FISH will be performed and results reported separately. Estrogen Receptor: 100%, POSITIVE, STRONG STAINING INTENSITY Progesterone Receptor: 60%, POSITIVE, STRONG STAINING INTENSITY Proliferation Marker Ki67: 5%   08/11/2021 Initial Diagnosis   Malignant neoplasm of upper-outer quadrant of left breast in female, estrogen receptor positive (HCC)    Genetic Testing   Update 05/04/24: RAD51C gene (p.I52L) was reclassified to benign. Amended report date is 05/01/2024.  [employer] Custom[employer] Panel identified a single pathogenic variant in the CDKN2A gene. Of note, a variant of uncertain significance was detected in the RAD51C gene ( p.I52L). Report date is 08/27/2021.  [employer] CustomNext-Cancer+RNAinsight panel offered by [employer] includes sequencing and rearrangement analysis for the following 52 genes:  APC, ATM, AXIN2, BAP1, BARD1, BMPR1A, BRCA1, BRCA2, BRIP1, CDH1, CDK4, CDKN2A, CHEK2, CTNNA1, DICER1, EPCAM, FH, FLCN, GREM1, HOXB13, KIT, MEN1, MET, MITF, MLH1, MSH2, MSH3, MSH6, MUTYH, NBN, NF1, NTHL1, PALB2, PDGFRA, PMS2, POLD1, POLE, PTEN, RAD50, RAD51C, RAD51D, SDHA, SDHB, SDHC, SDHD, SMAD4, SMARCA4, STK11, TP53, TSC1, TSC2, and VHL.  RNA data is routinely analyzed for use in variant interpretation for all genes.    09/05/2021 Cancer Staging   Staging form: Breast, AJCC 8th Edition - Pathologic stage from 09/05/2021: Stage IA (pT1a, pN0, cM0, G1, ER+, PR+, HER2-) - Signed by Lanny Callander, MD on 11/07/2021 Stage prefix: Initial diagnosis Histologic grading system: 3 grade system Residual tumor (R): R0 - None   09/05/2021 Definitive Surgery   FINAL MICROSCOPIC DIAGNOSIS:   A. LYMPH NODE, LEFT AXILLARY #1, SENTINEL, EXCISION:  -  No carcinoma identified in one lymph node (0/1)   B.  LYMPH NODE, LEFT AXILLARY, SENTINEL, EXCISION:  -  No carcinoma identified in one lymph node (0/1)   C. LYMPH NODE, LEFT AXILLARY, SENTINEL,  EXCISION:  -  No carcinoma identified in one lymph node (0/1)   D. LYMPH NODE, LEFT AXILLARY, SENTINEL, EXCISION:  -  No carcinoma identified in one lymph node (0/1)   E. BREAST, LEFT, LUMPECTOMY:  -  Invasive ductal carcinoma, Nottingham grade 1 of 3, 0.3 cm  -  Ductal carcinoma in-situ, low-grade  -  Calcifications associated with carcinoma  -  Margins uninvolved by carcinoma (0.25 cm; superior margin; see part F for final margin status)  -  Previous biopsy site changes present  -  See oncology table and comment below   F. BREAST, LEFT SUPERIOR AND DEEP MARGIN, EXCISION:  -  No residual carcinoma identified   G. BREAST, LEFT LATERAL MARGIN, EXCISION:  -  No residual carcinoma identified    10/13/2021 - 11/07/2021 Radiation Therapy   Site Technique Total Dose (Gy) Dose per Fx (Gy) Completed Fx Beam Energies  Breast, Left: Breast_L 3D 42.56/42.56 2.66 16/16 6XFFF  Breast, Left: Breast_L_Bst 3D 8/8 2 4/4 6X     11/2021 -  Anti-estrogen oral therapy   Anastrozole      CURRENT THERAPY:  INTERVAL HISTORY:  Discussed the use of AI scribe software for clinical note transcription with the patient, who gave verbal consent to proceed.  Wanda Castro 58 y.o. female returns for    Patient Active Problem List   Diagnosis Date Noted  . Erosive gastritis 12/09/2023  . Screening for cancer 12/09/2023  . Special screening for cancer 10/23/2023  . Colon cancer screening 10/23/2023  . Impacted cerumen of both ears 10/04/2023  . Heart murmur 05/05/2022  . Monoallelic mutation of CDKN2A gene 09/08/2021  . Genetic testing 08/25/2021  . Family history of colon cancer 08/13/2021  . Family history of ovarian cancer 08/13/2021  . Malignant neoplasm of upper-outer quadrant of left breast in female, estrogen receptor positive (HCC) 08/11/2021  . Hypertensive disorder 06/21/2019  . Migraine 06/21/2019    is allergic to adhesive [tape].  MEDICAL HISTORY: Past Medical History:  Diagnosis  Date  . Anxiety   . Breast cancer (HCC)    left  . GERD (gastroesophageal reflux disease)   . Heart murmur   . Hypertension   . Migraine   . Personal history of radiation therapy   . PONV (postoperative nausea and vomiting)   . Right ureteral stone   . Wears contact lenses     SURGICAL HISTORY: Past Surgical History:  Procedure Laterality Date  . BASAL CELL CARCINOMA EXCISION     face  . BREAST BIOPSY Left 08/04/2021  . BREAST LUMPECTOMY Left 09/05/2021  . BREAST LUMPECTOMY WITH RADIOACTIVE SEED AND SENTINEL LYMPH NODE BIOPSY Left 09/05/2021   Procedure: LEFT BREAST LUMPECTOMY WITH RADIOACTIVE SEED AND SENTINEL LYMPH NODE BIOPSY;  Surgeon: Curvin Deward MOULD, MD;  Location: [facility];  Service: General;  Laterality: Left;  . COLONOSCOPY  2015 approx  . ESOPHAGOGASTRODUODENOSCOPY N/A 12/09/2023   Procedure: EGD (ESOPHAGOGASTRODUODENOSCOPY);  Surgeon: Wilhelmenia Aloha Raddle., MD;  Location: [facility];  Service: Gastroenterology;  Laterality: N/A;  . EUS N/A 12/09/2023   Procedure: ULTRASOUND, UPPER GI TRACT, ENDOSCOPIC;  Surgeon: Wilhelmenia, Aloha Raddle., MD;  Location: [facility];  Service: Gastroenterology;  Laterality: N/A;    SOCIAL HISTORY: Social History   Socioeconomic History  . Marital status: Single    Spouse name: Not on file  .  Number of children: 0  . Years of education: Not on file  . Highest education level: Not on file  Occupational History  . Occupation: She works in administration in [employer]  Tobacco Use  . Smoking status: Never  . Smokeless tobacco: Never  Substance and Sexual Activity  . Alcohol use: No  . Drug use: No  . Sexual activity: Not on file  Other Topics Concern  . Not on file  Social History Narrative  . Not on file   Social Drivers of Health   Financial Resource Strain: Not on file  Food Insecurity: Not on file  Transportation Needs: Not on file  Physical Activity: Not on file  Stress: Not on file   Social Connections: Not on file  Intimate Partner Violence: Not on file    FAMILY HISTORY: Family History  Problem Relation Age of Onset  . Atrial fibrillation Mother   . COPD Mother   . Hypertension Father   . Renal cancer Father 3  . Diabetes Sister   . Diabetes Brother   . Colon cancer Maternal Uncle 55  . Lung cancer Paternal Aunt        small cell lung cancer  . Prostate cancer Paternal Uncle   . Prostate cancer Paternal Uncle   . Ovarian cancer Cousin 56       maternal first cousin  . Lung cancer Cousin        paternal first cousin  . Lung cancer Cousin        paternal first cousin  . Uterine cancer Cousin        paternal first cousin, dx. 30s  . Ovarian cancer Cousin        dx. 30s  . Colon cancer Cousin        paternal first cousin, dx. 28s  . Esophageal cancer Cousin   . Inflammatory bowel disease Neg Hx   . Liver disease Neg Hx   . Pancreatic cancer Neg Hx   . Stomach cancer Neg Hx   . Rectal cancer Neg Hx     Review of Systems - Oncology    PHYSICAL EXAMINATION   Onc Performance Status - 05/15/24 1422       ECOG Perf Status   ECOG Perf Status Fully active, able to carry on all pre-disease performance without restriction (P)       KPS SCALE   KPS % SCORE Able to carry on normal activity, minor s/s of disease (P)           Vitals:   05/15/24 1420  BP: (!) 150/78  Pulse: 86  Resp: 16  Temp: 98.5 F (36.9 C)  SpO2: 98%    Physical Exam  LABORATORY DATA:  CBC    Component Value Date/Time   WBC 6.6 05/15/2024 1337   WBC 11.2 (H) 11/28/2015 2326   RBC 4.38 05/15/2024 1337   HGB 13.7 05/15/2024 1337   HCT 39.6 05/15/2024 1337   PLT 253 05/15/2024 1337   MCV 90.4 05/15/2024 1337   MCH 31.3 05/15/2024 1337   MCHC 34.6 05/15/2024 1337   RDW 11.8 05/15/2024 1337   LYMPHSABS 1.9 05/15/2024 1337   MONOABS 0.5 05/15/2024 1337   EOSABS 0.3 05/15/2024 1337   BASOSABS 0.0 05/15/2024 1337    CMP     Component Value Date/Time   NA  139 05/15/2024 1337   K 4.0 05/15/2024 1337   CL 104 05/15/2024 1337   CO2 29 05/15/2024 1337  GLUCOSE 96 05/15/2024 1337   BUN 13 05/15/2024 1337   CREATININE 0.94 05/15/2024 1337   CALCIUM 9.7 05/15/2024 1337   PROT 7.2 05/15/2024 1337   ALBUMIN 4.4 05/15/2024 1337   AST 24 05/15/2024 1337   ALT 17 05/15/2024 1337   ALKPHOS 82 05/15/2024 1337   BILITOT 0.4 05/15/2024 1337   GFRNONAA >60 05/15/2024 1337   GFRAA >60 11/28/2015 2326     ASSESSMENT and THERAPY PLAN:   No problem-specific Assessment & Plan notes found for this encounter.     All questions were answered. The patient knows to call the clinic with any problems, questions or concerns. We can certainly see the patient much sooner if necessary.  Total encounter time:*** minutes*in face-to-face visit time, chart review, lab review, care coordination, order entry, and documentation of the encounter time.    Morna Kendall, NP 05/15/24 2:31 PM Medical Oncology and Hematology [facility] [address] [place], [place] [address] Tel. [phone]    Fax. [phone]  *Total Encounter Time as defined by the Centers for Medicare and Medicaid Services includes, in addition to the face-to-face time of a patient visit (documented in the note above) non-face-to-face time: obtaining and reviewing outside history, ordering and reviewing medications, tests or procedures, care coordination (communications with other health care professionals or caregivers) and documentation in the medical record.

## 2024-05-31 ENCOUNTER — Ambulatory Visit: Admitting: Family Medicine

## 2024-06-06 ENCOUNTER — Encounter: Payer: Self-pay | Admitting: Adult Health

## 2024-06-26 ENCOUNTER — Ambulatory Visit (INDEPENDENT_AMBULATORY_CARE_PROVIDER_SITE_OTHER): Admitting: Otolaryngology

## 2024-06-26 ENCOUNTER — Encounter (INDEPENDENT_AMBULATORY_CARE_PROVIDER_SITE_OTHER): Payer: Self-pay | Admitting: Otolaryngology

## 2024-06-26 VITALS — BP 122/79 | HR 55 | Temp 97.9°F

## 2024-06-26 DIAGNOSIS — H6123 Impacted cerumen, bilateral: Secondary | ICD-10-CM

## 2024-06-26 NOTE — Progress Notes (Signed)
 Patient ID: Wanda Castro, female   DOB: 05/26/1966, 58 y.o.   MRN: [id number]  Follow-up: Recurrent cerumen impaction  Procedure: Bilateral cerumen disimpaction.   Indication: Cerumen impaction, resulting in ear discomfort and conductive hearing loss.   Description: The patient is placed supine on the operating table. Under the operating microscope, the right ear canal is examined and is noted to be impacted with cerumen. The cerumen is carefully removed with a combination of suction catheters, cerumen curette, and alligator forceps. After the cerumen removal, the ear canal and tympanic membrane are noted to be normal. No middle ear effusion is noted. The same procedure is then repeated on the left side without exception. The patient tolerated the procedure well.  Follow-up care: The patient will follow up in 9 months.

## 2024-07-19 ENCOUNTER — Encounter: Payer: Self-pay | Admitting: Adult Health

## 2024-07-19 ENCOUNTER — Ambulatory Visit
Admission: RE | Admit: 2024-07-19 | Discharge: 2024-07-19 | Disposition: A | Discharge status: Home / Self Care | Source: Ambulatory Visit | Attending: Adult Health | Admitting: Adult Health

## 2024-07-19 DIAGNOSIS — Z170 Estrogen receptor positive status [ER+]: Secondary | ICD-10-CM

## 2024-07-19 MED ORDER — IOPAMIDOL (ISOVUE-370) INJECTION 76%
100.0000 mL | Freq: Once | INTRAVENOUS | Status: AC | PRN
  Administered 2024-07-19: 100 mL via INTRAVENOUS

## 2024-07-23 ENCOUNTER — Other Ambulatory Visit: Payer: Self-pay | Admitting: Nurse Practitioner

## 2024-09-18 ENCOUNTER — Ambulatory Visit

## 2024-11-13 ENCOUNTER — Inpatient Hospital Stay: Attending: Adult Health | Admitting: Adult Health

## 2024-11-13 ENCOUNTER — Inpatient Hospital Stay
